# Patient Record
Sex: Male | Born: 1935 | Race: White | Hispanic: No | Smoking: Never smoker
Health system: Southern US, Community
[De-identification: ages and names within clinical notes are randomized; demographics above are authoritative.]

## PROBLEM LIST (undated history)

## (undated) DIAGNOSIS — E785 Hyperlipidemia, unspecified: Secondary | ICD-10-CM

## (undated) DIAGNOSIS — M869 Osteomyelitis, unspecified: Secondary | ICD-10-CM

## (undated) DIAGNOSIS — J189 Pneumonia, unspecified organism: Secondary | ICD-10-CM

## (undated) DIAGNOSIS — I639 Cerebral infarction, unspecified: Secondary | ICD-10-CM

## (undated) DIAGNOSIS — N183 Chronic kidney disease, stage 3 unspecified: Secondary | ICD-10-CM

## (undated) DIAGNOSIS — Z9889 Other specified postprocedural states: Secondary | ICD-10-CM

## (undated) DIAGNOSIS — Z13820 Encounter for screening for osteoporosis: Secondary | ICD-10-CM

## (undated) DIAGNOSIS — M1711 Unilateral primary osteoarthritis, right knee: Secondary | ICD-10-CM

## (undated) DIAGNOSIS — M51369 Other intervertebral disc degeneration, lumbar region without mention of lumbar back pain or lower extremity pain: Secondary | ICD-10-CM

## (undated) DIAGNOSIS — I701 Atherosclerosis of renal artery: Secondary | ICD-10-CM

## (undated) DIAGNOSIS — I739 Peripheral vascular disease, unspecified: Secondary | ICD-10-CM

## (undated) DIAGNOSIS — R0602 Shortness of breath: Secondary | ICD-10-CM

## (undated) DIAGNOSIS — F419 Anxiety disorder, unspecified: Secondary | ICD-10-CM

## (undated) DIAGNOSIS — I350 Nonrheumatic aortic (valve) stenosis: Secondary | ICD-10-CM

## (undated) DIAGNOSIS — I10 Essential (primary) hypertension: Secondary | ICD-10-CM

## (undated) DIAGNOSIS — D649 Anemia, unspecified: Secondary | ICD-10-CM

## (undated) DIAGNOSIS — M5136 Other intervertebral disc degeneration, lumbar region: Secondary | ICD-10-CM

## (undated) DIAGNOSIS — I4891 Unspecified atrial fibrillation: Secondary | ICD-10-CM

## (undated) DIAGNOSIS — R011 Cardiac murmur, unspecified: Secondary | ICD-10-CM

## (undated) DIAGNOSIS — G4730 Sleep apnea, unspecified: Secondary | ICD-10-CM

## (undated) HISTORY — DX: Osteomyelitis, unspecified: M86.9

## (undated) HISTORY — DX: Sleep apnea, unspecified: G47.30

## (undated) HISTORY — PX: SHOULDER SURGERY: SHX246

## (undated) HISTORY — PX: CARDIAC CATHETERIZATION: SHX172

## (undated) HISTORY — PX: LUMBAR SPINE SURGERY: SHX701

## (undated) HISTORY — DX: Unilateral primary osteoarthritis, right knee: M17.11

## (undated) HISTORY — DX: Atherosclerosis of renal artery: I70.1

## (undated) HISTORY — DX: Cerebral infarction, unspecified: I63.9

## (undated) HISTORY — DX: Other specified postprocedural states: Z98.890

## (undated) HISTORY — DX: Other intervertebral disc degeneration, lumbar region: M51.36

## (undated) HISTORY — DX: Nonrheumatic aortic (valve) stenosis: I35.0

## (undated) HISTORY — PX: TONSILLECTOMY: SUR1361

## (undated) HISTORY — DX: Chronic kidney disease, stage 3 (moderate): N18.3

## (undated) HISTORY — DX: Hyperlipidemia, unspecified: E78.5

## (undated) HISTORY — DX: Chronic kidney disease, stage 3 unspecified: N18.30

## (undated) HISTORY — DX: Encounter for screening for osteoporosis: Z13.820

## (undated) HISTORY — PX: APPENDECTOMY: SHX54

## (undated) HISTORY — DX: Peripheral vascular disease, unspecified: I73.9

## (undated) HISTORY — DX: Essential (primary) hypertension: I10

## (undated) HISTORY — DX: Other intervertebral disc degeneration, lumbar region without mention of lumbar back pain or lower extremity pain: M51.369

## (undated) HISTORY — DX: Unspecified atrial fibrillation: I48.91

---

## 1991-06-01 HISTORY — PX: THROAT SURGERY: SHX803

## 2003-11-03 ENCOUNTER — Other Ambulatory Visit: Payer: Self-pay

## 2005-04-07 ENCOUNTER — Ambulatory Visit: Payer: Self-pay | Admitting: Rheumatology

## 2005-04-15 ENCOUNTER — Ambulatory Visit: Payer: Self-pay | Admitting: *Deleted

## 2005-05-11 ENCOUNTER — Ambulatory Visit: Payer: Self-pay | Admitting: *Deleted

## 2005-10-29 DIAGNOSIS — Z9889 Other specified postprocedural states: Secondary | ICD-10-CM

## 2005-10-29 HISTORY — DX: Other specified postprocedural states: Z98.890

## 2005-11-23 ENCOUNTER — Ambulatory Visit: Payer: Self-pay | Admitting: Gastroenterology

## 2008-04-15 ENCOUNTER — Ambulatory Visit: Payer: Self-pay | Admitting: Family Medicine

## 2009-12-08 ENCOUNTER — Encounter: Admission: RE | Admit: 2009-12-08 | Discharge: 2009-12-08 | Payer: Self-pay | Admitting: Orthopaedic Surgery

## 2010-05-31 DIAGNOSIS — I639 Cerebral infarction, unspecified: Secondary | ICD-10-CM

## 2010-05-31 HISTORY — DX: Cerebral infarction, unspecified: I63.9

## 2010-07-01 DIAGNOSIS — Z13820 Encounter for screening for osteoporosis: Secondary | ICD-10-CM

## 2010-07-01 HISTORY — DX: Encounter for screening for osteoporosis: Z13.820

## 2010-10-03 ENCOUNTER — Emergency Department (HOSPITAL_COMMUNITY): Payer: Medicare Other

## 2010-10-03 ENCOUNTER — Inpatient Hospital Stay (HOSPITAL_COMMUNITY)
Admission: EM | Admit: 2010-10-03 | Discharge: 2010-10-08 | DRG: 065 | Disposition: A | Discharge status: Skilled Nursing Facility | Payer: Medicare Other | Attending: Neurology | Admitting: Neurology

## 2010-10-03 DIAGNOSIS — E669 Obesity, unspecified: Secondary | ICD-10-CM | POA: Diagnosis present

## 2010-10-03 DIAGNOSIS — E119 Type 2 diabetes mellitus without complications: Secondary | ICD-10-CM | POA: Diagnosis present

## 2010-10-03 DIAGNOSIS — N183 Chronic kidney disease, stage 3 unspecified: Secondary | ICD-10-CM | POA: Diagnosis present

## 2010-10-03 DIAGNOSIS — I739 Peripheral vascular disease, unspecified: Secondary | ICD-10-CM | POA: Diagnosis present

## 2010-10-03 DIAGNOSIS — M51379 Other intervertebral disc degeneration, lumbosacral region without mention of lumbar back pain or lower extremity pain: Secondary | ICD-10-CM | POA: Diagnosis present

## 2010-10-03 DIAGNOSIS — M5137 Other intervertebral disc degeneration, lumbosacral region: Secondary | ICD-10-CM | POA: Diagnosis present

## 2010-10-03 DIAGNOSIS — I359 Nonrheumatic aortic valve disorder, unspecified: Secondary | ICD-10-CM | POA: Diagnosis present

## 2010-10-03 DIAGNOSIS — I4891 Unspecified atrial fibrillation: Secondary | ICD-10-CM | POA: Diagnosis present

## 2010-10-03 DIAGNOSIS — I6529 Occlusion and stenosis of unspecified carotid artery: Secondary | ICD-10-CM | POA: Diagnosis present

## 2010-10-03 DIAGNOSIS — I129 Hypertensive chronic kidney disease with stage 1 through stage 4 chronic kidney disease, or unspecified chronic kidney disease: Secondary | ICD-10-CM | POA: Diagnosis present

## 2010-10-03 DIAGNOSIS — E870 Hyperosmolality and hypernatremia: Secondary | ICD-10-CM | POA: Diagnosis present

## 2010-10-03 DIAGNOSIS — I6340 Cerebral infarction due to embolism of unspecified cerebral artery: Principal | ICD-10-CM | POA: Diagnosis present

## 2010-10-03 DIAGNOSIS — Z7982 Long term (current) use of aspirin: Secondary | ICD-10-CM

## 2010-10-03 DIAGNOSIS — E785 Hyperlipidemia, unspecified: Secondary | ICD-10-CM | POA: Diagnosis present

## 2010-10-03 LAB — CBC
Hemoglobin: 11.5 g/dL — ABNORMAL LOW (ref 13.0–17.0)
MCV: 93.8 fL (ref 78.0–100.0)
Platelets: 281 10*3/uL (ref 150–400)
RDW: 15.3 % (ref 11.5–15.5)
WBC: 10.1 10*3/uL (ref 4.0–10.5)

## 2010-10-03 LAB — COMPREHENSIVE METABOLIC PANEL
AST: 15 U/L (ref 0–37)
Alkaline Phosphatase: 86 U/L (ref 39–117)
BUN: 52 mg/dL — ABNORMAL HIGH (ref 6–23)
CO2: 24 mEq/L (ref 19–32)
GFR calc Af Amer: 37 mL/min — ABNORMAL LOW (ref >60)
Potassium: 4.1 mEq/L (ref 3.5–5.1)
Total Bilirubin: 0.4 mg/dL (ref 0.3–1.2)
Total Protein: 7.7 g/dL (ref 6.0–8.3)

## 2010-10-03 LAB — POCT I-STAT, CHEM 8
BUN: 53 mg/dL — ABNORMAL HIGH (ref 6–23)
Chloride: 110 mEq/L (ref 96–112)
Creatinine, Ser: 2.5 mg/dL — ABNORMAL HIGH (ref 0.4–1.5)
Glucose, Bld: 82 mg/dL (ref 70–99)
HCT: 37 % — ABNORMAL LOW (ref 39.0–52.0)
Potassium: 4.1 mEq/L (ref 3.5–5.1)

## 2010-10-03 LAB — DIFFERENTIAL
Basophils Relative: 1 % (ref 0–1)
Eosinophils Absolute: 0.2 10*3/uL (ref 0.0–0.7)
Eosinophils Relative: 2 % (ref 0–5)
Lymphs Abs: 3.6 10*3/uL (ref 0.7–4.0)
Monocytes Relative: 8 % (ref 3–12)

## 2010-10-03 LAB — TROPONIN I: Troponin I: <0.3 ng/mL (ref <0.30)

## 2010-10-03 LAB — PROTIME-INR
INR: 0.99 (ref 0.00–1.49)
Prothrombin Time: 13.3 seconds (ref 11.6–15.2)

## 2010-10-03 LAB — CK TOTAL AND CKMB (NOT AT ARMC)
CK, MB: 1.9 ng/mL (ref 0.3–4.0)
Relative Index: INVALID (ref 0.0–2.5)

## 2010-10-04 ENCOUNTER — Inpatient Hospital Stay (HOSPITAL_COMMUNITY): Payer: Medicare Other

## 2010-10-04 DIAGNOSIS — I359 Nonrheumatic aortic valve disorder, unspecified: Secondary | ICD-10-CM

## 2010-10-04 LAB — URINALYSIS, ROUTINE W REFLEX MICROSCOPIC
Glucose, UA: NEGATIVE mg/dL
Protein, ur: 30 mg/dL — AB
Specific Gravity, Urine: 1.009 (ref 1.005–1.030)
pH: 6 (ref 5.0–8.0)

## 2010-10-04 LAB — GLUCOSE, CAPILLARY
Glucose-Capillary: 106 mg/dL — ABNORMAL HIGH (ref 70–99)
Glucose-Capillary: 103 mg/dL — ABNORMAL HIGH (ref 70–99)
Glucose-Capillary: 110 mg/dL — ABNORMAL HIGH (ref 70–99)

## 2010-10-04 LAB — LIPID PANEL
LDL Cholesterol: 163 mg/dL — ABNORMAL HIGH (ref 0–99)
Total CHOL/HDL Ratio: 5.8 RATIO
Triglycerides: 162 mg/dL — ABNORMAL HIGH (ref <150)

## 2010-10-04 LAB — URINE MICROSCOPIC-ADD ON

## 2010-10-04 LAB — HEMOGLOBIN A1C
Hgb A1c MFr Bld: 6.9 % — ABNORMAL HIGH (ref <5.7)
Mean Plasma Glucose: 151 mg/dL — ABNORMAL HIGH (ref <117)

## 2010-10-05 ENCOUNTER — Inpatient Hospital Stay (HOSPITAL_COMMUNITY): Payer: Medicare Other

## 2010-10-05 ENCOUNTER — Other Ambulatory Visit (HOSPITAL_COMMUNITY): Payer: Self-pay

## 2010-10-05 DIAGNOSIS — I4891 Unspecified atrial fibrillation: Secondary | ICD-10-CM

## 2010-10-05 DIAGNOSIS — I6340 Cerebral infarction due to embolism of unspecified cerebral artery: Secondary | ICD-10-CM

## 2010-10-05 LAB — CBC
HCT: 34.9 % — ABNORMAL LOW (ref 39.0–52.0)
Hemoglobin: 10.9 g/dL — ABNORMAL LOW (ref 13.0–17.0)
MCH: 29.6 pg (ref 26.0–34.0)
MCHC: 31.2 g/dL (ref 30.0–36.0)
MCV: 94.8 fL (ref 78.0–100.0)

## 2010-10-05 LAB — GLUCOSE, CAPILLARY

## 2010-10-06 ENCOUNTER — Inpatient Hospital Stay (HOSPITAL_COMMUNITY): Payer: Medicare Other

## 2010-10-06 ENCOUNTER — Other Ambulatory Visit (HOSPITAL_COMMUNITY): Payer: Self-pay

## 2010-10-06 LAB — BASIC METABOLIC PANEL
BUN: 34 mg/dL — ABNORMAL HIGH (ref 6–23)
BUN: 28 mg/dL — ABNORMAL HIGH (ref 6–23)
CO2: 23 mEq/L (ref 19–32)
Calcium: 9.2 mg/dL (ref 8.4–10.5)
Creatinine, Ser: 1.6 mg/dL — ABNORMAL HIGH (ref 0.4–1.5)
Creatinine, Ser: 1.61 mg/dL — ABNORMAL HIGH (ref 0.4–1.5)
GFR calc Af Amer: 51 mL/min — ABNORMAL LOW (ref >60)
GFR calc non Af Amer: 42 mL/min — ABNORMAL LOW (ref >60)
Glucose, Bld: 106 mg/dL — ABNORMAL HIGH (ref 70–99)
Glucose, Bld: 187 mg/dL — ABNORMAL HIGH (ref 70–99)

## 2010-10-06 LAB — GLUCOSE, CAPILLARY
Glucose-Capillary: 182 mg/dL — ABNORMAL HIGH (ref 70–99)
Glucose-Capillary: 241 mg/dL — ABNORMAL HIGH (ref 70–99)

## 2010-10-06 LAB — PROTIME-INR
INR: 1.31 (ref 0.00–1.49)
Prothrombin Time: 16.5 seconds — ABNORMAL HIGH (ref 11.6–15.2)

## 2010-10-07 LAB — GLUCOSE, CAPILLARY: Glucose-Capillary: 175 mg/dL — ABNORMAL HIGH (ref 70–99)

## 2010-10-08 ENCOUNTER — Encounter: Payer: Self-pay | Admitting: Internal Medicine

## 2010-10-08 LAB — PROTIME-INR
INR: 1.55 — ABNORMAL HIGH (ref 0.00–1.49)
Prothrombin Time: 18.8 seconds — ABNORMAL HIGH (ref 11.6–15.2)

## 2010-10-08 LAB — BASIC METABOLIC PANEL
BUN: 27 mg/dL — ABNORMAL HIGH (ref 6–23)
Chloride: 120 mEq/L — ABNORMAL HIGH (ref 96–112)
GFR calc non Af Amer: 43 mL/min — ABNORMAL LOW (ref >60)
Potassium: 4 mEq/L (ref 3.5–5.1)
Sodium: 152 mEq/L — ABNORMAL HIGH (ref 135–145)

## 2010-10-08 LAB — GLUCOSE, CAPILLARY
Glucose-Capillary: 165 mg/dL — ABNORMAL HIGH (ref 70–99)
Glucose-Capillary: 165 mg/dL — ABNORMAL HIGH (ref 70–99)
Glucose-Capillary: 116 mg/dL — ABNORMAL HIGH (ref 70–99)

## 2010-10-15 NOTE — Consult Note (Signed)
NAME:  Scott Norman, Scott Norman NO.:  [id number]  MEDICAL RECORD NO.:  [id number]           PATIENT TYPE:  I  LOCATION:  3020                         FACILITY:  [facility]  PHYSICIAN:  Marca Ancona, MD      DATE OF BIRTH:  28-Jun-1930  DATE OF CONSULTATION:  10/05/2010 DATE OF DISCHARGE:                                CONSULTATION   PRIMARY CARE PHYSICIAN:  Dr. Gwen Pounds at [facility] in [place], phone number [phone].  CARDIOLOGIST:  Dr. Gwen Pounds.  CHIEF COMPLAINT:  Atrial fib/flutter.  HISTORY OF PRESENT ILLNESS:  Scott Norman is a 77 year old male with a history of atrial fibrillation.  He was admitted as a code stroke on Oct 03, 2010, and received TPA.  He had some improvement in his symptoms.  On Oct 04, 2010, he was noted to go into atrial flutter and went back into sinus rhythm this a.m.  Cardiology was asked to evaluate him.  [name] is awake and alert.  He is not able to communicate well as he cannot consistently answer yes or no questions correctly.  He is able to move all of his extremities.  He gets frustrated with his inability to communicate.  Information was obtained from records as no family is present at this time.  PAST MEDICAL HISTORY: 1. History of atrial flutter, maintaining sinus rhythm when Dr.     Gwen Pounds saw him on July 24, 2010. 2. Moderate aortic stenosis. 3. Diabetes. 4. Hypertension. 5. Peripheral vascular disease with carotid atherosclerosis. 6. Degenerative joint disease. 7. Obesity. 8. Hyperlipidemia. 9. Chronic kidney disease, stage III.  SURGICAL HISTORY:  He has had shoulder surgery and was scheduled for back surgery, but cancelled this.  ALLERGIES:  He has been allergic or intolerant to ACE INHIBITORS with renal insufficiency as well as BETA-BLOCKERS, which caused bradycardia, and STATINS with no specific reaction given.  CURRENT MEDICATIONS: 1. Aspirin 300 mg p.r. daily. 2. Clonidine patch 0.1 weekly. 3.  DVT Lovenox. 4. Normal saline at 100 mL an hour.  SOCIAL HISTORY:  Lives in [place] with his wife.  He is self employed.  He has no history of alcohol, tobacco, or drug abuse noted.  FAMILY HISTORY:  Both of his parents are deceased and per Dr. Dr. Philemon Kingdom note, there is no family history of premature coronary artery disease.  REVIEW OF SYSTEMS:  Unobtainable secondary to the patient's condition.  PHYSICAL EXAMINATION:  VITAL SIGNS:  Temperature is 99.3, blood pressure 175/56, pulse 78, respiratory rate 21, O2 saturation 98% on room air. GENERAL:  He is a well-developed, well-nourished white male who moves all four extremities and responds to verbal stimulus. HEENT:  Normal with the exception of a possible minimal right facial droop. NECK:  There is no lymphadenopathy, thyromegaly, or JVD noted and he has a loud carotid bruit. CV:  His heart is regular in rate and rhythm with an S1 and S2 and 2 or 3/6 systolic murmur is noted.  Distal pulses are intact in all four extremities. LUNGS:  He has a few rales in the bases, but they are generally clear. SKIN:  No rashes  or lesions are noted. ABDOMEN:  Soft and nontender with active bowel sounds. EXTREMITIES:  There is no cyanosis, clubbing, or edema noted. MUSCULOSKELETAL:  There is no joint deformity or effusions. NEURO:  He is alert and appears able to respond to questions, but answers are not consistently accurate.  Strength is equal bilaterally.  Chest x-ray on a Oct 03, 2010, showed mild bibasilar atelectasis.  CT of the head shows an infarct in the left MCA distribution with no hemorrhage.  EKG is sinus rhythm, rate 72 with frequent PVCs and right bundle-branch block as well as a significant first-degree AV block at 296 milliseconds.  LABORATORY VALUES:  Hemoglobin 10.9, hematocrit 34.9, WBC is 10.3, platelets 257.  Sodium 143, potassium 3.2, chloride 109, CO2 of 23, BUN 34, creatinine 1.6, glucose 106.  Hemoglobin A1c  6.9.  CK-MB and troponin-I negative x1.  Total cholesterol 236, triglycerides 162, HDL 41, LDL 163.  2-D echocardiogram showed an EF 55% to 60% with mild AS and MR, valve area of 0.72 cm2 by V-max, mean gradient 23, peak gradient 39, mild mitral valvular regurgitation with a peak gradient of 9 mm and PAS of 38.  IMPRESSION:  Scott Norman was seen today by Dr. Shirlee Latch, the patient evaluated and the data reviewed.  He is status post t-PA for acute cerebrovascular accident who is now aphasic.  We suspect a cardioembolic cerebrovascular accident from atrial fibrillation. 1. Atrial fibrillation:  It is paroxysmal.  The rate is not fast.     Given his long PR interval at baseline and some evidence for sinus     node exit block with dropped beat, we would avoid nodal blockade     for now. 2. Anticoagulation:  He should be started on Coumadin. 3. Cerebrovascular accident:  We suspect cardioembolic and he also has     left greater than right carotid bruits.  Per Dr. Dr. Philemon Kingdom     records, Dopplers performed in 2010 showed less than 50% bilateral     internal carotid artery stenosis.  Carotid Dopplers have been     ordered and we will follow up on the results. 4. Hypertension:  The neuro team was contacted and they feel it is     appropriate to restart his amlodipine at 5 mg daily and discontinue     the clonidine, which has the potential to cause bradycardia.     Systolic blood pressure goals are to be addressed by the neuro     team. 5. Hyperlipidemia:  His goal LDL is now less than 70.  Per Dr. Dr.     Philemon Kingdom records, he had intolerance to     statins previously with weakness and fatigue.  We will start with     low-dose Zocor at 10 mg daily and see how he tolerates it. 6. Aortic stenosis:  It is mild by echo and we will continue to follow     him for this.     Theodore Demark, PA-C   ______________________________ Marca Ancona, MD    RB/MEDQ  D:  10/05/2010  T:   10/05/2010  Job:  [id number]  Electronically Signed by Theodore Demark PA-C on 10/09/2010 03:45:22 PM Electronically Signed by Marca Ancona MD on 10/15/2010 11:49:09 PM

## 2010-10-28 NOTE — Discharge Summary (Signed)
[name], [name]                ACCOUNT NO.:  [id number]  MEDICAL RECORD NO.:  [id number]           PATIENT TYPE:  I  LOCATION:  3020                         FACILITY:  [facility]  PHYSICIAN:  Pramod P. Pearlean Brownie, MD    DATE OF BIRTH:  April 22, 1931  DATE OF ADMISSION:  10/03/2010 DATE OF DISCHARGE:  10/08/2010                        DISCHARGE SUMMARY - REFERRING   DIAGNOSES AT TIME OF DISCHARGE: 1. Left MCA branch infarct cardioembolic secondary to atrial     fibrillation status post full-dose IV t-PA. 2. Atrial fibrillation, new Coumadin, not yet therapeutic. 3. Hypertension. 4. Hypernatremia. 5. Chronic kidney disease stage III. 6. Moderate aortic stenosis. 7. Diabetes. 8. Peripheral vascular disease with carotid atherosclerosis. 9. Degenerative joint disease. 10.Obesity. 11.Hyperlipidemia. 12.Degenerative disease of lumbar spine, underwent back surgery about     6 weeks ago and apparently has done well.  MEDICINES AT TIME OF DISCHARGE: 1. Amlodipine 10 mg a day. 2. Aspirin 325 mg a day until INR greater than or equal to 2 then     stop. 3. Metoprolol 25 mg b.i.d. 4. Simvastatin 20 mg a day. 5. Warfarin 5 mg a day.  Adjust dose for goal INR 2.0-3.0. 6. Allopurinol 300 mg a day. 7. Clonidine 0.1 mg at bedtime. 8. Colchicine 0.6 mg a day. 9. Lasix 20 mg b.i.d. 10.Gemfibrozil 600 mg b.i.d. 11.Glipizide 10 mg 1 tablet a day.  STUDIES PERFORMED: 1. CT of the brain on admission showed no acute abnormality.  Mild     diffuse cerebral atrophy.  Follow up CT 24 hours after t-PA shows     changes consistent with evolving acute infarct in the distribution     of the left middle cerebral artery.  No hemorrhage. 2. MRI of the brain not performed.  The patient unable to cooperate. 3. CT angio of the brain not performed, the patient unable to     cooperate. 4. Chest x-ray shows low lung volumes and mild bibasilar atelectasis. 5. A 2-D echocardiogram shows EF of 55-60% with moderate  stenosis,     aortic valve. 6. Carotid Doppler shows no ICA stenosis. 7. Transcranial Doppler shows elevated left middle cerebral mean flow     velocity suggest mild stenosis.  Globally elevated pulsatility     indices suggest diffuse intracranial atherosclerosis. 8. EKG shows sinus rhythm with first-degree AV block with frequent     PVCs.  Right axis deviation.  Nonspecific ventricular conduction     block.  LABORATORY STUDIES:  Day of discharge; sodium 152, potassium 4.0, chloride 120, CO2 of 18, glucose 159, BUN 27, creatinine 1.56, GFR 43, calcium 10.0, INR 1.55 with a PT of 18.8.  CBC with hemoglobin 10.9, hematocrit 34.9, otherwise normal.  Hemoglobin A1c 6.9.  Cholesterol 236, triglycerides 162, HDL 41, LDL 163.  Urinalysis with 3-6 white blood cell, 0-2 red blood cells, rare squamous epithelial, rare bacteria.  MRSA screening was negative and cardiac enzymes on admission were normal.  HISTORY OF PRESENT ILLNESS:  [name] is a 77 year old right- handed Caucasian male with a history of diabetes and hypertension, who was noticed by his wife at 7:30 p.m.  to appear confused.  The patient was unable to speak.  There was a slight droop on the right side of his face, but no obvious weakness of his arm or legs.  The patient was able to strap on his back brace with no difficulty.  There was no indication he had difficulty walking.  His glucose was 82.  EMS was called and brought to [facility].  His NIH stroke scale was 5.  CT of the brain showed no acute abnormality.  The patient was deemed to be a candidate for t-PA and he was given full-dose IV t-PA.  He had immediate improvement in spoken commands, but decreased output.  He was admitted to the neuro ICU for further evaluation.  HOSPITAL COURSE:  The patient was able to tolerate t-PA without difficulty.  Follow up CT at 24 hours showed no acute hemorrhage, but did show a left brain stroke.  The patient with  significant receptive and expressive aphasia noted as well as swallowing difficulty.  He was placed on a dysphagia I honey-thick liquid diet.  In hospital, the patient converted to atrial flutter, as he was sinus rhythm on admission.  In looking at the patient's history, the patient does have a history of atrial flutter, though not present on a recent monitoring. He is followed by cardiology.  He has been on Coumadin in the past, but this was stopped as there was no indication for warfarin use.  As he has now had a stroke and atrial fibrillation, the patient was resumed on Coumadin.  Cardiology was consulted.  They feel he may benefit from cardioversion in the future.  They are agreeable with Coumadin for secondary stroke prevention and ongoing blood pressure management.  Low- dose beta-blocker was added by them and they increased amlodipine.  At the time of discharge, discussed case with Dr. Antoine Poche and will resume Lasix at half the dose at discharge, as he has abnormal electrolytes at the time of discharge.  We will ask skilled nursing facility to follow up lab and medication tolerance.  The patient was evaluated by PT, OT, and speech therapy.  The patient was too high level for inpatient rehab at [facility].  Initial plans were to discharge the patient home with the patient's wife with home health therapies.  The patient's children were concerned with plan. Social worker met with family and decision was made to discharge the patient to skilled nursing facility for ongoing therapy with further care determination to be made at that time.  Of note, the patient's wife is not the mother of the children.  CONDITION AT TIME OF DISCHARGE:  The patient is alert.  He is aphasic and essentially mute.  He has no ability to follow commands on a consistent basis.  He has no weakness in his extremities and is able to walk with the therapist with his back brace on.  DISCHARGE PLAN: 1. Discharge  to [facility] for ongoing PT, OT, and speech therapy. 2. Dysphagia I honey-thick liquid diet. 3. Coumadin for secondary stroke prevention.  Goal INR 2.0-3.0.  The     patient needs daily PT/INR checks until therapeutic. 4. Aspirin 325 mg daily as a bridge to Coumadin.  Once INR     therapeutic, please stop Coumadin. 5. Chemistry in 7 days to evaluate potassium, sodium, and creatinine. 6. Ongoing monitoring/evaluation/treatment with the patient's     cardiologist and primary care physician in [place]. 7. Follow up with Dr. Delia Heady in his office  in 1-2 months.     Annie Main, N.P.   ______________________________ Sunny Schlein. Pearlean Brownie, MD    SB/MEDQ  D:  10/08/2010  T:  10/08/2010  Job:  [id number]  cc:   Pramod P. Pearlean Brownie, MD [facility] Cardiology Jonna L. Robb Matar, M.D. Dr. Gwen Pounds  Electronically Signed by Annie Main N.P. on 10/15/2010 11:26:37 AM Electronically Signed by Delia Heady MD on 10/28/2010 09:44:02 PM

## 2010-10-29 ENCOUNTER — Ambulatory Visit: Payer: Self-pay | Admitting: Internal Medicine

## 2010-10-30 ENCOUNTER — Encounter: Payer: Self-pay | Admitting: Internal Medicine

## 2010-11-16 ENCOUNTER — Encounter: Payer: Self-pay | Admitting: Family Medicine

## 2010-11-20 ENCOUNTER — Encounter: Payer: Self-pay | Admitting: Cardiology

## 2010-11-20 ENCOUNTER — Ambulatory Visit (INDEPENDENT_AMBULATORY_CARE_PROVIDER_SITE_OTHER): Payer: Medicare Other | Admitting: Cardiology

## 2010-11-20 DIAGNOSIS — R0602 Shortness of breath: Secondary | ICD-10-CM

## 2010-11-20 DIAGNOSIS — I350 Nonrheumatic aortic (valve) stenosis: Secondary | ICD-10-CM

## 2010-11-20 DIAGNOSIS — I10 Essential (primary) hypertension: Secondary | ICD-10-CM

## 2010-11-20 DIAGNOSIS — I4891 Unspecified atrial fibrillation: Secondary | ICD-10-CM

## 2010-11-20 DIAGNOSIS — I4430 Unspecified atrioventricular block: Secondary | ICD-10-CM

## 2010-11-20 DIAGNOSIS — N189 Chronic kidney disease, unspecified: Secondary | ICD-10-CM

## 2010-11-20 DIAGNOSIS — I359 Nonrheumatic aortic valve disorder, unspecified: Secondary | ICD-10-CM

## 2010-11-20 DIAGNOSIS — E785 Hyperlipidemia, unspecified: Secondary | ICD-10-CM

## 2010-11-20 DIAGNOSIS — R001 Bradycardia, unspecified: Secondary | ICD-10-CM

## 2010-11-20 LAB — BASIC METABOLIC PANEL
BUN: 40 mg/dL — ABNORMAL HIGH (ref 6–23)
Chloride: 108 mEq/L (ref 96–112)
Potassium: 4.2 mEq/L (ref 3.5–5.3)
Sodium: 142 mEq/L (ref 135–145)

## 2010-11-20 MED ORDER — HYDRALAZINE HCL 25 MG PO TABS: 37.5000 mg | ORAL_TABLET | Freq: Three times a day (TID) | ORAL | Status: DC

## 2010-11-20 MED ORDER — OMEGA-3 FATTY ACIDS 1000 MG PO CAPS: 2.0000 g | ORAL_CAPSULE | Freq: Every day | ORAL | Status: DC

## 2010-11-20 NOTE — Patient Instructions (Signed)
Your physician has recommended you make the following change in your medication: STOP Coreg. START Hydralazine 25mg  1 1/2 tablets 3 times a day. START Fish oil 1000mg  2 tab daily.  TO BE SCHEDULED MAY 2013: Your physician has requested that you have an echocardiogram. Echocardiography is a painless test that uses sound waves to create images of your heart. It provides your doctor with information about the size and shape of your heart and how well your heart's chambers and valves are working. This procedure takes approximately one hour. There are no restrictions for this procedure.  Your physician recommends that you return for a FASTING lipid profile: Lipid/Lft in 1 month  We will call you with results of labwork done today.   Your physician has recommended that you wear a holter monitor. Holter monitors are medical devices that record the heart's electrical activity. Doctors most often use these monitors to diagnose arrhythmias. Arrhythmias are problems with the speed or rhythm of the heartbeat. The monitor is a small, portable device. You can wear one while you do your normal daily activities. This is usually used to diagnose what is causing palpitations/syncope (passing out).  Your physician has requested that you regularly monitor and record your blood pressure readings at home. Please use the same machine at the same time of day to check your readings and record them to bring to your follow-up visit.

## 2010-11-21 DIAGNOSIS — E785 Hyperlipidemia, unspecified: Secondary | ICD-10-CM | POA: Insufficient documentation

## 2010-11-21 DIAGNOSIS — N189 Chronic kidney disease, unspecified: Secondary | ICD-10-CM | POA: Insufficient documentation

## 2010-11-21 DIAGNOSIS — I350 Nonrheumatic aortic (valve) stenosis: Secondary | ICD-10-CM | POA: Insufficient documentation

## 2010-11-21 DIAGNOSIS — R001 Bradycardia, unspecified: Secondary | ICD-10-CM | POA: Insufficient documentation

## 2010-11-21 DIAGNOSIS — I10 Essential (primary) hypertension: Secondary | ICD-10-CM | POA: Insufficient documentation

## 2010-11-21 DIAGNOSIS — I4891 Unspecified atrial fibrillation: Secondary | ICD-10-CM | POA: Insufficient documentation

## 2010-11-21 LAB — BRAIN NATRIURETIC PEPTIDE: Brain Natriuretic Peptide: 906.4 pg/mL — ABNORMAL HIGH (ref 0.0–100.0)

## 2010-11-21 NOTE — Assessment & Plan Note (Signed)
LDL was 163 in 5/12.  He was started on simvastatin in the hospital.  Will get lipids/LFTs in 1 month.

## 2010-11-21 NOTE — Assessment & Plan Note (Signed)
Will check BMET today  

## 2010-11-21 NOTE — Assessment & Plan Note (Signed)
BP is high today.  He needs good BP control.  Options are limited: holding off on ACEI/ARB with chronic kidney disease (last creatinine 1.56).  Holding off on nodal blockers with bradycardia/heart block.  He is on amlodipine 10 mg daily and clonidine 0.1 mg qhs.  I would like to avoid titrating up clonidine if possible.  I will instead start him on hydralazine 37.5 mg tid.  This can be titrated up as needed. We will call him for a blood pressure check in 2 wks.

## 2010-11-21 NOTE — Assessment & Plan Note (Signed)
Mild bradycardia with profound 1st degree AV block and RBBB.  Significant conduction system disease.  No symptomatic bradycardia.  I will get a 48 hour holter monitor to assess for any long pauses, episodes of higher degree heart block, etc.

## 2010-11-21 NOTE — Assessment & Plan Note (Signed)
Paroxysmal atrial fibrillation.  He is in sinus rhythm today.  Given profound 1st degree AV block as well as RBBB, I am going to have him stop carvedilol.  He will remain on coumadin (will avoid rivaroxaban and dabigatran given chronic kidney disease).  If he needs to come off coumadin for any reason in the future, he will need bridging heparin or Lovenox given history of stroke.

## 2010-11-21 NOTE — Assessment & Plan Note (Signed)
Mild to moderate aortic stenosis.  Repeat echo in 5/13.

## 2010-11-21 NOTE — Progress Notes (Signed)
PCP: Dr. Terance Hart  Unfortunate 77 yo with history of paroxysmal atrial fibrillation, CKD, and mild to moderate aortic stenosis presents to establish cardiology care after recent hospitalization for large cardioembolic stroke.  Patient was admitted to [facility] in 5/12 with a left MCA cardioembolic stroke.  He was treated with tPA.  He was noted to be in atrial fibrillation on and off while in the hospital.  When in sinus rhythm, he had a profound 1st degree AV block.  He had profound expressive aphasia as a result of the stroke.  He was discharged to rehab initially but is back home now.  He has been noted to have atrial fibrillation in the past and at some point was on coumadin but had been told he could stop it.  He is back on coumadin now.    He still has expressive aphasia but has had some improvement.   Patient walks with a cane.  He denies exertional dyspnea walking on flat ground or climbing a flight of steps.  No chest pain.  Today, he is in sinus rhythm with a profound 1st degree AV block.  He denies lightheadedness or syncope.   ECG: NSR, RBBB, 1st degree AV block at 51, PVC  Labs (5/12): K 4.0, Na 152, creatinine 1.56, LDL 163  PMH: 1. Gout 2. HTN 3. Diabetes mellitus type II 4. Hyperlipidemia 5. Atrial fibrillation: paroxysmal.  Echo (5/12) with EF 55-60%, moderate LV hypertrophy, mild to moderate AS (mean gradient 23/peak 39), mild mitral regurgitation, PA systolic pressure 38 mmHg.  Patient had a L MCA cardioembolic stroke while not anticoagulated.  6. Left MCA cardioembolic stroke (4/62): In setting of atrial fibrillation.  Prominent aphasia.  Was not anticoagulated at the time.  7. CKD: History of ARF with ACEI.  8. Aortic stenosis: Mild to moderate on 5/12 echo.  9. Degenerative disc disease s/p L-spine surgery.  10. Carotid stenosis: However, carotid dopplers in 5/12 did not show significant disease.    SH: Lives with wife in [place].  No ETOH or tobacco.  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    FH: No premature CAD.   ROS: All systems reviewed and negative except as per HPI.   Current Outpatient Prescriptions  Medication Sig Dispense Refill  . allopurinol (ZYLOPRIM) 300 MG tablet Take 300 mg by mouth daily.        Marland Kitchen amLODipine (NORVASC) 10 MG tablet Take 10 mg by mouth daily.        . cloNIDine (CATAPRES) 0.1 MG tablet Take 0.1 mg by mouth at bedtime.        . colchicine 0.6 MG tablet Take 0.6 mg by mouth daily.        . diclofenac sodium (VOLTAREN) 1 % GEL Apply 1 application topically 4 (four) times daily.        . furosemide (LASIX) 20 MG tablet Take 20 mg by mouth 2 (two) times daily.        Marland Kitchen gemfibrozil (LOPID) 600 MG tablet Take 600 mg by mouth 2 (two) times daily before a meal.        . glipiZIDE (GLUCOTROL) 10 MG 24 hr tablet Take 5 mg by mouth daily.       . simvastatin (ZOCOR) 20 MG tablet Take 20 mg by mouth at bedtime.        Marland Kitchen warfarin (COUMADIN) 5 MG tablet Take 5 mg by mouth daily. Adjust dose for goal INR 2.0-3.0       .  fish oil-omega-3 fatty acids 1000 MG capsule Take 2 capsules (2 g total) by mouth daily.  30 capsule  0  . hydrALAZINE (APRESOLINE) 25 MG tablet Take 1.5 tablets (37.5 mg total) by mouth 3 (three) times daily.  120 tablet  6    BP 149/70  Pulse 57  Ht 6' (1.829 m)  Wt 224 lb (101.606 kg)  BMI 30.38 kg/m2 General: NAD Neck: JVP 7-8 cm, no thyromegaly or thyroid nodule.  Lungs: Clear to auscultation bilaterally with normal respiratory effort. CV: Nondisplaced PMI.  Heart regular S1/S2, no S3/S4, no murmur.  1+ edema 3/4 up bilaterally.  No carotid bruit.  Normal pedal pulses.  Abdomen: Soft, nontender, no hepatosplenomegaly, no distention.  Neurologic: Alert and oriented x 3.  Psych: Normal affect. Extremities: No clubbing or cyanosis.

## 2010-11-24 ENCOUNTER — Other Ambulatory Visit: Payer: Self-pay | Admitting: Cardiology

## 2010-11-24 ENCOUNTER — Telehealth: Payer: Self-pay | Admitting: *Deleted

## 2010-11-24 DIAGNOSIS — R001 Bradycardia, unspecified: Secondary | ICD-10-CM

## 2010-11-24 DIAGNOSIS — I350 Nonrheumatic aortic (valve) stenosis: Secondary | ICD-10-CM

## 2010-11-24 DIAGNOSIS — N189 Chronic kidney disease, unspecified: Secondary | ICD-10-CM

## 2010-11-24 DIAGNOSIS — I4430 Unspecified atrioventricular block: Secondary | ICD-10-CM

## 2010-11-24 NOTE — Telephone Encounter (Signed)
He was on hydralazine and lasix both prior to last appointment.  I did not make much change in the overall doses.  It is possible that he could have a gout flare given use of Lasix.  Is his knee red/warm? Usually statin causes muscle and not joint pain.  He can try coenzyme  Q 10 200 mg daily to see if it helps.  If pain continues, needs to have knees evaluated.

## 2010-11-24 NOTE — Telephone Encounter (Signed)
Spoke to pt's daughter, gave her pt's lab results and notified of incr in lasix from 20bid to 40 once daily. Pt will return in 10 days for repeat BMP. She stated that pt has c/o severe joint pain (mostly in knees) and is asking if this could be related to Hydralazine. Pt was started on hydralazine 25mg  1 1/2 tab tid at last ov and she states this is when symptoms began. Pt is also on Simvastatin 20mg  daily, she thinks pt started this around May 2012. Notified her that I was not aware of Hydralazine causing joint pain, could possibly be his simva, will forward to Dr. Shirlee Latch for recommendation.

## 2010-11-25 ENCOUNTER — Other Ambulatory Visit: Payer: Self-pay | Admitting: Cardiology

## 2010-11-25 ENCOUNTER — Encounter: Payer: Self-pay | Admitting: *Deleted

## 2010-11-25 DIAGNOSIS — N189 Chronic kidney disease, unspecified: Secondary | ICD-10-CM

## 2010-11-25 DIAGNOSIS — I350 Nonrheumatic aortic (valve) stenosis: Secondary | ICD-10-CM

## 2010-11-25 NOTE — Telephone Encounter (Signed)
Spoke to pt's daughter, she found out that it was Crestor, not lipitor that caused problem in past. Pt will try CoQ10 first, then if continues to have knee pain, will hold simva x 1 week and will call back with results. Will follow instructions below and pt is scheduled to have bloodwork next week.

## 2010-11-25 NOTE — Telephone Encounter (Signed)
Spoke to pt's daughter, she states that pt's knees are not red or warm to touch and he has been taking Allopurinol and Colchicine for a while now. She will have pt see pcp for gout eval if pain worsens. Pt has a history of problems with statins, Dr. Terance Hart put him on Lipitor in past and pt could barely walk, and symptoms improved after stopping Lipitor. Pt's daughter states she will have him try CoQ 10, but would like to know if we can change to different med with his history? Please advise.  Pt's daughter asking if pt is safe to take Septra, given by pcp for skin boil. Advised that pt make sure they are watching his INR closely during and after taking since he is on Coumadin. Dr. Terance Hart follows this, and daughter would like for [place] to start seeing him for INR checks. Advised that pt have next check with PCP since on abx and will set him up for next visit here for new coumadin pt. She is ok with this and will call back to schedule.

## 2010-11-25 NOTE — Telephone Encounter (Signed)
Would try coq10 1st.  If this does not help, stop simva x 1 week to see if symptoms better.  If symptoms better, would start pravastatin 40 mg daily with coq10 (prava is less likely to cause pain but is not as strong).

## 2010-11-29 ENCOUNTER — Encounter: Payer: Self-pay | Admitting: Family Medicine

## 2010-11-29 ENCOUNTER — Encounter: Payer: Self-pay | Admitting: Internal Medicine

## 2010-11-30 ENCOUNTER — Telehealth: Payer: Self-pay | Admitting: *Deleted

## 2010-11-30 NOTE — Telephone Encounter (Signed)
Received fax today from daughter. BP readings from 6/25- 160/83, 6/26 144/67,160/72, 6/27 @0830  49/79, @1200  126/61, 6/28 115/69, 112/55, 6/30  0800 76/45, 5:10PM 144/64, 7/1 115/61,7/2 104/56.

## 2010-11-30 NOTE — Telephone Encounter (Signed)
Spoke to pt's daughter this AM to get BP numbers for past week. She will call later or tomorrow with numbers when she has his list. Pt will f/u this Fri for labs. Daughter did say that he did have one reading of 76/59, and one episode of pt not "feeling well," and she is wondering if his BP machine is accurate, as he uses the wrist machine. Advised daughter to bring that machine in Friday and we will check accuracy, and to call us sooner if BP continues to be low and if his symptoms change, or continues to "feel bad."   Also, pt did hold simva for 1 week and his pain in knees has completely resolved. Per note advised we start Pravastatin. Daughter wants to wait for chol results first then decide; pt is also taking fish oil 2g/day and Lopid 600mg  bid; he did not ever start CoQ10.   Daughter also wanted Dr. Shirlee Latch to review pt's medlist to determine if everything is necessary as Dr. Dareen Piano was the one who started him on most of his meds. Pt just had ov with DM and notified daughter would send msg, but he would have reviewed at visit, and/or can discuss any meds at his f/u visit 12/2010. She is ok with this.  Daughter will be in contact with me about starting coumadin visits here, she has requested records to be sent here. Dr. Terance Hart is following at this time and monitoring closer now while pt is on Sulfa.

## 2010-12-02 NOTE — Telephone Encounter (Signed)
Would not change his BP meds at this time.  Suspect that some of the readings (the extremely low ones) are likely spurious.  Would start pravastatin with coenzyme Q10 200 mg daily as advised before regardless of cholesterol value as we are trying to keep plaque out of his arteries.  Would be fine to monitor coumadin at our office.

## 2010-12-03 ENCOUNTER — Other Ambulatory Visit: Payer: Self-pay | Admitting: Cardiology

## 2010-12-03 MED ORDER — PRAVASTATIN SODIUM 40 MG PO TABS: 40.0000 mg | ORAL_TABLET | Freq: Every evening | ORAL | Status: DC

## 2010-12-03 MED ORDER — COENZYME Q10 200 MG PO TABS: 200.0000 mg | ORAL_TABLET | Freq: Every day | ORAL | Status: DC

## 2010-12-03 NOTE — Telephone Encounter (Signed)
Spoke to pt's daughter, Ophelia Shoulder, notified of msg below. She will have pt change to Pravastatin 40mg  daily along with CoQ10 200mg  daily. Pt will call with any problems regarding side effects. Pt due to come in tomorrow AM for labs, and we will schedule pt for coumadin clinic for the future. We will also check pt's BP along with his machine tomorrow.

## 2010-12-04 ENCOUNTER — Other Ambulatory Visit: Payer: Medicare Other | Admitting: *Deleted

## 2010-12-04 ENCOUNTER — Encounter: Payer: Self-pay | Admitting: *Deleted

## 2010-12-04 ENCOUNTER — Other Ambulatory Visit (INDEPENDENT_AMBULATORY_CARE_PROVIDER_SITE_OTHER): Payer: Medicare Other | Admitting: *Deleted

## 2010-12-04 VITALS — BP 162/50 | HR 62

## 2010-12-04 DIAGNOSIS — N189 Chronic kidney disease, unspecified: Secondary | ICD-10-CM

## 2010-12-04 DIAGNOSIS — Z7901 Long term (current) use of anticoagulants: Secondary | ICD-10-CM

## 2010-12-04 DIAGNOSIS — E785 Hyperlipidemia, unspecified: Secondary | ICD-10-CM

## 2010-12-04 DIAGNOSIS — I4891 Unspecified atrial fibrillation: Secondary | ICD-10-CM

## 2010-12-04 NOTE — Progress Notes (Signed)
Pt's BP today was checked along with his own wrist BP cuff he uses at home. His cuff gave results of 136/68, manual taken by RN was 162/50. Advised pt get an arm cuff because his is off by over 30points. Pt's daughter with him and states she will do this.

## 2010-12-07 ENCOUNTER — Other Ambulatory Visit: Payer: Self-pay | Admitting: Cardiology

## 2010-12-07 DIAGNOSIS — E785 Hyperlipidemia, unspecified: Secondary | ICD-10-CM

## 2010-12-10 ENCOUNTER — Telehealth: Payer: Self-pay | Admitting: Cardiovascular Disease

## 2010-12-10 NOTE — Telephone Encounter (Signed)
Spoke to pt's daughter, she says it is only one knee and he is going to have checked by pcp. Just wanted to notify Dr. Shirlee Latch that pt has never started pravastatin 40 after we suggested he switch from simva. Pt's daughter said he refuses to take any more statins regardless of recommendation. Pt had labs drawn at pcp today, we should receive results tomorrow. Just FYI.

## 2010-12-10 NOTE — Telephone Encounter (Signed)
Statin is making that patients knees sore.

## 2010-12-11 ENCOUNTER — Telehealth: Payer: Self-pay | Admitting: *Deleted

## 2010-12-11 DIAGNOSIS — R7989 Other specified abnormal findings of blood chemistry: Secondary | ICD-10-CM

## 2010-12-11 NOTE — Telephone Encounter (Signed)
Attempted to contact pt and pt's daughter regarding lab results. Creat up from 2.26 to 3.2. He is taking lasix 40mg  daily. His potassium is 5.2, I tried to contact his daughter (she monitors his meds) to find out if pt is on potassium, I do not see on medlist. Pt's daughter to call back.   Dr. Shirlee Latch, please review labs in pt's chart, I had them scanned but do not think they were sent to you. Thanks.

## 2010-12-13 NOTE — Telephone Encounter (Signed)
He needs to stop Lasix.  Repeat BMET in a week.

## 2010-12-14 ENCOUNTER — Telehealth: Payer: Self-pay | Admitting: *Deleted

## 2010-12-14 DIAGNOSIS — N189 Chronic kidney disease, unspecified: Secondary | ICD-10-CM | POA: Insufficient documentation

## 2010-12-14 NOTE — Telephone Encounter (Signed)
Spoke to pt's daughter, [name], notified to have pt hold Lasix. He has taken today, so he will hold tomorrow. Pt was at acute care on Friday when I was trying to contact pt, daughter said he was having problems with low blood sugar. Pt's daughter notified that this could be r/t his elevated K+, he does not take supplement, also recommended that he decr any in his diet as well. Pt will return in 1 week for repeat BMET.

## 2010-12-16 ENCOUNTER — Other Ambulatory Visit: Payer: Medicare Other | Admitting: *Deleted

## 2010-12-16 ENCOUNTER — Ambulatory Visit (INDEPENDENT_AMBULATORY_CARE_PROVIDER_SITE_OTHER): Payer: Medicare Other | Admitting: Emergency Medicine

## 2010-12-16 DIAGNOSIS — Z7901 Long term (current) use of anticoagulants: Secondary | ICD-10-CM

## 2010-12-16 DIAGNOSIS — I4891 Unspecified atrial fibrillation: Secondary | ICD-10-CM

## 2010-12-16 DIAGNOSIS — I639 Cerebral infarction, unspecified: Secondary | ICD-10-CM | POA: Insufficient documentation

## 2010-12-17 NOTE — Telephone Encounter (Signed)
Opened in error

## 2010-12-21 ENCOUNTER — Ambulatory Visit (INDEPENDENT_AMBULATORY_CARE_PROVIDER_SITE_OTHER): Payer: Medicare Other | Admitting: Cardiovascular Disease

## 2010-12-21 ENCOUNTER — Encounter: Payer: Self-pay | Admitting: Cardiovascular Disease

## 2010-12-21 ENCOUNTER — Other Ambulatory Visit: Payer: Medicare Other | Admitting: *Deleted

## 2010-12-21 VITALS — BP 162/70 | HR 66 | Ht 72.0 in | Wt 236.0 lb

## 2010-12-21 DIAGNOSIS — I350 Nonrheumatic aortic (valve) stenosis: Secondary | ICD-10-CM

## 2010-12-21 DIAGNOSIS — I4891 Unspecified atrial fibrillation: Secondary | ICD-10-CM

## 2010-12-21 DIAGNOSIS — R609 Edema, unspecified: Secondary | ICD-10-CM

## 2010-12-21 DIAGNOSIS — E785 Hyperlipidemia, unspecified: Secondary | ICD-10-CM

## 2010-12-21 DIAGNOSIS — Z79899 Other long term (current) drug therapy: Secondary | ICD-10-CM

## 2010-12-21 DIAGNOSIS — I10 Essential (primary) hypertension: Secondary | ICD-10-CM

## 2010-12-21 DIAGNOSIS — R0602 Shortness of breath: Secondary | ICD-10-CM

## 2010-12-21 DIAGNOSIS — I359 Nonrheumatic aortic valve disorder, unspecified: Secondary | ICD-10-CM

## 2010-12-21 DIAGNOSIS — N189 Chronic kidney disease, unspecified: Secondary | ICD-10-CM

## 2010-12-21 MED ORDER — CLONIDINE HCL 0.2 MG PO TABS: 0.2000 mg | ORAL_TABLET | Freq: Every day | ORAL | Status: DC

## 2010-12-21 MED ORDER — HYDRALAZINE HCL 100 MG PO TABS: 100.0000 mg | ORAL_TABLET | Freq: Four times a day (QID) | ORAL | Status: DC

## 2010-12-21 MED ORDER — FUROSEMIDE 80 MG PO TABS: 80.0000 mg | ORAL_TABLET | Freq: Two times a day (BID) | ORAL | Status: DC

## 2010-12-21 NOTE — Assessment & Plan Note (Signed)
Per the patient, his edema is worse. His weight is also elevated concerning for fluid retention in the setting of renal dysfunction. We will increase his Lasix to 80 mg b.i.d. And we'll closely monitor his weight. We will check a basic metabolic panel today as well as BNP.  We will hold his amlodipine as this can contribute to edema and will increase his other blood pressure medications to compensate.

## 2010-12-21 NOTE — Assessment & Plan Note (Signed)
Per the patient, he is unable to tolerate statins. He is on fenofibrate.

## 2010-12-21 NOTE — Assessment & Plan Note (Signed)
Mild-to-moderate aortic valve stenosis per the notes

## 2010-12-21 NOTE — Progress Notes (Signed)
Patient ID: Scott Norman, male    DOB: 05-30-1931, 77 y.o.   MRN: [id number]  HPI Comments: 77 yo with history of paroxysmal atrial fibrillation, CKD, and mild to moderate aortic stenosis, recent hospitalization for large cardioembolic stroke ([place] in 5/12 with a left MCA cardioembolic stroke),   treated with tPA, noted to be in atrial fibrillation  while in the hospital with residual profound expressive aphasia as a result of the stroke, now  on coumadin, Who presents with weight gain, worsening edema and abdominal swelling.  2 weeks ago, he had his blood work evaluated and creatinine was greater than 3. This is a significant change from May when creatinine was 1.56. He has had a weight gain from 215 pounds now to 236 today. Last month, his weight was 224. He does drink significant amount of fluid. He has been taking  Lasix Only the past few days with minimal urine output. He has more abdominal swelling and he reports it feels tight. No significant shortness of breath. He does have chronic lower extremity edema though it is worse now.   Patient walks with a cane.    No chest pain.   He denies lightheadedness or syncope.   ECG: NSR, RBBB, 1st degree AV block, rate of 57, PVCs     Outpatient Encounter Prescriptions as of 12/21/2010  Medication Sig Dispense Refill  . allopurinol (ZYLOPRIM) 300 MG tablet Take 300 mg by mouth daily.        . cloNIDine (CATAPRES) 0.1 MG tablet Take 1 tablet (0.1 mg total) by mouth at bedtime.  60 tablet  6  . Coenzyme Q10 200 MG TABS Take 1 tablet (200 mg total) by mouth daily.  30 tablet  0  . colchicine 0.6 MG tablet Take 0.6 mg by mouth daily.        . diclofenac sodium (VOLTAREN) 1 % GEL Apply 1 application topically 4 (four) times daily.        . fish oil-omega-3 fatty acids 1000 MG capsule Take 2 capsules (2 g total) by mouth daily.  30 capsule  0  . furosemide (LASIX) 80 MG tablet Take 1 tablet (80 mg total) by mouth 2 (two) times daily. As directed   60 tablet  6  . gemfibrozil (LOPID) 600 MG tablet Take 600 mg by mouth 2 (two) times daily before a meal.        . hydrALAZINE (APRESOLINE) 25 MG tablet Take 1 1/2 tablet (37.5 mg total) by mouth 3 (four) times daily.  120 tablet  6  . warfarin (COUMADIN) 5 MG tablet Take 5 mg by mouth daily. Adjust dose for goal INR 2.0-3.0       . glipiZIDE (GLUCOTROL) 10 MG 24 hr tablet Take 5 mg by mouth daily.        Amlodipine 10 mg 10 mg daily    . pravastatin (PRAVACHOL) 40 MG tablet Take 1 tablet (40 mg total) by mouth every evening.  30 tablet  6     Review of Systems  Constitutional: Positive for unexpected weight change.  HENT: Negative.   Eyes: Negative.   Respiratory: Negative.   Cardiovascular: Positive for leg swelling.  Gastrointestinal: Positive for abdominal distention.  Musculoskeletal: Negative.   Skin: Negative.   Neurological: Negative.   Hematological: Negative.   Psychiatric/Behavioral: Negative.   All other systems reviewed and are negative.    BP 162/70  Pulse 66  Ht 6' (1.829 m)  Wt 236 lb (107.049 kg)  BMI 32.01 kg/m2  Physical Exam  Nursing note and vitals reviewed. Constitutional: He is oriented to person, place, and time. He appears well-developed and well-nourished.  HENT:  Head: Normocephalic.  Nose: Nose normal.  Mouth/Throat: Oropharynx is clear and moist.  Eyes: Conjunctivae are normal. Pupils are equal, round, and reactive to light.  Neck: Normal range of motion. Neck supple. No JVD present.  Cardiovascular: Normal rate, regular rhythm, S1 normal, S2 normal, normal heart sounds and intact distal pulses.  Frequent extrasystoles are present. Exam reveals no gallop and no friction rub.   No murmur heard.      1+ pitting edema to below the knee b/l  Pulmonary/Chest: Effort normal and breath sounds normal. No respiratory distress. He has no wheezes. He has no rales. He exhibits no tenderness.  Abdominal: Bowel sounds are normal. He exhibits no distension.  There is no tenderness.       Abdomen feels firm to palpation, possibly consistent with edema.  Musculoskeletal: Normal range of motion. He exhibits edema. He exhibits no tenderness.  Lymphadenopathy:    He has no cervical adenopathy.  Neurological: He is alert and oriented to person, place, and time. Coordination normal.  Skin: Skin is warm and dry. No rash noted. No erythema.  Psychiatric: He has a normal mood and affect. His behavior is normal. Judgment and thought content normal.           Assessment and Plan

## 2010-12-21 NOTE — Assessment & Plan Note (Signed)
Blood pressure is very elevated recently per their numbers. This could be secondary to fluid retention. We will hold amlodipine secondary to swelling. Increase hydralazine to 100 mg q.i.d., increase clonidine to 0.2 mg b.i.d..

## 2010-12-21 NOTE — Assessment & Plan Note (Signed)
He is maintaining normal sinus rhythm on today's visit. Frequent PVCs.

## 2010-12-21 NOTE — Patient Instructions (Addendum)
Please increase the hydralazine to 100 mg Breakfast/Lunch/Dinner (also before bed for blood pressure greater than 140) Please increase the clonidine to 0.2 mg twice a day Please stop the amlodipine Please monitor your weight daily and blood pressure We are going to increase the lasix to 80 mg twice a day (8 am and 2 pm) (weight gain, leg swelling, belly swelling) Follow up in 7 to 10 days with labs We will help schedule an appt with the kidney doctor

## 2010-12-21 NOTE — Assessment & Plan Note (Signed)
We will check a basic metabolic panel today. It made a referral to Dr. Cherylann Ratel and Thedore Mins.

## 2010-12-22 ENCOUNTER — Telehealth: Payer: Self-pay

## 2010-12-22 ENCOUNTER — Other Ambulatory Visit: Payer: Medicare Other | Admitting: *Deleted

## 2010-12-22 ENCOUNTER — Telehealth: Payer: Self-pay | Admitting: Physician Assistant

## 2010-12-22 ENCOUNTER — Other Ambulatory Visit: Payer: Self-pay

## 2010-12-22 DIAGNOSIS — I350 Nonrheumatic aortic (valve) stenosis: Secondary | ICD-10-CM

## 2010-12-22 DIAGNOSIS — I4891 Unspecified atrial fibrillation: Secondary | ICD-10-CM

## 2010-12-22 DIAGNOSIS — R609 Edema, unspecified: Secondary | ICD-10-CM

## 2010-12-22 DIAGNOSIS — R0602 Shortness of breath: Secondary | ICD-10-CM

## 2010-12-22 LAB — BASIC METABOLIC PANEL
CO2: 18 mEq/L — ABNORMAL LOW (ref 19–32)
Chloride: 106 mEq/L (ref 96–112)
Glucose, Bld: 138 mg/dL — ABNORMAL HIGH (ref 70–99)
Potassium: 4.5 mEq/L (ref 3.5–5.3)
Sodium: 138 mEq/L (ref 135–145)

## 2010-12-22 NOTE — Telephone Encounter (Signed)
Notified patient's wife per Dr. Mariah Milling the patient needs to cut back the dose of lasix from 80 mg twice a day to lasix 20 mg twice a day with elevated legs & place an ace wrap up to the knee to help with the fluid circulation.  Also made sure the patient did stop the amlodipine.

## 2010-12-22 NOTE — Telephone Encounter (Signed)
Patient's daughter calling with patient c/o loss of appetite, feeling tired and is Banker.  Per Dr. Mariah Milling patient needs to stay on the lasix 80 mg twice a day with an Echo ASAP.

## 2010-12-22 NOTE — Telephone Encounter (Signed)
Pt dtr called because pt off Lasix for a week 2nd worsening RF and now on a much larger dose. Pt was on 20mg  x 2 qam. Now on 80mg  bid. Not that much UOP after am dose. No specific Sx but he feels bad. Seen by Dr Mariah Milling today. Advised her pt could be taken to ER but no other way to eval him tonight. She did not feel comfortable waiting till am and calling ofc, said she would try to get pt to ER tonight.

## 2010-12-23 ENCOUNTER — Other Ambulatory Visit: Payer: Medicare Other | Admitting: *Deleted

## 2010-12-23 ENCOUNTER — Ambulatory Visit (INDEPENDENT_AMBULATORY_CARE_PROVIDER_SITE_OTHER): Payer: Self-pay

## 2010-12-23 ENCOUNTER — Encounter: Payer: Medicare Other | Admitting: Emergency Medicine

## 2010-12-23 DIAGNOSIS — R0989 Other specified symptoms and signs involving the circulatory and respiratory systems: Secondary | ICD-10-CM

## 2010-12-23 NOTE — Telephone Encounter (Signed)
Would think a patient seen in the office that day would not need to come to the ER that night for a fluid issue. Was this patient admitted? Thanks [employer]

## 2010-12-28 ENCOUNTER — Ambulatory Visit (HOSPITAL_COMMUNITY): Payer: Medicare Other | Attending: Cardiology | Admitting: Radiology

## 2010-12-28 DIAGNOSIS — E785 Hyperlipidemia, unspecified: Secondary | ICD-10-CM | POA: Insufficient documentation

## 2010-12-28 DIAGNOSIS — I359 Nonrheumatic aortic valve disorder, unspecified: Secondary | ICD-10-CM

## 2010-12-28 DIAGNOSIS — I4891 Unspecified atrial fibrillation: Secondary | ICD-10-CM | POA: Insufficient documentation

## 2010-12-28 DIAGNOSIS — Z8673 Personal history of transient ischemic attack (TIA), and cerebral infarction without residual deficits: Secondary | ICD-10-CM | POA: Insufficient documentation

## 2010-12-28 DIAGNOSIS — R609 Edema, unspecified: Secondary | ICD-10-CM | POA: Insufficient documentation

## 2010-12-28 DIAGNOSIS — R0602 Shortness of breath: Secondary | ICD-10-CM

## 2010-12-28 DIAGNOSIS — I350 Nonrheumatic aortic (valve) stenosis: Secondary | ICD-10-CM

## 2010-12-28 DIAGNOSIS — I10 Essential (primary) hypertension: Secondary | ICD-10-CM | POA: Insufficient documentation

## 2010-12-30 ENCOUNTER — Encounter: Payer: Self-pay | Admitting: Cardiovascular Disease

## 2010-12-30 ENCOUNTER — Encounter: Payer: Self-pay | Admitting: Internal Medicine

## 2010-12-30 ENCOUNTER — Encounter: Payer: Self-pay | Admitting: Family Medicine

## 2010-12-30 ENCOUNTER — Ambulatory Visit (INDEPENDENT_AMBULATORY_CARE_PROVIDER_SITE_OTHER): Payer: Medicare Other | Admitting: Cardiovascular Disease

## 2010-12-30 ENCOUNTER — Ambulatory Visit (INDEPENDENT_AMBULATORY_CARE_PROVIDER_SITE_OTHER): Payer: Medicare Other | Admitting: Emergency Medicine

## 2010-12-30 DIAGNOSIS — N189 Chronic kidney disease, unspecified: Secondary | ICD-10-CM

## 2010-12-30 DIAGNOSIS — R609 Edema, unspecified: Secondary | ICD-10-CM

## 2010-12-30 DIAGNOSIS — I359 Nonrheumatic aortic valve disorder, unspecified: Secondary | ICD-10-CM

## 2010-12-30 DIAGNOSIS — Z7901 Long term (current) use of anticoagulants: Secondary | ICD-10-CM

## 2010-12-30 DIAGNOSIS — E785 Hyperlipidemia, unspecified: Secondary | ICD-10-CM

## 2010-12-30 DIAGNOSIS — I350 Nonrheumatic aortic (valve) stenosis: Secondary | ICD-10-CM

## 2010-12-30 DIAGNOSIS — I4891 Unspecified atrial fibrillation: Secondary | ICD-10-CM

## 2010-12-30 DIAGNOSIS — I10 Essential (primary) hypertension: Secondary | ICD-10-CM

## 2010-12-30 LAB — POCT INR: INR: 2.4

## 2010-12-30 LAB — BASIC METABOLIC PANEL
BUN: 67 mg/dL — ABNORMAL HIGH (ref 6–23)
CO2: 24 mEq/L (ref 19–32)
Chloride: 95 mEq/L — ABNORMAL LOW (ref 96–112)
Potassium: 4.2 mEq/L (ref 3.5–5.3)

## 2010-12-30 NOTE — Assessment & Plan Note (Signed)
Mild to moderate aortic valve stenosis on recent echocardiogram and normal LV systolic function.

## 2010-12-30 NOTE — Assessment & Plan Note (Signed)
He has followup in the next several weeks with the renal service.

## 2010-12-30 NOTE — Assessment & Plan Note (Signed)
Blood pressure continues to be elevated. We'll increase his hydralazine to 150 mg t.i.d. And keep his other medications at their current doses. Amlodipine was discontinued previously secondary to leg edema. If needed in the future, we could add amlodipine 5 mg and monitor for edema.

## 2010-12-30 NOTE — Progress Notes (Signed)
Patient ID: Scott Norman, male    DOB: [birth year], 77 y.o.   MRN: [id number]  HPI Comments: 77 yo with history of paroxysmal atrial fibrillation, CKD, and mild to moderate aortic stenosis, recent hospitalization for large cardioembolic stroke ([place] in 5/12 with a left MCA cardioembolic stroke),   treated with tPA, noted to be in atrial fibrillation  while in the hospital with residual profound expressive aphasia as a result of the stroke, now  on coumadin, Who presented with weight gain, worsening edema and abdominal swelling Last week to the clinic, now presented for routine followup.  His weight last week was 236 pounds. Today his weight is 224 pounds. He feels that his legs are significantly better with less swelling, less abdominal distention and fullness. His energy is better. Overall he feels significantly improved over the past week. His blood pressure continues to be elevated with systolic pressures in the 160-170 range.   Patient walks with a cane.    No chest pain.   He denies lightheadedness or syncope.   ECG: NSR, RBBB, 1st degree AV block, rate of 68, PVCs    Outpatient Encounter Prescriptions as of 12/30/2010  Medication Sig Dispense Refill  . allopurinol (ZYLOPRIM) 300 MG tablet Take 300 mg by mouth daily.        . cloNIDine (CATAPRES) 0.2 MG tablet Take 1 tablet (0.2 mg total) by mouth at bedtime.  60 tablet  6  . Coenzyme Q10 200 MG TABS Take 1 tablet (200 mg total) by mouth daily.  30 tablet  0  . colchicine 0.6 MG tablet Take 0.6 mg by mouth daily.        . diclofenac sodium (VOLTAREN) 1 % GEL Apply 1 application topically 4 (four) times daily.        . fish oil-omega-3 fatty acids 1000 MG capsule Take 2 capsules (2 g total) by mouth daily.  30 capsule  0  . furosemide (LASIX) 20 MG tablet Take 20 mg by mouth 2 (two) times daily.        Marland Kitchen gemfibrozil (LOPID) 600 MG tablet Take 600 mg by mouth 2 (two) times daily before a meal.        . glipiZIDE (GLUCOTROL) 10 MG 24 hr  tablet Take 5 mg by mouth daily.       . hydrALAZINE (APRESOLINE) 100 MG tablet Take 1 tablet (100 mg total) by mouth 4 (four) times daily.  120 tablet  6  . pravastatin (PRAVACHOL) 40 MG tablet Take 1 tablet (40 mg total) by mouth every evening.  30 tablet  6  . warfarin (COUMADIN) 5 MG tablet Take 5 mg by mouth daily. Adjust dose for goal INR 2.0-3.0       . furosemide (LASIX) 80 MG tablet Take 1 tablet (80 mg total) by mouth 2 (two) times daily. As directed  60 tablet  6      Review of Systems  Constitutional: Positive for unexpected weight change.  HENT: Negative.   Eyes: Negative.   Respiratory: Negative.   Musculoskeletal: Positive for gait problem.  Skin: Negative.   Neurological: Negative.   Hematological: Negative.   Psychiatric/Behavioral: Negative.   All other systems reviewed and are negative.    BP 160/60  Pulse 68  Ht 6' (1.829 m)  Wt 224 lb (101.606 kg)  BMI 30.38 kg/m2  Physical Exam  Nursing note and vitals reviewed. Constitutional: He is oriented to person, place, and time. He appears well-developed and well-nourished.  HENT:  Head: Normocephalic.  Nose: Nose normal.  Mouth/Throat: Oropharynx is clear and moist.  Eyes: Conjunctivae are normal. Pupils are equal, round, and reactive to light.  Neck: Normal range of motion. Neck supple. No JVD present.  Cardiovascular: Normal rate, regular rhythm, S1 normal, S2 normal, normal heart sounds and intact distal pulses.  Frequent extrasystoles are present. Exam reveals no gallop and no friction rub.   No murmur heard.      Minimal  Edema Around the ankles.  Pulmonary/Chest: Effort normal and breath sounds normal. No respiratory distress. He has no wheezes. He has no rales. He exhibits no tenderness.  Abdominal: Bowel sounds are normal. He exhibits no distension. There is no tenderness.  Musculoskeletal: Normal range of motion. He exhibits no tenderness.  Lymphadenopathy:    He has no cervical adenopathy.    Neurological: He is alert and oriented to person, place, and time. Coordination normal.  Skin: Skin is warm and dry. No rash noted. No erythema.  Psychiatric: He has a normal mood and affect. His behavior is normal. Judgment and thought content normal.           Assessment and Plan

## 2010-12-30 NOTE — Assessment & Plan Note (Signed)
Rhythm appears to be sinus with frequent PVCs. We will check his electrolytes.

## 2010-12-30 NOTE — Patient Instructions (Signed)
Hold fish oil Increase hydralazine to 1 1/2 tabs three times a day  When your weight is less than 218 , do not take any lasix For weight 218 to 224, take lasix  1/2 pill twice a day  (40 mg twice a day) For weight greater than 224, take lasix full pill twice a day (80 mg twice a day) Follow up in one month

## 2010-12-30 NOTE — Assessment & Plan Note (Addendum)
He has mild to moderate aortic valve stenosis, significant LVH, diastolic dysfunction.  This is likely the cause of his pulmonary hypertension and edema. Edema has resolved with significant diuresis and 12 pound weight loss. He feels much better. We will check a basic metabolic panel today as I'm concerned about hypokalemia and worsening renal dysfunction with diuresis.  We have given him an algorithm for his weight and diuretic regimen as detailed below. When your weight is less than 218 , do not take any lasix For weight 218 to 224, take lasix  1/2 pill twice a day  (40 mg twice a day) For weight greater than 224, take lasix full pill twice a day (80 mg twice a day)

## 2010-12-30 NOTE — Assessment & Plan Note (Signed)
History of hyperlipidemia with previous problems on statins. We will readdress his cholesterol management at his next visit once we have his blood pressure well controlled.

## 2011-01-06 ENCOUNTER — Encounter: Payer: Medicare Other | Admitting: Emergency Medicine

## 2011-01-07 ENCOUNTER — Telehealth: Payer: Self-pay

## 2011-01-07 NOTE — Telephone Encounter (Signed)
Spoke with Scott Norman regarding blood work from 12/30/10.  [name] per Dr. Mariah Milling Scott Norman is getting too dry.  Told her need to follow the following  orders from Dr. Mariah Milling.  Goal for weight is 224- 228.  1. Weight < 224- No diurectic. 2. Weight 224-228- Need to take lasix (1/2 pill) a day. 3. Weight > 228- lasix take the full pill. 4. Weight > 230- lasix take full pill in the am & pm..  The patient's daughter understands the above instructions.  The patient also will follow up with Dr. Mariah Milling on 01/20/2011 at 11:30.

## 2011-01-12 ENCOUNTER — Telehealth: Payer: Self-pay | Admitting: *Deleted

## 2011-01-12 ENCOUNTER — Encounter: Payer: Self-pay | Admitting: Cardiovascular Disease

## 2011-01-12 DIAGNOSIS — I10 Essential (primary) hypertension: Secondary | ICD-10-CM

## 2011-01-12 MED ORDER — ISOSORBIDE MONONITRATE ER 30 MG PO TB24: 30.0000 mg | ORAL_TABLET | Freq: Two times a day (BID) | ORAL | Status: DC

## 2011-01-12 MED ORDER — CLONIDINE HCL 0.3 MG PO TABS: 0.3000 mg | ORAL_TABLET | Freq: Every day | ORAL | Status: DC

## 2011-01-12 MED ORDER — HYDRALAZINE HCL 100 MG PO TABS: 150.0000 mg | ORAL_TABLET | Freq: Four times a day (QID) | ORAL | Status: DC

## 2011-01-12 NOTE — Telephone Encounter (Signed)
Pt's daughter called with pt's recent BP readings (sent fax). Pt recently discontinued amlodipine, and his SBP has been 180s-200. Spoke to Dr. Mariah Milling, and I have notified pt's daughter that we will incr hydralazine to an extra 1 1/2 tab qhs, incr clonidine to 0.3mg  daily, and will start Isosorbide mononitrate 30mg  bid (start once daily to begin with) for 2-3 days as long as BP tolerates, then immediately take BID. [name] ok with this and will call with any changes.

## 2011-01-13 ENCOUNTER — Encounter: Payer: Medicare Other | Admitting: Emergency Medicine

## 2011-01-18 ENCOUNTER — Ambulatory Visit (INDEPENDENT_AMBULATORY_CARE_PROVIDER_SITE_OTHER): Payer: Medicare Other | Admitting: *Deleted

## 2011-01-18 ENCOUNTER — Encounter: Payer: Self-pay | Admitting: *Deleted

## 2011-01-18 ENCOUNTER — Encounter: Payer: Self-pay | Admitting: Internal Medicine

## 2011-01-18 VITALS — BP 200/78 | HR 64 | Ht 72.0 in | Wt 228.0 lb

## 2011-01-18 DIAGNOSIS — I10 Essential (primary) hypertension: Secondary | ICD-10-CM

## 2011-01-18 MED ORDER — ISOSORBIDE MONONITRATE ER 30 MG PO TB24: ORAL_TABLET | ORAL | Status: DC

## 2011-01-18 NOTE — Patient Instructions (Signed)
Take Hydralazine 150 mg 4 times a day Breakfast/Lunch/dinner/Bedtime. The Isosorbide 30 mg take two tablets in the am & two tablets in the pm this will equal 60 mg twice a day.    If blood pressure is 200/80 take an emergency isosoribide 30 mg two tablets. This would be an addition to the above instructions only for emergency use.

## 2011-01-19 ENCOUNTER — Encounter: Payer: Self-pay | Admitting: *Deleted

## 2011-01-19 NOTE — Progress Notes (Signed)
Patient came in today for elevated blood pressure. We discussed blood pressure with Dr. Mariah Milling and medications were changed.  The patient left with no complaints.

## 2011-01-20 ENCOUNTER — Encounter: Payer: Self-pay | Admitting: Cardiovascular Disease

## 2011-01-20 ENCOUNTER — Encounter: Payer: Self-pay | Admitting: Emergency Medicine

## 2011-01-20 ENCOUNTER — Ambulatory Visit (INDEPENDENT_AMBULATORY_CARE_PROVIDER_SITE_OTHER): Payer: Medicare Other | Admitting: Emergency Medicine

## 2011-01-20 ENCOUNTER — Ambulatory Visit: Payer: Medicare Other | Admitting: Emergency Medicine

## 2011-01-20 DIAGNOSIS — Z7901 Long term (current) use of anticoagulants: Secondary | ICD-10-CM

## 2011-01-20 DIAGNOSIS — I4891 Unspecified atrial fibrillation: Secondary | ICD-10-CM

## 2011-01-20 NOTE — Patient Instructions (Signed)
Increase Clonidine 0.3mg  to twice daily and continue to monitor weight, Blood pressure, and heart rate and call Friday am with readings.

## 2011-01-22 ENCOUNTER — Telehealth: Payer: Self-pay

## 2011-01-22 MED ORDER — ISOSORBIDE MONONITRATE ER 30 MG PO TB24: 120.0000 mg | ORAL_TABLET | Freq: Two times a day (BID) | ORAL | Status: DC

## 2011-01-22 NOTE — Telephone Encounter (Signed)
Notified daughter per Dr. Mariah Milling have patient increase the Imdur to 120 mg bid with more blood pressure, heart rate & weight readings with a follow up on Tuesday with Dr. Elease Hashimoto.

## 2011-01-25 ENCOUNTER — Encounter: Payer: Self-pay | Admitting: Cardiovascular Disease

## 2011-01-26 ENCOUNTER — Ambulatory Visit (INDEPENDENT_AMBULATORY_CARE_PROVIDER_SITE_OTHER): Payer: Medicare Other | Admitting: Cardiovascular Disease

## 2011-01-26 ENCOUNTER — Encounter: Payer: Self-pay | Admitting: Cardiovascular Disease

## 2011-01-26 VITALS — BP 209/64 | HR 64 | Ht 72.0 in | Wt 228.0 lb

## 2011-01-26 DIAGNOSIS — I359 Nonrheumatic aortic valve disorder, unspecified: Secondary | ICD-10-CM

## 2011-01-26 DIAGNOSIS — I4891 Unspecified atrial fibrillation: Secondary | ICD-10-CM

## 2011-01-26 DIAGNOSIS — I10 Essential (primary) hypertension: Secondary | ICD-10-CM

## 2011-01-26 DIAGNOSIS — I350 Nonrheumatic aortic (valve) stenosis: Secondary | ICD-10-CM

## 2011-01-26 NOTE — Patient Instructions (Signed)
Your physician has recommended you make the following change in your medication: Re-start Amlodipine   Your physician recommends that you schedule a follow-up appointment in: 2 weeks Dr. Mariah Milling

## 2011-01-26 NOTE — Assessment & Plan Note (Signed)
Stable Continue coumadin 

## 2011-01-26 NOTE — Assessment & Plan Note (Signed)
Moderate, stable

## 2011-01-26 NOTE — Progress Notes (Signed)
Scott Norman Date of Birth  11-11-1930 [facility] / [facility] [address]     [address] [place], [place]  [address] [phone]  Fax  [phone]  History of Present Illness:  Scott Norman is a 77 year old gentleman with a history of stroke, hypertension, chronic kidney disease, and atrial fibrillation. He presents today for a work in visit for further advice regarding his blood pressure.  He's been seeing Dr. Mariah Milling for management of his atrial fibrillation and hypertension.  He has moderate chronic kidney disease which complicates this issue. He has also been seen by the nephrologist.  Several visits ago Dr. Mariah Milling stopped his amlodipine because of  profound leg edema.  The patient was seen by his nephrologist yesterday he wanted to restart amlodipine because of his markedly elevated blood pressure. It was the nephrologist opinion that the leg edema was actually neuro do to the patient's nephrotic syndrome and chronic kidney disease rather than amlodipine. In fact Dr. Mariah Milling also noted that we could start the amlodipine back if the blood pressure remained poorly controlled.  The patient presented today with his daughter. His wife joined the office visit several minutes late. Also included in the office visit was his son, Thayer Ohm, who joined him on the office visit via speakerphone.   The patient has had a stroke and it is sometimes difficult for him to discuss all the questions. We discussed the fact that he may be a little bit of extra salt and he agreed that that was probably the case. His wife adamantly denied the fact that he was getting any exercise.  Later in the exam after the wife left, the daughter told me that the wife regularly brings Scott Norman country ham biscuits, biscuits and gravy, and other very high salt foods.  In addition Scott Norman does not get any regular exercise. He's had a stroke and is also some orthopedic problems that limits his ability to exercise  regularly.  Current Outpatient Prescriptions on File Prior to Visit  Medication Sig Dispense Refill  . allopurinol (ZYLOPRIM) 300 MG tablet Take 300 mg by mouth daily.        . cloNIDine (CATAPRES) 0.3 MG tablet Take 0.3 mg by mouth 2 (two) times daily.        . Coenzyme Q10 200 MG TABS Take 1 tablet (200 mg total) by mouth daily.  30 tablet  0  . colchicine 0.6 MG tablet Take 0.6 mg by mouth daily.        . diclofenac sodium (VOLTAREN) 1 % GEL Apply 1 application topically 4 (four) times daily.        . furosemide (LASIX) 20 MG tablet Take 20 mg by mouth 2 (two) times daily.        . furosemide (LASIX) 80 MG tablet Take 1 tablet (80 mg total) by mouth 2 (two) times daily. As directed  60 tablet  6  . gemfibrozil (LOPID) 600 MG tablet Take 600 mg by mouth 2 (two) times daily before a meal.        . glipiZIDE (GLUCOTROL) 10 MG 24 hr tablet Take 5 mg by mouth daily.       . hydrALAZINE (APRESOLINE) 100 MG tablet Take 1.5 tablets (150 mg total) by mouth 4 (four) times daily.  135 tablet  6  . isosorbide mononitrate (IMDUR) 30 MG 24 hr tablet Take 4 tablets (120 mg total) by mouth 2 (two) times daily.  30 tablet  11  . warfarin (COUMADIN)  5 MG tablet Take 5 mg by mouth daily. Adjust dose for goal INR 2.0-3.0       . fish oil-omega-3 fatty acids 1000 MG capsule Take 2 capsules (2 g total) by mouth daily.  30 capsule  0    Allergies  Allergen Reactions  . Ace Inhibitors     Renal insufficiency  . Beta Adrenergic Blockers     bradycardia  . Statins     Past Medical History  Diagnosis Date  . Diabetes mellitus   . Hypertension   . Stroke   . Degenerative disc disease, lumbar   . Gout   . Atrial fibrillation   . PVD (peripheral vascular disease)   . Aortic stenosis   . Hyperlipidemia   . Chronic kidney disease (CKD), stage III (moderate)     Past Surgical History  Procedure Date  . Shoulder surgery     History  Smoking status  . Never Smoker   Smokeless tobacco  . Never Used     History  Alcohol Use  . Yes    rarely    Family History  Problem Relation Age of Onset  . Hypertension Other   . Diabetes Other     Reviw of Systems:  Reviewed in the HPI.  All other systems are negative.  Physical Exam: BP 209/64  Pulse 64  Ht 6' (1.829 m)  Wt 228 lb (103.42 kg)  BMI 30.92 kg/m2 The patient is alert and oriented x 3.  The mood and affect are normal.   Skin: warm and dry.  Color is normal.    HEENT:   the sclera are nonicteric.  The mucous membranes are moist.  The carotids are 2+ without bruits.  There is no thyromegaly.  There is no JVD.    Lungs: clear.  The chest wall is non tender.    Heart: Irregular rate with a normal S1 and S2.  There is a 2-3/6 systolic murmur that radiates up to the upper right sternal border. There is also a soft systolic murmur radiates out to the axilla. The PMI is not displaced.     Abdomen: good bowel sounds.  There is no guarding or rebound.  There is no hepatosplenomegaly or tenderness.  There are no masses.   Extremities:  Trace bilateral edema.  The legs are without rashes.  The distal pulses are intact.   Neuro:  Cranial nerves II - XII are intact.  Motor and sensory functions are intact.    The gait is normal.   Assessment / Plan:

## 2011-01-26 NOTE — Assessment & Plan Note (Addendum)
I had a very long discussion with later he and his daughter [name].  I think it 1 significant problem is that when his wife continues to feed him very salty foods. We reviewed the salt content of common foods and hopefully she would feed him a lower salt diet.  At this point his blood pressure is very high and I think that we had no choice but to retry the amlodipine. If he has some mild edema and will need to consider leg elevation, compression hose, or other measures. He's on multiple medications and we're limited somewhat by his chronic kidney disease.  Restart Amlodipine 5 mg a day.  Watch for edema. Needs to exercise. Continue other medications. He still eats lots of salt - needs to avoid salty foods.  He will return to see Dr.Gollan in a month or so.

## 2011-01-27 ENCOUNTER — Telehealth: Payer: Self-pay | Admitting: Cardiovascular Disease

## 2011-01-27 MED ORDER — ISOSORBIDE MONONITRATE ER 30 MG PO TB24: 120.0000 mg | ORAL_TABLET | Freq: Two times a day (BID) | ORAL | Status: DC

## 2011-01-27 MED ORDER — CLONIDINE HCL 0.3 MG PO TABS: 0.3000 mg | ORAL_TABLET | Freq: Two times a day (BID) | ORAL | Status: DC

## 2011-01-27 NOTE — Telephone Encounter (Signed)
Pt daughter called and needs a new script for Clonidine and isosorbide called into [facility]. Pt meds were increased and now he is running out sooner.

## 2011-01-27 NOTE — Telephone Encounter (Signed)
Called [name], pt's daughter, lm notifying I have sent in refill for Rx's below.

## 2011-01-28 ENCOUNTER — Telehealth: Payer: Self-pay | Admitting: Cardiovascular Disease

## 2011-01-28 ENCOUNTER — Ambulatory Visit: Payer: Medicare Other | Admitting: Cardiology

## 2011-01-28 NOTE — Telephone Encounter (Signed)
Pt daughter called and wants to know if Dr Mariah Milling would recommend some physical therapy for the pt. For around 30 mins a day. Pt PCP told them that his cardiologist would need to be the one to recommend the PT.

## 2011-01-29 NOTE — Telephone Encounter (Signed)
Please advise 

## 2011-01-30 ENCOUNTER — Encounter: Payer: Self-pay | Admitting: Family Medicine

## 2011-02-01 NOTE — Telephone Encounter (Signed)
Ok to start PT either hospital, [facility], or home PT

## 2011-02-02 NOTE — Telephone Encounter (Signed)
Attempted to contact pt's wife, LMOM TCB.

## 2011-02-03 ENCOUNTER — Ambulatory Visit (INDEPENDENT_AMBULATORY_CARE_PROVIDER_SITE_OTHER): Payer: Medicare Other | Admitting: Emergency Medicine

## 2011-02-03 DIAGNOSIS — I4891 Unspecified atrial fibrillation: Secondary | ICD-10-CM

## 2011-02-03 DIAGNOSIS — Z7901 Long term (current) use of anticoagulants: Secondary | ICD-10-CM

## 2011-02-03 NOTE — Telephone Encounter (Signed)
Pt has coumadin appt today, will discuss w/ pt and spouse at that time.

## 2011-02-06 ENCOUNTER — Emergency Department: Payer: Self-pay | Admitting: Unknown Physician Specialty

## 2011-02-09 ENCOUNTER — Ambulatory Visit: Payer: Medicare Other | Admitting: Cardiovascular Disease

## 2011-02-10 ENCOUNTER — Ambulatory Visit (INDEPENDENT_AMBULATORY_CARE_PROVIDER_SITE_OTHER): Payer: Medicare Other | Admitting: Emergency Medicine

## 2011-02-10 ENCOUNTER — Ambulatory Visit (INDEPENDENT_AMBULATORY_CARE_PROVIDER_SITE_OTHER): Payer: Medicare Other | Admitting: Cardiovascular Disease

## 2011-02-10 ENCOUNTER — Encounter: Payer: Self-pay | Admitting: Cardiovascular Disease

## 2011-02-10 DIAGNOSIS — I350 Nonrheumatic aortic (valve) stenosis: Secondary | ICD-10-CM

## 2011-02-10 DIAGNOSIS — R609 Edema, unspecified: Secondary | ICD-10-CM

## 2011-02-10 DIAGNOSIS — I4891 Unspecified atrial fibrillation: Secondary | ICD-10-CM

## 2011-02-10 DIAGNOSIS — Z7901 Long term (current) use of anticoagulants: Secondary | ICD-10-CM

## 2011-02-10 DIAGNOSIS — E785 Hyperlipidemia, unspecified: Secondary | ICD-10-CM

## 2011-02-10 DIAGNOSIS — I359 Nonrheumatic aortic valve disorder, unspecified: Secondary | ICD-10-CM

## 2011-02-10 DIAGNOSIS — I10 Essential (primary) hypertension: Secondary | ICD-10-CM

## 2011-02-10 NOTE — Progress Notes (Signed)
Patient ID: Scott Norman, male    DOB: [birth year], 77 y.o.   MRN: [id number]  HPI Comments: 77 yo with history of paroxysmal atrial fibrillation, CKD, and mild to moderate aortic stenosis, recent hospitalization for large cardioembolic stroke ([place] in 5/12 with a left MCA cardioembolic stroke),   treated with tPA, noted to be in atrial fibrillation  while in the hospital with residual profound expressive aphasia as a result of the stroke, now  on coumadin, Who presented with weight gain, worsening edema and abdominal swelling several months ago (weight was 236lbs) , now presenting for routine followup.  Today his weight is 225 pounds. He was restarted on amlodipine by Dr. Cherylann Ratel for hypertension. Since that time, he reports having worsening edema though his weight has stayed the same. In fact his weight at home is 218 pounds and he is not taking diuretic currently. His energy is better. He is still participating with speech therapy   His blood pressure continues to be elevated In the morning with occasional systolic pressures in the 190-200 range. During the daytime, the blood pressure decreases to 120-160 systolic.   Patient walks with a cane.    No chest pain.   He denies lightheadedness or syncope.   Old ECG: NSR, RBBB, 1st degree AV block, rate of 68, PVCs    Outpatient Encounter Prescriptions as of 02/10/2011  Medication Sig Dispense Refill  . allopurinol (ZYLOPRIM) 300 MG tablet Take 300 mg by mouth daily.        Marland Kitchen amLODipine (NORVASC) 5 MG tablet Take 5 mg by mouth daily.        . ciprofloxacin (CIPRO) 250 MG tablet Take 250 mg by mouth 2 (two) times daily. Take for 10 days.  Started on 02/08/2011.       . cloNIDine (CATAPRES) 0.3 MG tablet Take 1 tablet (0.3 mg total) by mouth 2 (two) times daily.  60 tablet  6  . Coenzyme Q10 200 MG TABS Take 1 tablet (200 mg total) by mouth daily.  30 tablet  0  . colchicine 0.6 MG tablet Take 0.6 mg by mouth daily.        . diclofenac sodium  (VOLTAREN) 1 % GEL Apply 1 application topically 4 (four) times daily.        . fish oil-omega-3 fatty acids 1000 MG capsule Take 2 capsules (2 g total) by mouth daily.  30 capsule  0  . furosemide (LASIX) 20 MG tablet Take 20 mg by mouth 2 (two) times daily.        . furosemide (LASIX) 80 MG tablet Take 1 tablet (80 mg total) by mouth 2 (two) times daily. As directed  60 tablet  6  . gemfibrozil (LOPID) 600 MG tablet Take 600 mg by mouth 2 (two) times daily before a meal.        . glipiZIDE (GLUCOTROL) 10 MG 24 hr tablet Take 5 mg by mouth daily.       . hydrALAZINE (APRESOLINE) 100 MG tablet Take 1.5 tablets (150 mg total) by mouth 4 (four) times daily.  135 tablet  6  . isosorbide mononitrate (IMDUR) 30 MG 24 hr tablet Take 4 tablets (120 mg total) by mouth 2 (two) times daily.  30 tablet  6  . olmesartan (BENICAR) 20 MG tablet Take 20 mg by mouth daily.        Marland Kitchen warfarin (COUMADIN) 5 MG tablet Take 5 mg by mouth daily. Adjust dose for goal INR 2.0-3.0  Review of Systems  HENT: Negative.   Eyes: Negative.   Respiratory: Negative.   Musculoskeletal: Positive for gait problem.  Skin: Negative.   Neurological: Negative.        Speech difficulty  Hematological: Negative.   Psychiatric/Behavioral: Negative.   All other systems reviewed and are negative.    BP 176/67  Pulse 62  Ht 6' (1.829 m)  Wt 225 lb (102.059 kg)  BMI 30.52 kg/m2  Physical Exam  Nursing note and vitals reviewed. Constitutional: He is oriented to person, place, and time. He appears well-developed and well-nourished.  HENT:  Head: Normocephalic.  Nose: Nose normal.  Mouth/Throat: Oropharynx is clear and moist.  Eyes: Conjunctivae are normal. Pupils are equal, round, and reactive to light.  Neck: Normal range of motion. Neck supple. No JVD present. Carotid bruit is present.  Cardiovascular: Normal rate, regular rhythm, S1 normal, S2 normal and intact distal pulses.  Frequent extrasystoles are  present. Exam reveals no gallop and no friction rub.   Murmur heard.  Crescendo systolic murmur is present with a grade of 2/6       Trace to 1+ pitting edema above the sock line below the knees bilaterally, trace edema around the ankles. TED Hose in place   Pulmonary/Chest: Effort normal and breath sounds normal. No respiratory distress. He has no wheezes. He has no rales. He exhibits no tenderness.  Abdominal: Soft. Bowel sounds are normal. He exhibits no distension. There is no tenderness.  Musculoskeletal: Normal range of motion. He exhibits no edema and no tenderness.  Lymphadenopathy:    He has no cervical adenopathy.  Neurological: He is alert and oriented to person, place, and time. Coordination normal.  Skin: Skin is warm and dry. No rash noted. No erythema.  Psychiatric: He has a normal mood and affect. His behavior is normal. Judgment and thought content normal.           Assessment and Plan

## 2011-02-10 NOTE — Assessment & Plan Note (Addendum)
In an effort to smooth out his blood pressure, we have suggested he take isosorbide a.m. Dosing at 5 or 6 in the morning when he wakes up, also with his hydralazine. We have suggested he take clonidine at breakfast, taking his evening medicines as late as possible.  If he continues to have labile pressures, we could change the clonidine to a patch. I suspect the amlodipine, which was restarted, is causing worsening lower extremity edema from venous insufficiency. We have not stopped the amlodipine at this time as his blood pressure continues to be elevated. I hope would be to hold this medication in the future once his blood pressure is better controlled

## 2011-02-10 NOTE — Assessment & Plan Note (Signed)
His warfarin was adjusted today she is on ciprofloxacin. INR is 2.9. He is on warfarin given his history of atrial fibrillation and stroke.

## 2011-02-10 NOTE — Patient Instructions (Signed)
You are doing well. Please take hydralazine and isosorbide/imdur early in the AM Please take clonidine and your other normal medications.  Please call us if you have new issues that need to be addressed before your next appt.  We will call you for a follow up Appt. In 3 months

## 2011-02-10 NOTE — Assessment & Plan Note (Signed)
Mild to moderate aortic valve disease. Murmur auscultated on clinical exam.

## 2011-02-10 NOTE — Assessment & Plan Note (Signed)
He is currently on Lopid. We will discuss with him at his next visit whether he can tolerate a statin.

## 2011-02-10 NOTE — Assessment & Plan Note (Signed)
I suspect his edema is likely secondary to venous insufficiency, possibly exacerbated by amlodipine. His edema did significantly improve down to trace edema around the ankles off the amlodipine and with diuresis.

## 2011-02-17 ENCOUNTER — Ambulatory Visit (INDEPENDENT_AMBULATORY_CARE_PROVIDER_SITE_OTHER): Payer: Medicare Other | Admitting: Emergency Medicine

## 2011-02-17 ENCOUNTER — Telehealth: Payer: Self-pay

## 2011-02-17 DIAGNOSIS — I4891 Unspecified atrial fibrillation: Secondary | ICD-10-CM

## 2011-02-17 DIAGNOSIS — Z7901 Long term (current) use of anticoagulants: Secondary | ICD-10-CM

## 2011-02-17 LAB — POCT INR: INR: 1.5

## 2011-02-17 MED ORDER — CLONIDINE HCL 0.3 MG/24HR TD PTWK: 2.0000 | MEDICATED_PATCH | TRANSDERMAL | Status: DC

## 2011-02-17 NOTE — Telephone Encounter (Signed)
Catapres patch 0.3 mg per Dr. Mariah Milling.

## 2011-02-18 ENCOUNTER — Telehealth: Payer: Self-pay | Admitting: *Deleted

## 2011-02-18 ENCOUNTER — Encounter: Payer: Self-pay | Admitting: Internal Medicine

## 2011-02-18 NOTE — Telephone Encounter (Signed)
Spoke with pt's daughter regarding medication change per Dr. Mariah Milling. Pt will start Catapres 0.3 patch x 2 patches and will wear for 7 days at a time. Instructed after 2 days of wearing to stop taking clonidine tablet. [name] verbalized understanding.   Dr. Terance Hart changed pt's Benicar to Losartan 100mg , pt's daughter [name] wanted to make sure Dr. Mariah Milling was ok with this change prior to him starting. Please advise.

## 2011-02-22 ENCOUNTER — Telehealth: Payer: Self-pay | Admitting: *Deleted

## 2011-02-22 MED ORDER — CLONIDINE HCL 0.3 MG PO TABS: 0.1500 mg | ORAL_TABLET | Freq: Every day | ORAL | Status: DC | PRN

## 2011-02-22 NOTE — Telephone Encounter (Signed)
Pt's daughter [name], called this AM stating since d/c PO Clonidine 2 days after wearing Clonidine patch, pt's BP has been elevated. Saturday 206/77 and 227/67. Sun 3pm 177/59 7pm 180/64. Today SBP still 180. Per Dr. Mariah Milling, spoke to Thayer Ohm (son) [phone], gave orders to take Clonidine 0.3mg  1/2 tablet PRN for SBP >170. Son verbalized understanding, and will call with any other BP issues with time of day.

## 2011-03-01 ENCOUNTER — Encounter: Payer: Self-pay | Admitting: Family Medicine

## 2011-03-03 ENCOUNTER — Telehealth: Payer: Self-pay | Admitting: *Deleted

## 2011-03-03 ENCOUNTER — Ambulatory Visit (INDEPENDENT_AMBULATORY_CARE_PROVIDER_SITE_OTHER): Payer: Medicare Other | Admitting: Emergency Medicine

## 2011-03-03 DIAGNOSIS — I4891 Unspecified atrial fibrillation: Secondary | ICD-10-CM

## 2011-03-03 DIAGNOSIS — Z7901 Long term (current) use of anticoagulants: Secondary | ICD-10-CM

## 2011-03-03 NOTE — Telephone Encounter (Signed)
Spoke with [name] last week, she sent pt's BP numbers SBP 180-210/60s-70s. Pt is taking multiple BP meds at max doses, we have added more recently. Daughter afraid that pt may not be receiving meds as prescribed. Along with other medical reasons, we will send order for home health to help manage polypharmacy per Dr. Mariah Milling.

## 2011-03-05 ENCOUNTER — Telehealth: Payer: Self-pay | Admitting: *Deleted

## 2011-03-05 NOTE — Telephone Encounter (Signed)
Pt's daughter sent updated BP recordings. SBP still runs high at150s-200, however, some days are better now with few readings of 130s. Home health has been established and they should be out to home soon for initial assessment; this will be for medication mgt for polypharmacy and VS monitoring. Medlist is updated, pt is taking clonidine 0.3 PO 1/2 tab PRN for SBP >170. Daughter asking if he takes 1/2 tab in AM and in evening BP still high after normal meds, can he take another PO clonidine 1/2 tab? (He is wearing clonidine patch now). Please advise.

## 2011-03-07 NOTE — Telephone Encounter (Signed)
Ok to take extra clonidine prn

## 2011-03-08 NOTE — Telephone Encounter (Signed)
Lisa notified.

## 2011-03-17 ENCOUNTER — Ambulatory Visit (INDEPENDENT_AMBULATORY_CARE_PROVIDER_SITE_OTHER): Payer: Medicare Other | Admitting: Emergency Medicine

## 2011-03-17 DIAGNOSIS — Z7901 Long term (current) use of anticoagulants: Secondary | ICD-10-CM

## 2011-03-17 DIAGNOSIS — I639 Cerebral infarction, unspecified: Secondary | ICD-10-CM

## 2011-03-17 DIAGNOSIS — I6350 Cerebral infarction due to unspecified occlusion or stenosis of unspecified cerebral artery: Secondary | ICD-10-CM

## 2011-03-17 DIAGNOSIS — I4891 Unspecified atrial fibrillation: Secondary | ICD-10-CM

## 2011-03-17 LAB — POCT INR: INR: 1.9

## 2011-03-31 ENCOUNTER — Ambulatory Visit (INDEPENDENT_AMBULATORY_CARE_PROVIDER_SITE_OTHER): Payer: Medicare Other | Admitting: Emergency Medicine

## 2011-03-31 DIAGNOSIS — I4891 Unspecified atrial fibrillation: Secondary | ICD-10-CM

## 2011-03-31 DIAGNOSIS — Z7901 Long term (current) use of anticoagulants: Secondary | ICD-10-CM

## 2011-03-31 DIAGNOSIS — I6350 Cerebral infarction due to unspecified occlusion or stenosis of unspecified cerebral artery: Secondary | ICD-10-CM

## 2011-03-31 DIAGNOSIS — I639 Cerebral infarction, unspecified: Secondary | ICD-10-CM

## 2011-03-31 LAB — POCT INR: INR: 2.6

## 2011-04-01 ENCOUNTER — Encounter: Payer: Self-pay | Admitting: Family Medicine

## 2011-04-01 HISTORY — PX: RENAL ARTERY STENT: SHX2321

## 2011-04-06 ENCOUNTER — Ambulatory Visit: Payer: Self-pay | Admitting: Vascular Surgery

## 2011-04-06 DIAGNOSIS — I10 Essential (primary) hypertension: Secondary | ICD-10-CM

## 2011-04-08 ENCOUNTER — Telehealth: Payer: Self-pay | Admitting: *Deleted

## 2011-04-08 NOTE — Telephone Encounter (Signed)
Pt's daughter called to let us know he had his renal stent with Dr. Reino Kent, I will call to get report, she said artery was 80% blocked. He was started on Plavix 75mg , just wanted to make sure ok along with his coumadin.

## 2011-04-08 NOTE — Telephone Encounter (Signed)
Would confirm that he is not taking aspirin We need to try to keep INR in the low 2 range, stay on plavix (probably will need a few months after renal stent

## 2011-04-09 NOTE — Telephone Encounter (Signed)
Called spoke with pt's daughter advised green tea does contain vit K, per pt's daughter pt has been drinking x 1 month +, advised ok to drink key is to keep consistant in the diet.  Also advised pt's daughter ok to keep scheduled f/u appt on 04/21/11 for f/u will adjust dosage to maintain pt's INR 2.0-2.5 per Dr Windell Hummingbird request.

## 2011-04-09 NOTE — Telephone Encounter (Signed)
Scott Norman, daughter notified of below, pt does not take ASA. He has coumadin check on 11/21, Scott Norman are ok with keeping this appt or does he need sooner? Also, daughter asking if green tea can affect coumadin?

## 2011-04-21 ENCOUNTER — Ambulatory Visit (INDEPENDENT_AMBULATORY_CARE_PROVIDER_SITE_OTHER): Payer: Medicare Other | Admitting: Emergency Medicine

## 2011-04-21 DIAGNOSIS — Z7901 Long term (current) use of anticoagulants: Secondary | ICD-10-CM

## 2011-04-21 DIAGNOSIS — I4891 Unspecified atrial fibrillation: Secondary | ICD-10-CM

## 2011-04-21 DIAGNOSIS — I6350 Cerebral infarction due to unspecified occlusion or stenosis of unspecified cerebral artery: Secondary | ICD-10-CM

## 2011-04-21 DIAGNOSIS — I639 Cerebral infarction, unspecified: Secondary | ICD-10-CM

## 2011-04-21 LAB — POCT INR: INR: 1.7

## 2011-04-26 ENCOUNTER — Telehealth: Payer: Self-pay | Admitting: *Deleted

## 2011-04-26 NOTE — Telephone Encounter (Signed)
The clonidine patch cost him over $300.00 and can't afford it. The medication will need a prior authorization unless you would like to suggest something different. Please advise what to do?

## 2011-04-26 NOTE — Telephone Encounter (Signed)
Received pt's BP recordings (after renal stent placed early November) starting 11/12: 187/62 67, 169/58 60, 162/62 69, 174/59 56, 180/64 60, 167/66 74, 169/58 60, 169/58 60, 197/78 68, 187/67 71, 182/63 62, 173/57 63

## 2011-05-01 ENCOUNTER — Encounter: Payer: Self-pay | Admitting: Family Medicine

## 2011-05-02 NOTE — Telephone Encounter (Signed)
Was the clonidine patch $300 because it is the end of the year? Would go back on clonidine 0.3 mg po TID

## 2011-05-03 ENCOUNTER — Telehealth: Payer: Self-pay

## 2011-05-03 MED ORDER — CLONIDINE HCL 0.3 MG PO TABS: 0.3000 mg | ORAL_TABLET | Freq: Three times a day (TID) | ORAL | Status: DC

## 2011-05-03 NOTE — Telephone Encounter (Signed)
New Rx sent for clonidine 0.3 mg tid to [facility] pharmacy.

## 2011-05-03 NOTE — Telephone Encounter (Signed)
Spoke to [place], notified of below. She is not sure why med was $300, she is going to ask pharmacy to send prior auth form, will notify [name]. FYI pt did just have renal stent. In the meantime, pt will start Clonidine 0.3mg  TID after takes off last patch at end of week. Rx sent in. Pt will monitor BP after this change and will send 1-2 weeks of results. Alfredo Batty asking if there is any way they may credit $300, I was not sure and wanted to just ask you. Otherwise, she knows to discuss with pharmacy.

## 2011-05-05 ENCOUNTER — Encounter: Payer: Self-pay | Admitting: *Deleted

## 2011-05-05 ENCOUNTER — Ambulatory Visit (INDEPENDENT_AMBULATORY_CARE_PROVIDER_SITE_OTHER): Payer: Medicare Other | Admitting: Emergency Medicine

## 2011-05-05 DIAGNOSIS — I6350 Cerebral infarction due to unspecified occlusion or stenosis of unspecified cerebral artery: Secondary | ICD-10-CM

## 2011-05-05 DIAGNOSIS — Z7901 Long term (current) use of anticoagulants: Secondary | ICD-10-CM

## 2011-05-05 DIAGNOSIS — I639 Cerebral infarction, unspecified: Secondary | ICD-10-CM

## 2011-05-05 DIAGNOSIS — I4891 Unspecified atrial fibrillation: Secondary | ICD-10-CM

## 2011-05-06 ENCOUNTER — Telehealth: Payer: Self-pay | Admitting: *Deleted

## 2011-05-06 NOTE — Telephone Encounter (Signed)
Pt brought BP numbers by office. He was going to stop clonidine patch due to cost. When patch is completed on Monday, he will take off and start Clonidine 0.3mg  TID. Pt saw Dr. Cherylann Ratel recently and he started him on Doxazosin 2mg  daily on 05/04/11. Pt's BP results that were given do not reflect the start of either of these new meds. I recc he record another 2 weeks and bring numbers to his f/u appt on 05/12/11. Pt's daughter notified.   11/21 173/57 63; 11/23 158/59 65; 11/24 175/64 65; 11/25 193/73 70; 11/26 204/69 70; 11/28 182/64 80; 11/30 177/64 68; 12/1 164/66 63; 12/2 157/59 63; 12/5 152/49 62

## 2011-05-10 ENCOUNTER — Telehealth: Payer: Self-pay

## 2011-05-10 NOTE — Telephone Encounter (Signed)
The patient was taking the clonidine patch and now taking the clonidine 0.3 mg tid. Scott Norman started on the clonidine 0.3 mg tablet on last Thursday, Dec. 6, 2012.  On Friday he was not feeling very good, his heart rate was running low around 45 blood pressure 153/? Feet and ankles very swollen through the weekend with feeling weak. Dr. Darrick Huntsman told Scott Norman (daughter) to cut the clonidine in half tid through the weekend to see if he felt any better. Also would like to know if when he comes for office visit on Wed if can get an order for compression stockings to have faxed to [facility]

## 2011-05-12 ENCOUNTER — Ambulatory Visit (INDEPENDENT_AMBULATORY_CARE_PROVIDER_SITE_OTHER): Payer: Medicare Other | Admitting: Cardiovascular Disease

## 2011-05-12 ENCOUNTER — Encounter: Payer: Self-pay | Admitting: Cardiovascular Disease

## 2011-05-12 VITALS — BP 134/52 | HR 60 | Ht 72.0 in | Wt 221.5 lb

## 2011-05-12 DIAGNOSIS — I350 Nonrheumatic aortic (valve) stenosis: Secondary | ICD-10-CM

## 2011-05-12 DIAGNOSIS — I4892 Unspecified atrial flutter: Secondary | ICD-10-CM

## 2011-05-12 DIAGNOSIS — I10 Essential (primary) hypertension: Secondary | ICD-10-CM

## 2011-05-12 DIAGNOSIS — I359 Nonrheumatic aortic valve disorder, unspecified: Secondary | ICD-10-CM

## 2011-05-12 DIAGNOSIS — R609 Edema, unspecified: Secondary | ICD-10-CM

## 2011-05-12 DIAGNOSIS — N189 Chronic kidney disease, unspecified: Secondary | ICD-10-CM

## 2011-05-12 DIAGNOSIS — I4891 Unspecified atrial fibrillation: Secondary | ICD-10-CM

## 2011-05-12 MED ORDER — OLMESARTAN MEDOXOMIL 40 MG PO TABS: 40.0000 mg | ORAL_TABLET | Freq: Every day | ORAL | Status: DC

## 2011-05-12 NOTE — Assessment & Plan Note (Signed)
He is currently in atrial flutter today which is new for him.  This may explain the low heart rate as sometimes the monitors do not read an effort heart rate while in atrial flutter. Ventricular rate today is 60 beats per minute. He is not interested in cardioversion at this time. We have suggested we evaluate his rhythm in several weeks' time to see if he has converted on his own. I suspect he will need a cardioversion.

## 2011-05-12 NOTE — Assessment & Plan Note (Signed)
I suggested he use Lasix only for weight gain, worsening edema. Currently he is using Lasix sparingly. He denies any significant shortness of breath.

## 2011-05-12 NOTE — Assessment & Plan Note (Signed)
Edema I suspect is secondary to venous insufficiency. Symptoms are worse on the left. This is the leg that had previous trauma/fracture. 1+ edema of the right ankle

## 2011-05-12 NOTE — Assessment & Plan Note (Signed)
Mild-to-moderate aortic valve stenosis. This is likely contributing to arrhythmia and fluid retention.

## 2011-05-12 NOTE — Assessment & Plan Note (Signed)
He continues to have edema which I suspect could be secondary to the calcium channel blocker. This was previously held with improvement of his edema. Now that his blood pressure is improving, we will decrease the amlodipine to 5 mg daily, changed to clonidine to 0.3 mg b.i.d. For simplicity, and increase the Benicar to 40 mg daily. Blood pressure continues to be elevated over the past week or so though is trending down slowly.

## 2011-05-12 NOTE — Telephone Encounter (Signed)
Daughter,Lisa, calls today regarding pt appt. She would like Dr. Mariah Milling to know they decreased the Clonidine 0.3mg  to 1/2 tablet tid. Pt hr was in the 40's. Systolic 150/ They will bring bp log today. Pt feet and ankles are "still very much swollen" He needs a new prescription for compression hose. Please give this his son today at ov.  Also, clonidine patch that was previously prescribed cost the pt $348. Of the $400. Cost. Is there any way they could recoup some of that? Suggested having pharmacy refile prescription with [employer]. Am not sure if this need pre auth before having been fille. Daughter, [name], would like a call back this afternoon if any medication changes are made. She fills pt pill boxes. Mylo Red RN

## 2011-05-12 NOTE — Patient Instructions (Addendum)
You are doing well. Please decrease the amlodipine to 5 mg daily (cut the 10 mg in 1/2) to minimize swelling Change the clonidine to one in the Am and PM (0.3 mg) Increase the benicar to 40 mg daily  Monitor your blood pressure and weight Only take lasix for worsening edema or weight gain  We will check the rhythm again after the new year to determine if your need a cardioversion for atrial flutter  Please call us if you have new issues that need to be addressed before your next appt.  The office will contact you for a follow up Appt. In 1 months

## 2011-05-12 NOTE — Progress Notes (Signed)
Patient ID: Scott Norman, male    DOB: 05/13/1931, 77 y.o.   MRN: [id number]  HPI Comments: 77 yo with history of paroxysmal atrial fibrillation, CKD, and mild to moderate aortic stenosis, recent hospitalization for large cardioembolic stroke ([place] in 5/12 with a left MCA cardioembolic stroke),   treated with tPA, noted to be in atrial fibrillation  while in the hospital with residual profound expressive aphasia as a result of the stroke, now  on coumadin, presenting with weight gain, worsening edema and abdominal swelling several months ago , Severe hypertension, found to have renal artery stenosis, now status post stent placement. His blood pressure has slowly been trending downward.  He reports having malaise and anorexia. His weight has been decreasing and he reports a weight of 215 pounds at home. He continues to have lower extremity edema despite decreased fluid intake. He attributes some of his anorexia to the medications. Blood pressure continues to show systolic pressures in the 160 range, better than previous blood pressures in the 200 range. He is no longer on his diabetes medication.  He has noticed increased malaise over the past several days with bradycardia. His clonidine was cut in half and he currently takes 0.15 mg t.i.d.  EKG today shows atrial flutter with rate of 60 beats per minute    Outpatient Encounter Prescriptions as of 05/12/2011  Medication Sig Dispense Refill  . allopurinol (ZYLOPRIM) 300 MG tablet Take 300 mg by mouth daily.        . clopidogrel (PLAVIX) 75 MG tablet Take 75 mg by mouth daily.        . Coenzyme Q10 200 MG TABS Take 1 tablet (200 mg total) by mouth daily.  30 tablet  0  . colchicine 0.6 MG tablet Take 0.6 mg by mouth daily.        . diclofenac sodium (VOLTAREN) 1 % GEL Apply 1 application topically 4 (four) times daily.        Marland Kitchen doxazosin (CARDURA) 2 MG tablet Take 2 mg by mouth at bedtime.        . fish oil-omega-3 fatty acids 1000 MG  capsule Take 2 capsules (2 g total) by mouth daily.  30 capsule  0  . furosemide (LASIX) 20 MG tablet Take 20 mg by mouth 2 (two) times daily.        . furosemide (LASIX) 80 MG tablet Take 1 tablet (80 mg total) by mouth 2 (two) times daily. As directed  60 tablet  6  . gemfibrozil (LOPID) 600 MG tablet Take 600 mg by mouth 2 (two) times daily before a meal.        . hydrALAZINE (APRESOLINE) 100 MG tablet Take 1.5 tablets (150 mg total) by mouth 4 (four) times daily.  135 tablet  6  . isosorbide mononitrate (IMDUR) 30 MG 24 hr tablet Take 4 tablets (120 mg total) by mouth 2 (two) times daily.  30 tablet  6  . warfarin (COUMADIN) 5 MG tablet Take 5 mg by mouth daily. Adjust dose for goal INR 2.0-3.0       . amLODipine (NORVASC) 10 MG tablet Take 10 mg by mouth daily.        .  cloNIDine (CATAPRES) 0.3 MG tablet Take 1/2 tablet three times a day.       .  olmesartan (BENICAR) 20 MG tablet Take 20 mg by mouth daily.        Marland Kitchen glipiZIDE (GLUCOTROL) 10 MG 24 hr tablet Take  5 mg by mouth daily. NOT TAKING         Review of Systems  HENT: Negative.   Eyes: Negative.   Cardiovascular: Positive for leg swelling.  Musculoskeletal: Positive for gait problem.  Skin: Negative.   Neurological: Negative.        Speech difficulty  Hematological: Negative.   Psychiatric/Behavioral: Negative.   All other systems reviewed and are negative.    BP 134/52  Pulse 60  Ht 6' (1.829 m)  Wt 221 lb 8 oz (100.472 kg)  BMI 30.04 kg/m2  Physical Exam  Nursing note and vitals reviewed. Constitutional: He is oriented to person, place, and time. He appears well-developed and well-nourished.  HENT:  Head: Normocephalic.  Nose: Nose normal.  Mouth/Throat: Oropharynx is clear and moist.  Eyes: Conjunctivae are normal. Pupils are equal, round, and reactive to light.  Neck: Normal range of motion. Neck supple. No JVD present. Carotid bruit is present.  Cardiovascular: Normal rate, S1 normal, S2 normal and intact  distal pulses.  An irregularly irregular rhythm present.  Occasional extrasystoles are present. Exam reveals no gallop and no friction rub.   Murmur heard.  Crescendo systolic murmur is present with a grade of 2/6       Trace to 1+ pitting edema above the sock line below the knees bilaterally, trace edema around the ankles. Left greater than right  Pulmonary/Chest: Effort normal and breath sounds normal. No respiratory distress. He has no wheezes. He has no rales. He exhibits no tenderness.  Abdominal: Soft. Bowel sounds are normal. He exhibits no distension. There is no tenderness.  Musculoskeletal: Normal range of motion. He exhibits no edema and no tenderness.  Lymphadenopathy:    He has no cervical adenopathy.  Neurological: He is alert and oriented to person, place, and time. Coordination normal.  Skin: Skin is warm and dry. No rash noted. No erythema.  Psychiatric: He has a normal mood and affect. His behavior is normal. Judgment and thought content normal.           Assessment and Plan

## 2011-05-13 ENCOUNTER — Telehealth: Payer: Self-pay | Admitting: *Deleted

## 2011-05-13 MED ORDER — LOSARTAN POTASSIUM 100 MG PO TABS: 100.0000 mg | ORAL_TABLET | Freq: Every day | ORAL | Status: DC

## 2011-05-13 NOTE — Telephone Encounter (Signed)
Daughter called stating pt's insurance does not cover Benicar. This was incr to 40mg  yesterday in office, she was asking if he could take Losartan again as he did in the past? Please advise.

## 2011-05-13 NOTE — Telephone Encounter (Signed)
Losartan 100 mg po daily would be ok #30, refill 6

## 2011-05-13 NOTE — Telephone Encounter (Signed)
[name] notified, Rx sent in.

## 2011-05-14 ENCOUNTER — Telehealth: Payer: Self-pay | Admitting: *Deleted

## 2011-05-14 NOTE — Telephone Encounter (Signed)
Pt saw AV VS today for renal arterial stent f/u with no problems. BP numbers dropped off, daughter concerned with low diastolic recently in 40s. Pt has been more fatigued lately per [name]; per TG told her that we are trying to lower SBP, so naturally this is going to push down diastolic, not a concern unless pt c/o dizziness or other related symptoms. Asked [name] to call office if fatigue worsens or any changes, otherwise continue all meds.   Starting 05/05/11: 152/49 62, 152/76, 151/52, 146/50 59, 164/52 59, 158/55 52, 156/49 58, 167/56 65, 144/48 59, 156/40.

## 2011-05-18 ENCOUNTER — Telehealth: Payer: Self-pay | Admitting: *Deleted

## 2011-05-18 NOTE — Telephone Encounter (Signed)
Discussed with Dr. Mariah Milling, and notified [name] to continue to have pt hold for now (1-2 days) and if sx better, will send in Diovan 180mg . If not will need to determine cause of sx but may restart the losartan. [name] ok with this and will call back no later than Thurs this week.

## 2011-05-18 NOTE — Telephone Encounter (Signed)
Since starting Losartan on Thursday last week, pt has c/o nausea and dizziness. BP today 172/60, HR unknown at this time. Pt was on this med in past ordered by Dr. Terance Hart, and daughter cannot remember why it was stopped, "pt has been on and off so many meds." I told to have pt hold losartan today, if symptoms improve will see what med Dr. Mariah Milling wants him to take. OV 12/12 his amlodipine decr to 5mg  daily (to decr swelling), clonidine changed to 0.3 BID, Benicar was incr to 40-- insurance did not cover, this is why we changed benicar to losartan. Please advise regarding BP med/side effects.

## 2011-05-19 ENCOUNTER — Ambulatory Visit (INDEPENDENT_AMBULATORY_CARE_PROVIDER_SITE_OTHER): Payer: Medicare Other | Admitting: Emergency Medicine

## 2011-05-19 DIAGNOSIS — I6350 Cerebral infarction due to unspecified occlusion or stenosis of unspecified cerebral artery: Secondary | ICD-10-CM

## 2011-05-19 DIAGNOSIS — I4891 Unspecified atrial fibrillation: Secondary | ICD-10-CM

## 2011-05-19 DIAGNOSIS — I639 Cerebral infarction, unspecified: Secondary | ICD-10-CM

## 2011-05-19 DIAGNOSIS — Z7901 Long term (current) use of anticoagulants: Secondary | ICD-10-CM

## 2011-06-01 ENCOUNTER — Encounter: Payer: Self-pay | Admitting: Family Medicine

## 2011-06-02 ENCOUNTER — Encounter: Payer: Medicare Other | Admitting: Emergency Medicine

## 2011-06-02 ENCOUNTER — Ambulatory Visit (INDEPENDENT_AMBULATORY_CARE_PROVIDER_SITE_OTHER): Payer: Medicare Other | Admitting: Emergency Medicine

## 2011-06-02 ENCOUNTER — Ambulatory Visit: Payer: Medicare Other | Admitting: Cardiovascular Disease

## 2011-06-02 DIAGNOSIS — I6350 Cerebral infarction due to unspecified occlusion or stenosis of unspecified cerebral artery: Secondary | ICD-10-CM

## 2011-06-02 DIAGNOSIS — I4891 Unspecified atrial fibrillation: Secondary | ICD-10-CM

## 2011-06-02 DIAGNOSIS — Z7901 Long term (current) use of anticoagulants: Secondary | ICD-10-CM

## 2011-06-02 DIAGNOSIS — I639 Cerebral infarction, unspecified: Secondary | ICD-10-CM

## 2011-06-02 LAB — POCT INR: INR: 1.8

## 2011-06-16 ENCOUNTER — Encounter: Payer: Self-pay | Admitting: Cardiovascular Disease

## 2011-06-16 ENCOUNTER — Ambulatory Visit (INDEPENDENT_AMBULATORY_CARE_PROVIDER_SITE_OTHER): Payer: Medicare Other | Admitting: Emergency Medicine

## 2011-06-16 ENCOUNTER — Ambulatory Visit (INDEPENDENT_AMBULATORY_CARE_PROVIDER_SITE_OTHER): Payer: Medicare Other | Admitting: Cardiovascular Disease

## 2011-06-16 VITALS — BP 185/63 | HR 53 | Ht 75.0 in | Wt 224.5 lb

## 2011-06-16 DIAGNOSIS — I4892 Unspecified atrial flutter: Secondary | ICD-10-CM

## 2011-06-16 DIAGNOSIS — I4891 Unspecified atrial fibrillation: Secondary | ICD-10-CM

## 2011-06-16 DIAGNOSIS — E785 Hyperlipidemia, unspecified: Secondary | ICD-10-CM

## 2011-06-16 DIAGNOSIS — I639 Cerebral infarction, unspecified: Secondary | ICD-10-CM

## 2011-06-16 DIAGNOSIS — I350 Nonrheumatic aortic (valve) stenosis: Secondary | ICD-10-CM

## 2011-06-16 DIAGNOSIS — I10 Essential (primary) hypertension: Secondary | ICD-10-CM

## 2011-06-16 DIAGNOSIS — Z7901 Long term (current) use of anticoagulants: Secondary | ICD-10-CM

## 2011-06-16 DIAGNOSIS — I359 Nonrheumatic aortic valve disorder, unspecified: Secondary | ICD-10-CM

## 2011-06-16 NOTE — Assessment & Plan Note (Addendum)
We have suggested he try red yeast rice, and continue on his fibrate. He does not want a statin.

## 2011-06-16 NOTE — Assessment & Plan Note (Signed)
Appears to be in sinus rhythm today,  Converting from atrial flutter on his last visit. He is on warfarin.

## 2011-06-16 NOTE — Progress Notes (Signed)
Patient ID: Scott Norman, male    DOB: [birth year], 78 y.o.   MRN: [id number]  HPI Comments: 79 yo with history of paroxysmal atrial fibrillation, CKD, and mild to moderate aortic stenosis, recent hospitalization for large cardioembolic stroke ([place] in 5/12 with a left MCA cardioembolic stroke),   treated with tPA, noted to be in atrial fibrillation  while in the hospital with residual profound expressive aphasia as a result of the stroke, now  on coumadin, presenting with weight gain, worsening edema and abdominal swelling several months ago , Severe hypertension, found to have renal artery stenosis, now status post stent placement. His blood pressure has slowly been trending downward.  Overall He reports that he has been feeling well. Mild edema of the left lower extremity, no significant edema on the right. He denies any shortness of breath chest pain. Blood pressure at home has been typically in the low 140 range. He is somewhat weak and not doing any significant exercise. He uses Lasix rarely.  We discussed his cholesterol with him and he does not want to be on a statin  EKG today shows sinus bradycardia with rate of 52 bpm, 1 st degree AV block, RBBB    Outpatient Encounter Prescriptions as of 06/16/2011  Medication Sig Dispense Refill  . allopurinol (ZYLOPRIM) 300 MG tablet Take 300 mg by mouth daily.        Marland Kitchen amLODipine (NORVASC) 10 MG tablet Take 0.5 tablets (5 mg total) by mouth daily.      . cloNIDine (CATAPRES) 0.3 MG tablet Take 0.3 mg by mouth 2 (two) times daily.       . clopidogrel (PLAVIX) 75 MG tablet Take 75 mg by mouth daily.        . Coenzyme Q10 200 MG TABS Take 1 tablet (200 mg total) by mouth daily.  30 tablet  0  . colchicine 0.6 MG tablet Take 0.6 mg by mouth daily.        . diclofenac sodium (VOLTAREN) 1 % GEL Apply 1 application topically 4 (four) times daily.        Marland Kitchen doxazosin (CARDURA) 2 MG tablet Take 2 mg by mouth at bedtime.        . fish oil-omega-3  fatty acids 1000 MG capsule Take 2 capsules (2 g total) by mouth daily.  30 capsule  0  . furosemide (LASIX) 80 MG tablet Take 80 mg by mouth 2 (two) times daily as needed. As directed      . gemfibrozil (LOPID) 600 MG tablet Take 600 mg by mouth 2 (two) times daily before a meal.        . glipiZIDE (GLUCOTROL) 10 MG 24 hr tablet Take 5 mg by mouth daily.       . hydrALAZINE (APRESOLINE) 100 MG tablet Take 1.5 tablets (150 mg total) by mouth 4 (four) times daily.  135 tablet  6  . isosorbide mononitrate (IMDUR) 30 MG 24 hr tablet Take 4 tablets (120 mg total) by mouth 2 (two) times daily.  30 tablet  6  . losartan (COZAAR) 100 MG tablet Take 1 tablet (100 mg total) by mouth daily.  30 tablet  6  . warfarin (COUMADIN) 5 MG tablet Take 5 mg by mouth daily. Adjust dose for goal INR 2.0-3.0          Review of Systems  Constitutional: Negative.   HENT: Negative.   Eyes: Negative.   Respiratory: Negative.   Cardiovascular: Positive for leg swelling.  Gastrointestinal: Negative.   Musculoskeletal: Positive for gait problem.  Skin: Negative.   Neurological: Negative.        Speech difficulty  Hematological: Negative.   Psychiatric/Behavioral: Negative.   All other systems reviewed and are negative.   BP 185/63  Pulse 53  Ht 6\' 3"  (1.905 m)  Wt 224 lb 8 oz (101.833 kg)  BMI 28.06 kg/m2  Physical Exam  Nursing note and vitals reviewed. Constitutional: He is oriented to person, place, and time. He appears well-developed and well-nourished.  HENT:  Head: Normocephalic.  Nose: Nose normal.  Mouth/Throat: Oropharynx is clear and moist.  Eyes: Conjunctivae are normal. Pupils are equal, round, and reactive to light.  Neck: Normal range of motion. Neck supple. No JVD present. Carotid bruit is present.  Cardiovascular: Normal rate, S1 normal, S2 normal and intact distal pulses.  An irregularly irregular rhythm present.  Occasional extrasystoles are present. Exam reveals no gallop and no  friction rub.   Murmur heard.  Crescendo systolic murmur is present with a grade of 2/6       Trace to 1+ pitting edema above the sock line below the knees bilaterally, trace edema around the ankles. Left greater than right  Pulmonary/Chest: Effort normal and breath sounds normal. No respiratory distress. He has no wheezes. He has no rales. He exhibits no tenderness.  Abdominal: Soft. Bowel sounds are normal. He exhibits no distension. There is no tenderness.  Musculoskeletal: Normal range of motion. He exhibits no edema and no tenderness.  Lymphadenopathy:    He has no cervical adenopathy.  Neurological: He is alert and oriented to person, place, and time. Coordination normal.  Skin: Skin is warm and dry. No rash noted. No erythema.  Psychiatric: He has a normal mood and affect. His behavior is normal. Judgment and thought content normal.           Assessment and Plan

## 2011-06-16 NOTE — Assessment & Plan Note (Signed)
We have asked him to closely monitor his blood pressure at home. He could take extra clonidine one half dose as needed for hypertension

## 2011-06-16 NOTE — Patient Instructions (Addendum)
You are doing well. No medication changes were made.  Please call us if you have new issues that need to be addressed before your next appt.  Your physician wants you to follow-up in: 6 months.  You will receive a reminder letter in the mail two months in advance. If you don't receive a letter, please call our office to schedule the follow-up appointment.  Try RED YEAST RICE for cholesterol.  Are you are plavix?  Please call the office. We will take off the list if you are not on this.

## 2011-06-16 NOTE — Assessment & Plan Note (Signed)
Mild to moderate 

## 2011-06-30 ENCOUNTER — Ambulatory Visit (INDEPENDENT_AMBULATORY_CARE_PROVIDER_SITE_OTHER): Payer: Medicare Other | Admitting: Emergency Medicine

## 2011-06-30 ENCOUNTER — Telehealth: Payer: Self-pay | Admitting: *Deleted

## 2011-06-30 DIAGNOSIS — I4891 Unspecified atrial fibrillation: Secondary | ICD-10-CM

## 2011-06-30 DIAGNOSIS — I639 Cerebral infarction, unspecified: Secondary | ICD-10-CM

## 2011-06-30 DIAGNOSIS — I6350 Cerebral infarction due to unspecified occlusion or stenosis of unspecified cerebral artery: Secondary | ICD-10-CM

## 2011-06-30 DIAGNOSIS — Z7901 Long term (current) use of anticoagulants: Secondary | ICD-10-CM

## 2011-06-30 NOTE — Telephone Encounter (Signed)
Pt will have knee replacement scheduled 07/19/11 with Dr. Thurston Hole, he will need clearance for surgery and to hold coumadin. I have tried to contact Jewel Baize, RN and LMOM TCB and asked her to refax his clearance request, I only found this out from pt's daughter.

## 2011-07-01 ENCOUNTER — Other Ambulatory Visit: Payer: Self-pay | Admitting: Cardiovascular Disease

## 2011-07-01 ENCOUNTER — Encounter: Payer: Self-pay | Admitting: *Deleted

## 2011-07-01 NOTE — Telephone Encounter (Signed)
Per Dr. Mariah Milling, ok for pt to take 0.3 early AM and late PM and only take 1/2 tablet of 0.3mg  at noon and early afternoon. Lisa notified.

## 2011-07-01 NOTE — Telephone Encounter (Signed)
Acceptable risk for surgery. Would do lovenox bridge at renal dosing which is 100 mg SQ once a day Stop warfarin 5 days before surgery Start lovenox three days before surgery No lovenox morning of surgery Start lovenox back after surgery, day after procedure (needs TED hose) 5 days after surgery, restart warfarin day after surgery  Would also start clonidine 0.2 mg at noon for high BP measurements  Take clonidine 0.3 in early AM and in late PM, 0.2 mg in early afternoon

## 2011-07-01 NOTE — Telephone Encounter (Signed)
Attempted to call [name], LMOM TCB. Dr. Garnett Farm office will fax pt's BMET, done 06/29/11, creat was 1.95- reason for decr dose. Will notify daughter of BP med changes when she calls back. Will forward note to [facility] for Lovenox instructions. Clearance letter faxed to [facility] today.

## 2011-07-02 ENCOUNTER — Encounter: Payer: Self-pay | Admitting: Family Medicine

## 2011-07-07 ENCOUNTER — Encounter (HOSPITAL_COMMUNITY): Payer: Self-pay | Admitting: Pharmacy Technician

## 2011-07-14 ENCOUNTER — Inpatient Hospital Stay (HOSPITAL_COMMUNITY): Admission: RE | Admit: 2011-07-14 | Discharge: 2011-07-14 | Payer: Medicare Other | Source: Ambulatory Visit

## 2011-07-14 ENCOUNTER — Ambulatory Visit (INDEPENDENT_AMBULATORY_CARE_PROVIDER_SITE_OTHER): Payer: Medicare Other | Admitting: Emergency Medicine

## 2011-07-14 DIAGNOSIS — I4891 Unspecified atrial fibrillation: Secondary | ICD-10-CM

## 2011-07-14 DIAGNOSIS — Z7901 Long term (current) use of anticoagulants: Secondary | ICD-10-CM

## 2011-07-14 DIAGNOSIS — I639 Cerebral infarction, unspecified: Secondary | ICD-10-CM

## 2011-07-14 DIAGNOSIS — I6350 Cerebral infarction due to unspecified occlusion or stenosis of unspecified cerebral artery: Secondary | ICD-10-CM

## 2011-07-14 LAB — POCT INR: INR: 1.7

## 2011-07-14 NOTE — Pre-Procedure Instructions (Signed)
20 Scott Norman  07/14/2011   Your procedure is scheduled on:  2.18.13  Report to [facility] at 825 AM.  Call this number if you have problems the morning of surgery: [phone]   Remember:   Do not eat food:After Midnight.  May have clear liquids: up to 4 Hours before arrival.  Clear liquids include soda, tea, black coffee, apple or grape juice, broth.  Take these medicines the morning of surgery with A SIP OF WATER: imdue, allopurinol, amlodipine,clonidine, colchicine,cardura,hydralazine STOP coumadin, plavix,red yeast rice NO glipizide day of surgery   Do not wear jewelry, make-up or nail polish.  Do not wear lotions, powders, or perfumes. You may wear deodorant.  Do not shave 48 hours prior to surgery.  Do not bring valuables to the hospital.  Contacts, dentures or bridgework may not be worn into surgery.  Leave suitcase in the car. After surgery it may be brought to your room.  For patients admitted to the hospital, checkout time is 11:00 AM the day of discharge.   Patients discharged the day of surgery will not be allowed to drive home.  Name and phone number of your driver:   Special Instructions: Incentive Spirometry - Practice and bring it with you on the day of surgery. and CHG Shower Use Special Wash: 1/2 bottle night before surgery and 1/2 bottle morning of surgery.   Please read over the following fact sheets that you were given: Pain Booklet, Coughing and Deep Breathing, Blood Transfusion Information, Total Joint Packet, MRSA Information and Surgical Site Infection Prevention

## 2011-07-14 NOTE — Telephone Encounter (Signed)
07/14/11 Start holding Coumadin today, No Coumadin. 07/15/11 No Coumadin, No Lovenox. 07/16/11 Lovenox 100mg  injection once daily in am. 07/17/11 Lovenox 100mg  injection once daily in am. 07/18/11 Lovenox 100mg  injection once daily in am. 07/19/11 No Lovenox day of procedure. 07/20/11 Resume Lovenox 100mg  injection once daily in am if OK with surgeon and continue once daily until INR >2.0.  Resume Coumadin 10mg  x 2 days then resume previous dosage 7.5mg  daily except 5mg  on Sundays and Thursdays once OK'd by surgeon to resume.  Pt seen in [facility]madin Clinic today, surgery has been postponed until pt seeing Hematologist for decr Hgb.  Did not bridge pt at tody's OV as anticipated.

## 2011-07-15 ENCOUNTER — Telehealth: Payer: Self-pay | Admitting: Internal Medicine

## 2011-07-15 NOTE — Telephone Encounter (Signed)
[phone] Made new pt appointment 09/07/11 but daughter [name] layne wanted to know if you could see him sooner Pt procrite 9.1 the surgeon at [facility] wants him to get this up before he can have knee replacement  Had stoke  In may 2012 affect speech   His primary care is dr Ronnald Collum at [facility] clinic they are not happy there

## 2011-07-15 NOTE — Telephone Encounter (Signed)
You can take 2 followups that are availabe and put him in sooner.  Please block my schedule on the first Tuesday of every month after 11:15 (last mornign appt should be 11:15) for the Board of Directors Meeting every month

## 2011-07-16 ENCOUNTER — Telehealth: Payer: Self-pay | Admitting: Cardiovascular Disease

## 2011-07-16 NOTE — Telephone Encounter (Signed)
Appointment 2/26 daughter aware of appointment

## 2011-07-16 NOTE — Telephone Encounter (Signed)
Patient's daughter called to let you know that he started on Levaquin antibiotic yesterday.  She said [name] is being referred to Urologist for urinary problems and that is why the antibiotic was started.  [name] said if you have any questions or need to change his coumadin due to this, please call her at [phone]

## 2011-07-16 NOTE — Telephone Encounter (Signed)
Called spoke with pt's daughter [name] advised Levaquin can increase Coumadin level. Pt started on Levaquin yesterday 07/15/11.  Made appt to check Coumadin on 07/21/11.

## 2011-07-19 ENCOUNTER — Ambulatory Visit (HOSPITAL_COMMUNITY): Admission: RE | Admit: 2011-07-19 | Payer: Medicare Other | Source: Ambulatory Visit | Admitting: Orthopedic Surgery

## 2011-07-19 ENCOUNTER — Encounter (HOSPITAL_COMMUNITY): Admission: RE | Payer: Self-pay | Source: Ambulatory Visit

## 2011-07-19 SURGERY — ARTHROPLASTY, KNEE, TOTAL
Anesthesia: General | Laterality: Right

## 2011-07-21 ENCOUNTER — Ambulatory Visit (INDEPENDENT_AMBULATORY_CARE_PROVIDER_SITE_OTHER): Payer: Medicare Other | Admitting: *Deleted

## 2011-07-21 DIAGNOSIS — I639 Cerebral infarction, unspecified: Secondary | ICD-10-CM

## 2011-07-21 DIAGNOSIS — I4891 Unspecified atrial fibrillation: Secondary | ICD-10-CM

## 2011-07-21 DIAGNOSIS — Z7901 Long term (current) use of anticoagulants: Secondary | ICD-10-CM

## 2011-07-21 DIAGNOSIS — I6350 Cerebral infarction due to unspecified occlusion or stenosis of unspecified cerebral artery: Secondary | ICD-10-CM

## 2011-07-21 LAB — POCT INR: INR: 2.4

## 2011-07-23 ENCOUNTER — Ambulatory Visit: Payer: Self-pay | Admitting: Oncology

## 2011-07-23 LAB — LACTATE DEHYDROGENASE: LDH: 162 U/L (ref 87–241)

## 2011-07-23 LAB — CBC CANCER CENTER
Basophil #: 0 x10 3/mm (ref 0.0–0.1)
Eosinophil #: 0.1 x10 3/mm (ref 0.0–0.7)
Eosinophil %: 1.3 %
HCT: 29.4 % — ABNORMAL LOW (ref 40.0–52.0)
HGB: 9.7 g/dL — ABNORMAL LOW (ref 13.0–18.0)
Lymphocyte #: 1.4 x10 3/mm (ref 1.0–3.6)
Lymphocyte %: 19.3 %
MCH: 30.5 pg (ref 26.0–34.0)
MCHC: 32.9 g/dL (ref 32.0–36.0)
MCV: 93 fL (ref 80–100)
Monocyte %: 7.2 %
Neutrophil #: 5.3 x10 3/mm (ref 1.4–6.5)
Neutrophil %: 71.8 %
RBC: 3.17 10*6/uL — ABNORMAL LOW (ref 4.40–5.90)
WBC: 7.3 x10 3/mm (ref 3.8–10.6)

## 2011-07-23 LAB — RETICULOCYTES
Absolute Retic Count: 0.026 10*6/uL (ref 0.024–0.084)
Reticulocyte: 0.8 % (ref 0.5–1.5)

## 2011-07-23 LAB — IRON AND TIBC
Iron Saturation: 19 %
Iron: 61 ug/dL — ABNORMAL LOW (ref 65–175)

## 2011-07-27 ENCOUNTER — Ambulatory Visit (INDEPENDENT_AMBULATORY_CARE_PROVIDER_SITE_OTHER): Payer: Medicare Other | Admitting: Internal Medicine

## 2011-07-27 ENCOUNTER — Encounter: Payer: Self-pay | Admitting: Internal Medicine

## 2011-07-27 VITALS — BP 128/56 | HR 68 | Temp 98.5°F | Resp 16 | Ht 68.5 in | Wt 223.0 lb

## 2011-07-27 DIAGNOSIS — N390 Urinary tract infection, site not specified: Secondary | ICD-10-CM

## 2011-07-27 DIAGNOSIS — E785 Hyperlipidemia, unspecified: Secondary | ICD-10-CM

## 2011-07-27 DIAGNOSIS — N189 Chronic kidney disease, unspecified: Secondary | ICD-10-CM

## 2011-07-27 DIAGNOSIS — N058 Unspecified nephritic syndrome with other morphologic changes: Secondary | ICD-10-CM

## 2011-07-27 DIAGNOSIS — E119 Type 2 diabetes mellitus without complications: Secondary | ICD-10-CM

## 2011-07-27 DIAGNOSIS — D649 Anemia, unspecified: Secondary | ICD-10-CM

## 2011-07-27 DIAGNOSIS — K5909 Other constipation: Secondary | ICD-10-CM

## 2011-07-27 DIAGNOSIS — E1121 Type 2 diabetes mellitus with diabetic nephropathy: Secondary | ICD-10-CM

## 2011-07-27 DIAGNOSIS — G2581 Restless legs syndrome: Secondary | ICD-10-CM

## 2011-07-27 DIAGNOSIS — I10 Essential (primary) hypertension: Secondary | ICD-10-CM

## 2011-07-27 DIAGNOSIS — E1129 Type 2 diabetes mellitus with other diabetic kidney complication: Secondary | ICD-10-CM

## 2011-07-27 DIAGNOSIS — K5900 Constipation, unspecified: Secondary | ICD-10-CM

## 2011-07-27 DIAGNOSIS — Z1211 Encounter for screening for malignant neoplasm of colon: Secondary | ICD-10-CM

## 2011-07-27 DIAGNOSIS — I350 Nonrheumatic aortic (valve) stenosis: Secondary | ICD-10-CM

## 2011-07-27 DIAGNOSIS — I359 Nonrheumatic aortic valve disorder, unspecified: Secondary | ICD-10-CM

## 2011-07-27 DIAGNOSIS — B952 Enterococcus as the cause of diseases classified elsewhere: Secondary | ICD-10-CM

## 2011-07-27 LAB — POCT URINALYSIS DIPSTICK
Blood, UA: NEGATIVE
Ketones, UA: NEGATIVE
Protein, UA: 100
Spec Grav, UA: 1.025
pH, UA: 5.5

## 2011-07-27 NOTE — Assessment & Plan Note (Signed)
Currently managed with red yeast rice and gemfibrozil. He will return for fasting lipids with goal LDL of 70-100 given his concurrent diabetes

## 2011-07-27 NOTE — Progress Notes (Signed)
Subjective:    Patient ID: Scott Norman, male    DOB: 07-27-1930, 78 y.o.   MRN: [id number]  HPI Scott Norman is a 78 year old white male who is here to transfer primary care from Dr. Terance Hart.  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 Gwen Pounds. He is now on Coumadin and followed by [facility] cardiology. He also has a profound expressive aphasia as a result of his stroke.Marland Kitchen Past medical history also includes diabetes mellitus type 2 with chronic renal insufficiency and anemia of chronic disease.  Has had chronic knee pain secondary to DJD and was set to undergo knee replacement by Dr. Wyline Mood but this has been postponed due to his anemia and multiple comorbid conditions. He has recently been reevaluated by cardiology and has been cleared for surgery. He is accompanied today by his wife and his daughter the son who has brought records from the [facility] including recent labs done by Dr. Juel Burrow for boggy. For these labs his diabetes is well controlled on diet alone.  he was recently evaluated by nephrology for his anemia and Was given a short of Procrit . HIS chief complaint is constant r knee pain,  10/10  in severity. However he has taking any hydrocodone  because of fear of aggravation of persistent chronic constipation.  According to his caregiver he has a bowel movement approximately every 4 days despite using stool softeners twice daily .he has not tried fiber supplements including MiraLAX Metamucil Citrucel or fiber. .  Past Medical History  Diagnosis Date  . Diabetes mellitus   . Hypertension   . Stroke   . Degenerative disc disease, lumbar   . Gout   . Atrial fibrillation   . PVD (peripheral vascular disease)   . Aortic stenosis   . Hyperlipidemia   . Chronic kidney disease (CKD), stage III (moderate)    Current Outpatient Prescriptions on File  Prior to Visit  Medication Sig Dispense Refill  . amLODipine (NORVASC) 10 MG tablet Take 0.5 tablets (5 mg total) by mouth daily.      . cloNIDine (CATAPRES) 0.3 MG tablet Take 0.15-0.3 mg by mouth 4 (four) times daily. Take 1 tablet early AM and late PM. Take 1/2 tablet at [facility] and 1/2 tablet Early afternoon.  90 tablet  6  . docusate sodium (COLACE) 100 MG capsule Take 100 mg by mouth 3 (three) times daily as needed. For stool softner      . doxazosin (CARDURA) 2 MG tablet Take 4 mg by mouth at bedtime.       Marland Kitchen gemfibrozil (LOPID) 600 MG tablet Take 600 mg by mouth 2 (two) times daily before a meal.        . hydrALAZINE (APRESOLINE) 100 MG tablet Take 150 mg by mouth 4 (four) times daily.      . isosorbide mononitrate (IMDUR) 30 MG 24 hr tablet Take 120 mg by mouth 2 (two) times daily.      . Red Yeast Rice 600 MG TABS Take 1 tablet by mouth daily.       Marland Kitchen warfarin (COUMADIN) 5 MG tablet Take 5 mg by mouth daily. Adjust dose for goal INR 2.0-3.0.     5 mg on Sun, Thurs.  1.5 tablets on all other days         Review of Systems  HENT: Negative.  Eyes: Negative.   Respiratory: Negative.   Cardiovascular: Positive for leg swelling.  Gastrointestinal: Positive for constipation.  Musculoskeletal: Positive for arthralgias and gait problem.  Skin: Negative.   Neurological: Positive for weakness.       Speech difficulty  Hematological: Bruises/bleeds easily.  Psychiatric/Behavioral: Negative.   All other systems reviewed and are negative.       Objective:   Physical Exam  Constitutional: He is oriented to person, place, and time. He appears well-developed and well-nourished.  HENT:  Head: Normocephalic and atraumatic.  Mouth/Throat: Oropharynx is clear and moist.  Eyes: Conjunctivae and EOM are normal.  Neck: Normal range of motion. Neck supple. No JVD present. No thyromegaly present.  Cardiovascular: Normal rate, regular rhythm and normal heart sounds.   Pulmonary/Chest: Effort  normal and breath sounds normal. He has no wheezes. He has no rales.  Abdominal: Soft. Bowel sounds are normal. He exhibits no mass. There is no tenderness. There is no rebound.  Musculoskeletal: Normal range of motion. He exhibits no edema.  Neurological: He is alert and oriented to person, place, and time. A cranial nerve deficit is present.       Expressive aphasia  Skin: Skin is warm and dry.  Psychiatric: He has a normal mood and affect.       Assessment & Plan:   Aortic stenosis Mild to moderate by recent echocardiogram. There has been no discussion of valve replacement presumably because of his medical comorbidities including chronic renal insufficiency.  Hypertension Management limited choices due to his renal insufficiency and first degree AV block. Continue amlodipine clonidine and hydralazine.  Hyperlipidemia Currently managed with red yeast rice and gemfibrozil. He will return for fasting lipids with goal LDL of 70-100 given his concurrent diabetes  CKD (chronic kidney disease) Management by Dr. Cherylann Ratel. He is now receiving Procrit injections for anemia of chronic disease.    Updated Medication List Outpatient Encounter Prescriptions as of 07/27/2011  Medication Sig Dispense Refill  . amLODipine (NORVASC) 10 MG tablet Take 0.5 tablets (5 mg total) by mouth daily.      . cloNIDine (CATAPRES) 0.3 MG tablet Take 0.15-0.3 mg by mouth 4 (four) times daily. Take 1 tablet early AM and late PM. Take 1/2 tablet at [facility] and 1/2 tablet Early afternoon.  90 tablet  6  . docusate sodium (COLACE) 100 MG capsule Take 100 mg by mouth 3 (three) times daily as needed. For stool softner      . doxazosin (CARDURA) 2 MG tablet Take 4 mg by mouth at bedtime.       Marland Kitchen gemfibrozil (LOPID) 600 MG tablet Take 600 mg by mouth 2 (two) times daily before a meal.        . hydrALAZINE (APRESOLINE) 100 MG tablet Take 150 mg by mouth 4 (four) times daily.      . isosorbide mononitrate (IMDUR) 30 MG 24 hr  tablet Take 120 mg by mouth 2 (two) times daily.      . Red Yeast Rice 600 MG TABS Take 1 tablet by mouth daily.       Marland Kitchen warfarin (COUMADIN) 5 MG tablet Take 5 mg by mouth daily. Adjust dose for goal INR 2.0-3.0.     5 mg on Sun, Thurs.  1.5 tablets on all other days      . DISCONTD: allopurinol (ZYLOPRIM) 300 MG tablet Take 300 mg by mouth daily.        Marland Kitchen DISCONTD: clopidogrel (PLAVIX) 75 MG tablet Take 75 mg  by mouth daily.        Marland Kitchen DISCONTD: colchicine 0.6 MG tablet Take 0.6 mg by mouth daily.        Marland Kitchen DISCONTD: diclofenac sodium (VOLTAREN) 1 % GEL Apply 1 application topically 4 (four) times daily.        Marland Kitchen DISCONTD: glipiZIDE (GLUCOTROL) 10 MG 24 hr tablet Take 5 mg by mouth daily.       Marland Kitchen DISCONTD: losartan (COZAAR) 100 MG tablet Take 100 mg by mouth daily.

## 2011-07-27 NOTE — Patient Instructions (Addendum)
You Need a fiber supplement (miralax, citrucel,  Metamucil or benefiber)  . You can put it in his mid morning water (do not take with medicines)     He also needs a stimulant laxative like Ex lax, dulcolax, or Sennakot S at bedtime every night that he has taken a vicodin for pain control.  I recommend that you start with 2 vicodin daily:  One in the morning and one after dinner.   I would substitute glucerna or Atkins Advantqge protein shakes for lower sugar content .    We are repeating the urine test today and will repeat his PSA in a 3 months,  If it is still elevated,  We will refer you to a urologist.  I will see you again in 3 months and will recheck you [id number]  ,fasting lipids and liver/kidney function

## 2011-07-27 NOTE — Assessment & Plan Note (Signed)
Management by Dr. Cherylann Ratel. He is now receiving Procrit injections for anemia of chronic disease.

## 2011-07-27 NOTE — Assessment & Plan Note (Signed)
Management limited choices due to his renal insufficiency and first degree AV block. Continue amlodipine clonidine and hydralazine.

## 2011-07-27 NOTE — Assessment & Plan Note (Signed)
Mild to moderate by recent echocardiogram. There has been no discussion of valve replacement presumably because of his medical comorbidities including chronic renal insufficiency.

## 2011-07-30 ENCOUNTER — Encounter: Payer: Self-pay | Admitting: Internal Medicine

## 2011-07-30 ENCOUNTER — Ambulatory Visit: Payer: Self-pay | Admitting: Oncology

## 2011-07-30 ENCOUNTER — Encounter: Payer: Self-pay | Admitting: Family Medicine

## 2011-07-30 DIAGNOSIS — I428 Other cardiomyopathies: Secondary | ICD-10-CM | POA: Insufficient documentation

## 2011-07-30 DIAGNOSIS — I701 Atherosclerosis of renal artery: Secondary | ICD-10-CM | POA: Insufficient documentation

## 2011-07-31 LAB — CULTURE, URINE COMPREHENSIVE: Colony Count: 80000

## 2011-08-01 ENCOUNTER — Other Ambulatory Visit: Payer: Self-pay | Admitting: Internal Medicine

## 2011-08-01 DIAGNOSIS — B952 Enterococcus as the cause of diseases classified elsewhere: Secondary | ICD-10-CM | POA: Insufficient documentation

## 2011-08-01 MED ORDER — NITROFURANTOIN MONOHYD MACRO 100 MG PO CAPS: 100.0000 mg | ORAL_CAPSULE | Freq: Two times a day (BID) | ORAL | Status: AC

## 2011-08-01 NOTE — Progress Notes (Signed)
Addended by: Duncan Dull on: 08/01/2011 10:02 AM   Modules accepted: Orders

## 2011-08-01 NOTE — Assessment & Plan Note (Signed)
He was treated by other PCP with levaquin empirically.  Our culture grew enterococcus  Sensitive to levaquin and macrobid.  Will assume levaquin is not effective at this point and rx macrobid x 10 days followed by repeat ua and culture.

## 2011-08-04 ENCOUNTER — Ambulatory Visit (INDEPENDENT_AMBULATORY_CARE_PROVIDER_SITE_OTHER): Payer: Medicare Other

## 2011-08-04 DIAGNOSIS — Z7901 Long term (current) use of anticoagulants: Secondary | ICD-10-CM

## 2011-08-04 DIAGNOSIS — I6350 Cerebral infarction due to unspecified occlusion or stenosis of unspecified cerebral artery: Secondary | ICD-10-CM

## 2011-08-04 DIAGNOSIS — I4891 Unspecified atrial fibrillation: Secondary | ICD-10-CM

## 2011-08-04 DIAGNOSIS — I639 Cerebral infarction, unspecified: Secondary | ICD-10-CM

## 2011-08-04 LAB — POCT INR: INR: 2.8

## 2011-08-05 ENCOUNTER — Other Ambulatory Visit: Payer: Self-pay | Admitting: *Deleted

## 2011-08-05 MED ORDER — WARFARIN SODIUM 5 MG PO TABS: 5.0000 mg | ORAL_TABLET | Freq: Every day | ORAL | Status: DC

## 2011-08-12 ENCOUNTER — Telehealth: Payer: Self-pay | Admitting: Cardiovascular Disease

## 2011-08-12 NOTE — Telephone Encounter (Signed)
Error

## 2011-08-13 ENCOUNTER — Telehealth: Payer: Self-pay | Admitting: Internal Medicine

## 2011-08-13 ENCOUNTER — Ambulatory Visit (INDEPENDENT_AMBULATORY_CARE_PROVIDER_SITE_OTHER): Payer: Medicare Other | Admitting: Internal Medicine

## 2011-08-13 ENCOUNTER — Encounter: Payer: Self-pay | Admitting: Internal Medicine

## 2011-08-13 VITALS — BP 122/50 | HR 61 | Temp 98.2°F | Resp 16 | Wt 218.5 lb

## 2011-08-13 DIAGNOSIS — I639 Cerebral infarction, unspecified: Secondary | ICD-10-CM

## 2011-08-13 DIAGNOSIS — I6350 Cerebral infarction due to unspecified occlusion or stenosis of unspecified cerebral artery: Secondary | ICD-10-CM

## 2011-08-13 DIAGNOSIS — N390 Urinary tract infection, site not specified: Secondary | ICD-10-CM

## 2011-08-13 DIAGNOSIS — I10 Essential (primary) hypertension: Secondary | ICD-10-CM

## 2011-08-13 LAB — POCT URINALYSIS DIPSTICK
Blood, UA: NEGATIVE
Glucose, UA: NEGATIVE
Nitrite, UA: NEGATIVE
Urobilinogen, UA: 0.2
pH, UA: 5.5

## 2011-08-13 NOTE — Assessment & Plan Note (Signed)
Per dr Mariah Milling,  He is supposed to take clonidine,  2 Imdurs and 1.5 hydralazine at 5 am. Gets up at 9 am for breakfast and takes 5 mg amlodipine.  At 1Pm  his lunch he takes 1/2 clonidine and 1.5 hydralazine. Wife returns home anywhere from 8 to 10 Pm and with dinner he takes 2 imdur and 1.5 hydralazine .  At bedtime or around 1 PM,  1 whole clonidine,  1.5 hydralazine and doxazosin.

## 2011-08-13 NOTE — Patient Instructions (Signed)
We are going to reduce the clonidine gradually.  Change regimen to 1/2 tabl at 5 am,  None at 1 pm  And 1/2 at bedtime.  If bp at 9 am is < 130,  Do not take the amlodipine that morning.    We will try this for one week and see how the reduction in clonidine works.

## 2011-08-13 NOTE — Telephone Encounter (Signed)
[facility] Triage Call Report Triage Record Num: [id number] Operator: Di Kindle Patient Name: Scott Norman Call Date & Time: 08/12/2011 6:23:23PM Patient Phone: [phone] PCP: Duncan Dull Patient Gender: Male PCP Fax : [phone] Patient DOB: 05/26/1931 Practice Name: [facility] Reason for Call: Caller: Lisa/daughter; PCP: Duncan Dull; CB#: [phone]; Call regarding BP 172/57 @ 1745 (normal for this pt) , but this morning it was 97/38 also has AFIB; @ 1345 149/51. Feeling more tired. Guideline: HTN, with continued edema of ankles, no recent change in RX, family requesting Home Health. With concern over chages in BP, requests appt for 08/13/11, same made 1015 wth Dr Darrick Huntsman. Protocol(s) Used: Hypertension, Diagnosed or Suspected Recommended Outcome per Protocol: See Provider within 72 Hours Reason for Outcome: Multiple elevated blood pressure readings without other symptoms AND no previous work-up OR readings exceed expected range defined by treatment plan Care Advice: It is important to have blood pressure checked at least annually, or at each provider visit, especially if you have a history of high blood pressure in your family. ~ ~ Call provider if systolic BP is 180 or greater, or if diastolic BP is 120 or greater. Medication Advice: - Discontinue all nonprescription and alternative medications, especially stimulants, until evaluated by provider. - Take prescribed medications as directed, following label instructions for the medication. - Do not change medications or dosing regimen until provider is consulted. - Know possible side effects of medication and what to do if they occur. - Tell provider all prescription, nonprescription or alternative medications that you take ~ 08/12/2011 6:44:01PM Page 1 of 1 [email or web address]

## 2011-08-13 NOTE — Telephone Encounter (Signed)
Patient has appt today.

## 2011-08-15 ENCOUNTER — Encounter: Payer: Self-pay | Admitting: Internal Medicine

## 2011-08-15 DIAGNOSIS — I701 Atherosclerosis of renal artery: Secondary | ICD-10-CM | POA: Insufficient documentation

## 2011-08-15 NOTE — Progress Notes (Signed)
Patient ID: Scott Norman, male   DOB: [birth year], 78 y.o.   MRN: [id number]  Patient Active Problem List  Diagnoses  . Atrial fibrillation  . Aortic stenosis  . Hyperlipidemia  . Hypertension  . Bradycardia  . CKD (chronic kidney disease)  . CKD (chronic kidney disease)  . Encounter for long-term (current) use of anticoagulants  . CVA (cerebrovascular accident)  . Edema  . Atrial flutter  . Type II diabetes mellitus with nephropathy  . Right renal artery stenosis  . Valvular cardiomyopathy  . Screening for osteoporosis  . Urinary tract infection due to Enterococcus  . Renal artery stenosis, native, bilateral    Subjective:  CC:   Chief Complaint  Patient presents with  . Hypertension    HPI:   Scott Norman a 78 y.o. male with a history of diabetes mellitus, hypertension, peripheral vascular disease, and recent embolic CVA with residual expressive aphasia who presents with a recent  history of labile blood pressures including hypotensive episodes which have left him very weak.  Patient is unattended for long periods of time by wife and is brought in today by his daughter Scott Norman and a friend of family due to concerns about his safety in his current living situation..  He has not fallen at home, but per daughter is often left until 8 or 9 PM by wife , who left him alone yesterday for six hours  despite having a bp of 90/50.  The patient has a history of renal artery stenosis with one-sided remaining to be stented and is on a complicated hypertensive medication regimen which was outlined by daughter today per instructions from Dr. Mariah Milling, his cardiologist. He has not had any trouble following these instructions as his daughter does assist with medication administration using pill boxes.   Review of Systems:  Pertinent review of systems addressed in the HPI,   The following portions of the patient's history were reviewed and updated as appropriate: Allergies, current  medications, and problem list.  Past Medical History  Diagnosis Date  . Diabetes mellitus   . Hypertension   . Degenerative disc disease, lumbar   . Gout   . Atrial fibrillation   . PVD (peripheral vascular disease)   . Aortic stenosis   . Hyperlipidemia   . Chronic kidney disease (CKD), stage III (moderate)   . H/O colonoscopy June 2007    normal  . Sleep apnea   . Screening for osteoporosis Feb 2012    T -1.3 hip  . Stroke 2012    Rmbloic, with expressive aphasia  . Renal artery stenosis, native, bilateral     s/p right RA stent 2012, pending left    Current Outpatient Prescriptions on File Prior to Visit  Medication Sig Dispense Refill  . amLODipine (NORVASC) 10 MG tablet Take 0.5 tablets (5 mg total) by mouth daily.      . cloNIDine (CATAPRES) 0.3 MG tablet Take 0.15-0.3 mg by mouth 4 (four) times daily. Take 1 tablet early AM and late PM. Take 1/2 tablet at [facility] and 1/2 tablet Early afternoon.  90 tablet  6  . docusate sodium (COLACE) 100 MG capsule Take 100 mg by mouth 3 (three) times daily as needed. For stool softner      . doxazosin (CARDURA) 2 MG tablet Take 4 mg by mouth at bedtime.       Marland Kitchen gemfibrozil (LOPID) 600 MG tablet Take 600 mg by mouth 2 (two) times daily before a meal.        .  hydrALAZINE (APRESOLINE) 100 MG tablet Take 150 mg by mouth 4 (four) times daily.      . isosorbide mononitrate (IMDUR) 30 MG 24 hr tablet Take 120 mg by mouth 2 (two) times daily.      . Red Yeast Rice 600 MG TABS Take 1 tablet by mouth daily.       Marland Kitchen warfarin (COUMADIN) 5 MG tablet Take 1 tablet (5 mg total) by mouth daily. Adjust dose for goal INR 2.0-3.0.     5 mg on Sun, Thurs.  1.5 tablets on all other days  45 tablet  1     History   Social History Narrative   Lives with second wife,  often left alone for hours on end.  ,  Meals delayed by wife's absence from hone until 7 or 8 PM.  Daughter Scott Norman actively involved , Ambulates with walker.     Objective:  BP 122/50   Pulse 61  Temp(Src) 98.2 F (36.8 C) (Oral)  Resp 16  Wt 218 lb 8 oz (99.111 kg)  SpO2 100%  General:  Well-developed, well-nourished. No acute distress General appearance: alert, cooperative and appears stated age Neck: no adenopathy, no carotid bruit, no JVD, supple, symmetrical, trachea midline and thyroid not enlarged, symmetric, no tenderness/mass/nodules Lungs: clear to auscultation bilaterally Chest wall: no tenderness Heart: irregularly irregular rhythm Extremities: extremities normal, atraumatic, no cyanosis or edema   Assessment:  1. Hypertension  Ambulatory referral to Home Health  2. CVA (cerebral infarction)  Ambulatory referral to Home Health  3. UTI (lower urinary tract infection)  POCT urinalysis dipstick, Urine Culture     Plan:  Reduce clonidine dose by 50% (3 daily doses reduced by 50% each) and followup by phoen with BP log in one week.  Home Health referral to [facility] for assessment of patient needs at home since wife is not responsible and patient is homebound.  Repeat UA/culture to assess for resolution of previous asymptomatic Enterococcus infection treated initially by another doctor with Levaquin followed by Macrobid for persistent infection  . Orders Placed This Encounter  Procedures  . Urine Culture  . Ambulatory referral to Home Health  . POCT urinalysis dipstick    No Follow-up on file.   Medications:   Current Outpatient Prescriptions  Medication Sig Dispense Refill  .  amLODipine (NORVASC) 10 MG tabletTake 0.5 tablets (5 mg total) by mouth daily. Hold for sbp < 130      . cloNIDine (CATAPRES) 0.3 MG tablet Take 1/2 tablet  In early AM,  None at 1 PM, andn 1/2 tablet in the evening  90 tablet  6  . docusate sodium (COLACE) 100 MG capsule Take 100 mg by mouth 3 (three) times daily as needed. For stool softner      . doxazosin (CARDURA) 2 MG tablet Take 4 mg by mouth at bedtime.       Marland Kitchen gemfibrozil (LOPID) 600 MG tablet Take 600 mg by  mouth 2 (two) times daily before a meal.        . hydrALAZINE (APRESOLINE) 100 MG tablet Take 150 mg by mouth 4 (four) times daily.      . isosorbide mononitrate (IMDUR) 30 MG 24 hr tablet Take 120 mg by mouth 2 (two) times daily.      . nitrofurantoin (MACRODANTIN) 100 MG capsule Take 100 mg by mouth 2 (two) times daily.      . Red Yeast Rice 600 MG TABS Take 1 tablet by mouth daily.       Marland Kitchen  warfarin (COUMADIN) 5 MG tablet Take 1 tablet (5 mg total) by mouth daily. Adjust dose for goal INR 2.0-3.0.     5 mg on Sun, Thurs.  1.5 tablets on all other days  45 tablet  1

## 2011-08-16 ENCOUNTER — Other Ambulatory Visit: Payer: Self-pay | Admitting: Internal Medicine

## 2011-08-16 MED ORDER — HYDROCODONE-ACETAMINOPHEN 5-325 MG PO TABS: ORAL_TABLET | ORAL | Status: DC

## 2011-08-18 ENCOUNTER — Telehealth: Payer: Self-pay | Admitting: Internal Medicine

## 2011-08-18 MED ORDER — HYDROCODONE-ACETAMINOPHEN 5-325 MG PO TABS: ORAL_TABLET | ORAL | Status: DC

## 2011-08-18 NOTE — Telephone Encounter (Signed)
Yes, Rx has been called in.  Patients daughter notified.

## 2011-08-18 NOTE — Telephone Encounter (Signed)
Scott Norman Pt  Daughter called to get pain meds for knee [place]  They cut down on his clonidine and his bp was doing good until yesterday. It was running high 171/57 yesterday but daughter stated he was in a lot of pain She is going to get a weeks worth of bp and send it to you. Pt wife had hydrocodone and they gave him one at 9pm and 1am and he slept better  Pain meds make him constipated His not eating very well his appetite is not good Bowl of cereal yesterday am and 1/2 bowl tomato soup for supper  [name] thought there was someone from home health to come out

## 2011-08-18 NOTE — Telephone Encounter (Signed)
Patients daughter stated patient needs an Rx for knee pain.  She stated you know about the pain and that he needs a knee replacement but is not physically able to do so at this time.  She wanted know if you would call something in.  Please advise.

## 2011-08-18 NOTE — Telephone Encounter (Signed)
i believe he just had a vicodin rxs refilled yesterday. Pl confirm

## 2011-08-25 ENCOUNTER — Ambulatory Visit (INDEPENDENT_AMBULATORY_CARE_PROVIDER_SITE_OTHER): Payer: Medicare Other

## 2011-08-25 DIAGNOSIS — Z7901 Long term (current) use of anticoagulants: Secondary | ICD-10-CM

## 2011-08-25 DIAGNOSIS — I4891 Unspecified atrial fibrillation: Secondary | ICD-10-CM

## 2011-08-25 DIAGNOSIS — I639 Cerebral infarction, unspecified: Secondary | ICD-10-CM

## 2011-08-25 DIAGNOSIS — I6350 Cerebral infarction due to unspecified occlusion or stenosis of unspecified cerebral artery: Secondary | ICD-10-CM

## 2011-08-26 ENCOUNTER — Telehealth: Payer: Self-pay | Admitting: Internal Medicine

## 2011-08-26 ENCOUNTER — Telehealth: Payer: Self-pay | Admitting: Cardiovascular Disease

## 2011-08-26 MED ORDER — FUROSEMIDE 20 MG PO TABS: 20.0000 mg | ORAL_TABLET | Freq: Every day | ORAL | Status: DC | PRN

## 2011-08-26 NOTE — Telephone Encounter (Signed)
I received Lisa's message about her Dad's BP. His bp is definitely labile. Has he ever tried a higher dose of amlodipine ,  Raising it to 10 mg might enable [place] to get reduce the clonidine as we had originally tried.

## 2011-08-26 NOTE — Telephone Encounter (Signed)
Pt daughter calling stating that pt BP is running low. 125/37 around 9:00. Pt is taking melatonin at night and wonders if this is causing it

## 2011-08-26 NOTE — Telephone Encounter (Signed)
Scott Norman is complaining of feeling tired and weak this am with a bp of 125/37.  BP yesterday was 145/54.  On Tuesday his BP was 177/62 and 182/64.  Pulse is running in the low to mid 60's.  2 weeks ago his bp was running low and Dr Darrick Huntsman told him not to take his amlodipine if his systolic bp is less than 130.  His normal meds are as follows: Isosorbide 120mg  bid Clonidine 0.3mg  one half tab qid  (they gave him a whole tab yesterday am at 5:00 due to high bp on Tues & Wed Hydralazine 150mg  qd Amlodipine 5mg  qam (hold if bp less than 130 systolic)  They are concerned that it is too low right now especially with the tired and weak feeling.  They would like advice on what to do.  Scott Apollo daughter also wants to know if the melatonin he takes at night could be causing a problem with his BP?

## 2011-08-26 NOTE — Telephone Encounter (Signed)
Patient has been taking 5mg  of amlodipine, but Daughter says that he has tried the 10 mg in the past. She says that she still has some of the 10mg  left, but she says that when he was taking the 10 he has more swelling in his ankles. Also she says that his blood pressure is up to 140/46. She is asking if you would like for him to try the 10 mg

## 2011-08-26 NOTE — Telephone Encounter (Signed)
Melatonin could be the problem Would stop his first before stopping blood pressure medications

## 2011-08-26 NOTE — Telephone Encounter (Signed)
It would be worth a try, to get him off of the clonidine which is so short acting that without it he rebounds.  We can add a mild diuretic if the amlodipine causes ankle swelling. I will call on in to his pharmacy in case they need it over the long weekend.

## 2011-08-30 ENCOUNTER — Ambulatory Visit: Payer: Self-pay | Admitting: Oncology

## 2011-08-30 NOTE — Telephone Encounter (Signed)
Left message notifying daughter.

## 2011-08-30 NOTE — Telephone Encounter (Signed)
Spoke with daughter and she is aware of Dr Marylou Flesher suggestions and will stop the Melatonin

## 2011-09-01 ENCOUNTER — Other Ambulatory Visit: Payer: Self-pay | Admitting: Cardiovascular Disease

## 2011-09-07 ENCOUNTER — Ambulatory Visit: Payer: Medicare Other | Admitting: Internal Medicine

## 2011-09-10 ENCOUNTER — Ambulatory Visit (INDEPENDENT_AMBULATORY_CARE_PROVIDER_SITE_OTHER): Payer: Medicare Other | Admitting: Internal Medicine

## 2011-09-10 ENCOUNTER — Telehealth: Payer: Self-pay | Admitting: *Deleted

## 2011-09-10 ENCOUNTER — Encounter: Payer: Self-pay | Admitting: Internal Medicine

## 2011-09-10 VITALS — BP 162/54 | HR 42 | Temp 98.2°F | Resp 14 | Wt 220.8 lb

## 2011-09-10 DIAGNOSIS — I701 Atherosclerosis of renal artery: Secondary | ICD-10-CM

## 2011-09-10 DIAGNOSIS — I10 Essential (primary) hypertension: Secondary | ICD-10-CM

## 2011-09-10 MED ORDER — DIAZEPAM 5 MG PO TABS: 5.0000 mg | ORAL_TABLET | Freq: Two times a day (BID) | ORAL | Status: AC | PRN
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
## 2011-09-10 NOTE — Assessment & Plan Note (Addendum)
A Labile ,  With 10 mg of amlodipine in the past causing ankle edema so he is staying with 5 mg .  He has renal artery stenosis, bilateral, with one side stented, and has an appt in June with [facility] to review other side.  Currently her bp is aggravated by marital stressors.  Since his bp is actually at times controlled.  Will treat anxiety  with valium.

## 2011-09-10 NOTE — Patient Instructions (Signed)
Start with 1/2 valium tablet with breakfast and dinner.   No adjustments to medications for blood pressure until we see how much the valium is helping .

## 2011-09-10 NOTE — Telephone Encounter (Signed)
Triage Record Num: [id number] Operator: Lesli Albee Patient Name: Scott Norman Call Date & Time: 09/10/2011 9:09:14AM Patient Phone: [phone] PCP: Duncan Dull Patient Gender: Male PCP Fax : [phone] Patient DOB: 03/06/1931 Practice Name: [facility] Reason for Call: Caller: Lisa/Sibling; PCP: Duncan Dull; CB#: [phone]; ; ; Call regarding Elevated Blood Pressure Daughter Has Concerts.; [name] is alling about her Fathers BP being elevated. Last night the pts BP readings were 220/78 last night and have been jumping up randomly. This am the BP is 141/55. The daughter wanted to fax over BP readings today for the MD to review and call her back. Rn advsied if pt needing MD time/pt should come for a visit. Pt scheduled at 09:30 with Dr. Darrick Huntsman Protocol(s) Used: Hypertension, Diagnosed or Suspected Recommended Outcome per Protocol: Call Provider within 8 Hours Reason for Outcome: Sudden drop in blood pressure (75mm/Hg or more than usual reading) AND recent change in prescription, nonprescription, or alternative medications or their dosages. Care Advice: ~ HEALTH PROMOTION / MAINTENANCE     Patient has an appt with Dr. Darrick Huntsman today.  04/

## 2011-09-12 ENCOUNTER — Encounter: Payer: Self-pay | Admitting: Internal Medicine

## 2011-09-14 ENCOUNTER — Encounter: Payer: Self-pay | Admitting: Internal Medicine

## 2011-09-14 ENCOUNTER — Ambulatory Visit (INDEPENDENT_AMBULATORY_CARE_PROVIDER_SITE_OTHER): Payer: Medicare Other | Admitting: Internal Medicine

## 2011-09-14 DIAGNOSIS — E119 Type 2 diabetes mellitus without complications: Secondary | ICD-10-CM

## 2011-09-14 DIAGNOSIS — I10 Essential (primary) hypertension: Secondary | ICD-10-CM

## 2011-09-14 DIAGNOSIS — S90129A Contusion of unspecified lesser toe(s) without damage to nail, initial encounter: Secondary | ICD-10-CM

## 2011-09-14 DIAGNOSIS — R609 Edema, unspecified: Secondary | ICD-10-CM

## 2011-09-14 DIAGNOSIS — M25561 Pain in right knee: Secondary | ICD-10-CM

## 2011-09-14 DIAGNOSIS — M25569 Pain in unspecified knee: Secondary | ICD-10-CM

## 2011-09-14 MED ORDER — HYDROCODONE-ACETAMINOPHEN 10-500 MG PO TABS: 1.0000 | ORAL_TABLET | Freq: Four times a day (QID) | ORAL | Status: AC | PRN

## 2011-09-14 MED ORDER — GLUCOSE BLOOD VI STRP: ORAL_STRIP | Status: AC

## 2011-09-14 MED ORDER — GLUCOSE BLOOD VI STRP: ORAL_STRIP | Status: DC

## 2011-09-14 NOTE — Progress Notes (Signed)
Patient ID: Scott Norman, male   DOB: Nov 09, 1930, 78 y.o.   MRN: [id number]   Patient Active Problem List  Diagnoses  . Atrial fibrillation  . Aortic stenosis  . Hyperlipidemia  . Hypertension  . Bradycardia  . CKD (chronic kidney disease)  . CKD (chronic kidney disease)  . Encounter for long-term (current) use of anticoagulants  . CVA (cerebrovascular accident)  . Edema  . Atrial flutter  . Type II diabetes mellitus with nephropathy  . Right renal artery stenosis  . Valvular cardiomyopathy  . Screening for osteoporosis  . Urinary tract infection due to Enterococcus  . Renal artery stenosis, native, bilateral  . Bruise of toe  . Knee pain, right    Subjective:  CC:   No chief complaint on file.   HPI:   Scott Norman a 78 y.o. male who presents  new onset painless bruising noted between 1st and 2nd toe on left, with no prior history of trauma..  He has had increased knee pain and has been asking for vicodin more frequently.  Not sedated with recent increase to 2 daily .  Increased edema noted in both extremities.  Caregiver concerned that wife is taking his Vicodin and using it for her own means.    Past Medical History  Diagnosis Date  . Diabetes mellitus   . Hypertension   . Degenerative disc disease, lumbar   . Gout   . Atrial fibrillation   . PVD (peripheral vascular disease)   . Aortic stenosis   . Hyperlipidemia   . Chronic kidney disease (CKD), stage III (moderate)   . H/O colonoscopy June 2007    normal  . Sleep apnea   . Screening for osteoporosis Feb 2012    T -1.3 hip  . Stroke 2012    Rmbloic, with expressive aphasia  . Renal artery stenosis, native, bilateral     s/p right RA stent 2012, pending left     Past Surgical History  Procedure Date  . Shoulder surgery   . Lumbar spine surgery          The following portions of the patient's history were reviewed and updated as appropriate: Allergies, current medications, and problem  list.    Review of Systems:   12 Pt  review of systems was negative except those addressed in the HPI,     History   Social History  . Marital Status: Married    Spouse Name: N/A    Number of Children: N/A  . Years of Education: N/A   Occupational History  . retired    Social History Main Topics  . Smoking status: Never Smoker   . Smokeless tobacco: Never Used  . Alcohol Use: 1.2 oz/week    2 Glasses of wine per week     rarely  . Drug Use: No  . Sexually Active: No   Other Topics Concern  . Not on file   Social History Narrative   Lives with second wife,  often left alone for hours on end.  ,  Meals delayed by wife's absence from hone until 7 or 8 PM.  Daughter [name] actively involved , Ambulates with walker.     Objective:  BP 150/60  Pulse 55  Temp(Src) 98.6 F (37 C) (Oral)  Resp 14  Wt 229 lb 4 oz (103.987 kg)  SpO2 98%  General appearance: alert, cooperative and appears stated age Ears: normal TM's and external ear canals both ears Throat: lips,  mucosa, and tongue normal; teeth and gums normal Neck: no adenopathy, no carotid bruit, supple, symmetrical, trachea midline and thyroid not enlarged, symmetric, no tenderness/mass/nodules Back: symmetric, no curvature. ROM normal. No CVA tenderness. Lungs: clear to auscultation bilaterally Heart: regular rate and rhythm, S1, S2 normal, no murmur, click, rub or gallop Abdomen: soft, non-tender; bowel sounds normal; no masses,  no organomegaly Pulses: 2+ and symmetric Skin: Skin color, texture, turgor normal. No rashes or lesions. Small bruise at base of 1st and 2nd toe on left foot.  Lymph nodes: Cervical, supraclavicular, and axillary nodes normal.  Assessment and Plan:  Edema Managed with furosemide 20 mg daily.  Wt has been stable at 216 at home.  He has CKD stage III managed by [facility].  Ok to increase lasix to 40 mg for 3 days only   Bruise of toe He does take coumadin but has no bruising  elsewhere and has regular follow up with cardiology /coumadin clinic. Bruise is minimal .  Reassurance provided that unless he starts having diffuse bruising , he does not need PT/INR rechecked before th appropriate interval.   Hypertension Labile and brittle ,  Secondary to anxiety and renal artery stenosis.  Home bps have been improved since starting medication for anxiety.  No changes today .  Knee pain, right Secondary to DJD,  Awaiting joint replacement which is deferred until his anemia improves.  Will increase Vicodin administration to 4 times daily.     Updated Medication List Outpatient Encounter Prescriptions as of 09/14/2011  Medication Sig Dispense Refill  . amLODipine (NORVASC) 10 MG tablet Take 0.5 tablets (5 mg total) by mouth daily.      . cloNIDine (CATAPRES) 0.3 MG tablet Take 0.15-0.3 mg by mouth 4 (four) times daily. Take 1 tablet early AM and late PM. Take 1/2 tablet at [facility] and 1/2 tablet Early afternoon.  90 tablet  6  . diazepam (VALIUM) 5 MG tablet Take 1 tablet (5 mg total) by mouth every 12 (twelve) hours as needed for anxiety.  60 tablet  1  . docusate sodium (COLACE) 100 MG capsule Take 100 mg by mouth 3 (three) times daily as needed. For stool softner      . doxazosin (CARDURA) 2 MG tablet Take 4 mg by mouth at bedtime.       . furosemide (LASIX) 20 MG tablet Take 1 tablet (20 mg total) by mouth daily as needed (for edema).  30 tablet  3  . gemfibrozil (LOPID) 600 MG tablet Take 600 mg by mouth 2 (two) times daily before a meal.        . hydrALAZINE (APRESOLINE) 100 MG tablet Take 150 mg by mouth 4 (four) times daily.      . isosorbide mononitrate (IMDUR) 30 MG 24 hr tablet Take 120 mg by mouth 2 (two) times daily.      . nitrofurantoin (MACRODANTIN) 100 MG capsule Take 100 mg by mouth 2 (two) times daily.      . Red Yeast Rice 600 MG TABS Take 1 tablet by mouth daily.       Marland Kitchen warfarin (COUMADIN) 5 MG tablet Take 1 tablet (5 mg total) by mouth daily. Adjust dose  for goal INR 2.0-3.0.     5 mg on Sun, Thurs.  1.5 tablets on all other days  45 tablet  1  . DISCONTD: HYDROcodone-acetaminophen (NORCO) 5-325 MG per tablet Take one to two tablets every six hours as needed for pain  90 tablet  3  .  glucose blood (BAYER CONTOUR NEXT TEST) test strip Use as instructed  100 each  12  . HYDROcodone-acetaminophen (LORTAB 10) 10-500 MG per tablet Take 1 tablet by mouth every 6 (six) hours as needed for pain.  120 tablet  3  . DISCONTD: glucose blood (BAYER CONTOUR NEXT TEST) test strip Use as instructed  100 each  12     No orders of the defined types were placed in this encounter.    No Follow-up on file.

## 2011-09-14 NOTE — Assessment & Plan Note (Addendum)
Managed with furosemide 20 mg daily.  Wt has been stable at 216 at home.  He has CKD stage III managed by [facility].  Ok to increase lasix to 40 mg for 3 days only

## 2011-09-14 NOTE — Patient Instructions (Signed)
Increase the fluid pill (furosemide) to 40 mg daily for just 3 days,  Then reduce to 20 mg daily    We are increasing the strength of the pain medication to 10 mg so you can take one every 6 hours (max of 4 daily)  You may need to increase your laxative from MOM to dulcolax or Sennakot s if you get cosnstipated

## 2011-09-16 ENCOUNTER — Encounter: Payer: Self-pay | Admitting: Internal Medicine

## 2011-09-16 DIAGNOSIS — M25561 Pain in right knee: Secondary | ICD-10-CM | POA: Insufficient documentation

## 2011-09-16 DIAGNOSIS — S90129A Contusion of unspecified lesser toe(s) without damage to nail, initial encounter: Secondary | ICD-10-CM | POA: Insufficient documentation

## 2011-09-16 NOTE — Assessment & Plan Note (Signed)
Secondary to DJD,  Awaiting joint replacement which is deferred until his anemia improves.  Will increase Vicodin administration to 4 times daily.

## 2011-09-16 NOTE — Assessment & Plan Note (Signed)
Labile and brittle ,  Secondary to anxiety and renal artery stenosis.  Home bps have been improved since starting medication for anxiety.  No changes today .

## 2011-09-16 NOTE — Assessment & Plan Note (Signed)
He does take coumadin but has no bruising elsewhere and has regular follow up with cardiology /coumadin clinic. Bruise is minimal .  Reassurance provided that unless he starts having diffuse bruising , he does not need PT/INR rechecked before th appropriate interval.

## 2011-09-17 LAB — CANCER CENTER HEMOGLOBIN: HGB: 10.1 g/dL — ABNORMAL LOW (ref 13.0–18.0)

## 2011-09-22 ENCOUNTER — Telehealth: Payer: Self-pay | Admitting: Internal Medicine

## 2011-09-22 ENCOUNTER — Ambulatory Visit (INDEPENDENT_AMBULATORY_CARE_PROVIDER_SITE_OTHER): Payer: Medicare Other

## 2011-09-22 DIAGNOSIS — Z7901 Long term (current) use of anticoagulants: Secondary | ICD-10-CM

## 2011-09-22 DIAGNOSIS — I639 Cerebral infarction, unspecified: Secondary | ICD-10-CM

## 2011-09-22 DIAGNOSIS — I6350 Cerebral infarction due to unspecified occlusion or stenosis of unspecified cerebral artery: Secondary | ICD-10-CM

## 2011-09-22 DIAGNOSIS — I4891 Unspecified atrial fibrillation: Secondary | ICD-10-CM

## 2011-09-22 LAB — POCT INR: INR: 2.8

## 2011-09-22 NOTE — Telephone Encounter (Signed)
[name] mr Tiffany daughter called Pt has been taking 1/2 valium at breakfast lunch dinner and at bed time trying to cut the clinidine to 2 a day.  His bp last night 193/68.  He has lots of stress and tension at home.  His spouse is home recovery from hip replacement Please advise lisa on what to do [phone]

## 2011-09-23 MED ORDER — ENALAPRIL MALEATE 5 MG PO TABS: 5.0000 mg | ORAL_TABLET | Freq: Every day | ORAL | Status: DC

## 2011-09-23 NOTE — Telephone Encounter (Signed)
Ok, chart updated

## 2011-09-23 NOTE — Telephone Encounter (Signed)
I talked to [name] she stated Dr. Cherylann Ratel added enalapril 5 mg to patients medications, and he will follow up with him in one week.  She will send new BP readings after a few days of being on the medication.

## 2011-09-28 ENCOUNTER — Other Ambulatory Visit: Payer: Self-pay

## 2011-09-28 MED ORDER — WARFARIN SODIUM 5 MG PO TABS: ORAL_TABLET | ORAL | Status: DC

## 2011-09-29 ENCOUNTER — Ambulatory Visit: Payer: Self-pay | Admitting: Oncology

## 2011-10-04 ENCOUNTER — Ambulatory Visit (INDEPENDENT_AMBULATORY_CARE_PROVIDER_SITE_OTHER): Payer: Medicare Other | Admitting: Cardiovascular Disease

## 2011-10-04 ENCOUNTER — Encounter: Payer: Self-pay | Admitting: Cardiovascular Disease

## 2011-10-04 VITALS — BP 133/68 | HR 53 | Ht 72.0 in | Wt 233.0 lb

## 2011-10-04 DIAGNOSIS — N189 Chronic kidney disease, unspecified: Secondary | ICD-10-CM

## 2011-10-04 DIAGNOSIS — E785 Hyperlipidemia, unspecified: Secondary | ICD-10-CM

## 2011-10-04 DIAGNOSIS — I4891 Unspecified atrial fibrillation: Secondary | ICD-10-CM

## 2011-10-04 DIAGNOSIS — Z01810 Encounter for preprocedural cardiovascular examination: Secondary | ICD-10-CM

## 2011-10-04 DIAGNOSIS — I5033 Acute on chronic diastolic (congestive) heart failure: Secondary | ICD-10-CM

## 2011-10-04 DIAGNOSIS — I701 Atherosclerosis of renal artery: Secondary | ICD-10-CM

## 2011-10-04 DIAGNOSIS — I350 Nonrheumatic aortic (valve) stenosis: Secondary | ICD-10-CM

## 2011-10-04 DIAGNOSIS — R609 Edema, unspecified: Secondary | ICD-10-CM

## 2011-10-04 DIAGNOSIS — I10 Essential (primary) hypertension: Secondary | ICD-10-CM

## 2011-10-04 MED ORDER — FUROSEMIDE 40 MG PO TABS: 40.0000 mg | ORAL_TABLET | Freq: Two times a day (BID) | ORAL | Status: DC

## 2011-10-04 NOTE — Assessment & Plan Note (Signed)
Renal artery stenting in the past, we will reevaluate annually with renal artery ultrasound.

## 2011-10-04 NOTE — Assessment & Plan Note (Signed)
Labile blood pressure, often in the 120 range. Some of the severely elevated pressures could be secondary to significant stress at home. No medication changes at this time.

## 2011-10-04 NOTE — Assessment & Plan Note (Addendum)
He has a 9 pound weight gain over the past several months, worsening lower extremity edema. He does not take Lasix on a consistent basis. We have suggested he take Lasix 40 mg twice a day with goal 5 pound weight loss. After this, decrease the Lasix to 40 mg daily with close monitoring of his weight. He does not need supplemental potassium given underlying renal dysfunction and hyperkalemia in the past.  Symptoms will be made worse by underlying mild to moderate aortic valve stenosis.

## 2011-10-04 NOTE — Assessment & Plan Note (Signed)
Maintaining normal sinus rhythm on today's visit with first degree AV block. We'll continue current medications.

## 2011-10-04 NOTE — Patient Instructions (Addendum)
You are doing well. Please start lasix 40 mg twice a day  Once your weight has decreased 5 lbs,  Then decrease the lasix to 40 mg once a day   Please call us if you have new issues that need to be addressed before your next appt.  Your physician wants you to follow-up in: 2 months.  You will receive a reminder letter in the mail two months in advance. If you don't receive a letter, please call our office to schedule the follow-up appointment.

## 2011-10-04 NOTE — Assessment & Plan Note (Signed)
Acceptable risk for upcoming right total knee replacement. We would recommend that IV fluids be minimized given underlying diastolic dysfunction. No further testing needed. We will arrange Lovenox bridge when he holds his warfarin once the surgical date has been established.

## 2011-10-04 NOTE — Assessment & Plan Note (Signed)
Kidney disease, followed by Dr. Cherylann Ratel.

## 2011-10-04 NOTE — Assessment & Plan Note (Signed)
Mild-to-moderate aortic valve stenosis  by echocardiogram in 2012 .

## 2011-10-04 NOTE — Assessment & Plan Note (Signed)
Edema is much worse today with 9 pound weight gain. We will increase diuretic as above.

## 2011-10-04 NOTE — Assessment & Plan Note (Signed)
We'll continue gemfibrozil and red yeast rice. He is not interested in a statin based on previous discussions.

## 2011-10-04 NOTE — Progress Notes (Signed)
Patient ID: Scott Norman, male    DOB: 1931-01-31, 78 y.o.   MRN: [id number]  HPI Comments: 78 yo with history of paroxysmal atrial fibrillation, CKD, and mild to moderate aortic stenosis, recent hospitalization for large cardioembolic stroke ([place] in 5/12 with a left MCA cardioembolic stroke),   treated with tPA, noted to be in atrial fibrillation  while in the hospital with residual profound expressive aphasia, now on coumadin,  with weight gain, worsening edema and abdominal swelling in 2012 , Severe hypertension, found to have renal artery stenosis, now status post stent placement. He presents for routine followup  He states that he has had worsening edema over the past several weeks. Left leg is always swollen secondary to previous fracture. Right leg is now swelling and left leg is significant. He has taken Lasix periodically, missing many days. He took Lasix 80 mg today. He does have 20 mg tabs at home and takes them occasionally, not on a regular basis. He did not take any Lasix yesterday. At baseline, he typically has mild edema, worse on the left, trace to 1+ bilaterally. Weight is up 9 pounds from his previous clinic visit.  Blood pressure has been labile, sometimes 120 systolic, other times more than 170. He presents with family today and they attribute the labile blood pressure 2 stress at home. He reports that his wife has caused significant stress for him.  He has been receiving Procrit shots at hematology and reports recently hemoglobin was greater than 10. He is trying to coordinate right knee replacement surgery with [facility].  EKG today shows sinus bradycardia with rate of 53 bpm, 1 st degree AV block, RBBB     Outpatient Encounter Prescriptions as of 10/04/2011  Medication Sig Dispense Refill  . amLODipine (NORVASC) 10 MG tablet Take 0.5 tablets (5 mg total) by mouth daily.      . cloNIDine (CATAPRES) 0.3 MG tablet Take 0.15-0.3 mg by mouth 4 (four) times  daily. Take 1 tablet early AM and late PM. Take 1/2 tablet at [facility] and 1/2 tablet Early afternoon.  90 tablet  6  . diazepam (VALIUM) 5 MG tablet Take 5 mg by mouth 2 (two) times daily.      Marland Kitchen docusate sodium (COLACE) 100 MG capsule Take 100 mg by mouth 3 (three) times daily as needed. For stool softner      . doxazosin (CARDURA) 2 MG tablet Take 4 mg by mouth at bedtime.       . enalapril (VASOTEC) 5 MG tablet Take 1 tablet (5 mg total) by mouth daily.  30 tablet  1  . gemfibrozil (LOPID) 600 MG tablet Take 600 mg by mouth 2 (two) times daily before a meal.        . glucose blood (BAYER CONTOUR NEXT TEST) test strip Use as instructed  100 each  12  . hydrALAZINE (APRESOLINE) 100 MG tablet Take 150 mg by mouth 4 (four) times daily.      Marland Kitchen HYDROcodone-acetaminophen (LORTAB) 10-500 MG per tablet Take 1 tablet by mouth every 6 (six) hours as needed.      . isosorbide mononitrate (IMDUR) 30 MG 24 hr tablet Take 120 mg by mouth 2 (two) times daily.      . Red Yeast Rice 600 MG TABS Take 1 tablet by mouth daily.       . Sod Citrate-Citric Acid (CYTRA-2) 500-334 MG/5ML SOLN Take by mouth.      . warfarin (COUMADIN) 5 MG  tablet Adjust dose for goal INR 2.0-3.0.  Take as directed by anticoagulation clinic  45 tablet  1  .  furosemide (LASIX) 20 MG tablet Take 1 tablet (20 mg total) by mouth daily as needed (for edema).  30 tablet  3     Review of Systems  Constitutional: Negative.   HENT: Negative.   Eyes: Negative.   Respiratory: Negative.   Cardiovascular: Positive for leg swelling.  Gastrointestinal: Negative.   Musculoskeletal: Positive for gait problem.  Skin: Negative.   Neurological: Negative.        Speech difficulty  Hematological: Negative.   Psychiatric/Behavioral: Negative.   All other systems reviewed and are negative.   BP 133/68  Pulse 53  Ht 6' (1.829 m)  Wt 233 lb (105.688 kg)  BMI 31.60 kg/m2  Physical Exam  Nursing note and vitals reviewed. Constitutional: He is  oriented to person, place, and time. He appears well-developed and well-nourished.  HENT:  Head: Normocephalic.  Nose: Nose normal.  Mouth/Throat: Oropharynx is clear and moist.  Eyes: Conjunctivae are normal. Pupils are equal, round, and reactive to light.  Neck: Normal range of motion. Neck supple. No JVD present. Carotid bruit is present.  Cardiovascular: Normal rate, S1 normal, S2 normal and intact distal pulses.  An irregularly irregular rhythm present.  Occasional extrasystoles are present. Exam reveals no gallop and no friction rub.   Murmur heard.  Crescendo systolic murmur is present with a grade of 2/6       1+ to 2+ pitting edema, Left greater than right  Pulmonary/Chest: Effort normal and breath sounds normal. No respiratory distress. He has no wheezes. He has no rales. He exhibits no tenderness.  Abdominal: Soft. Bowel sounds are normal. He exhibits no distension. There is no tenderness.  Musculoskeletal: Normal range of motion. He exhibits edema. He exhibits no tenderness.  Lymphadenopathy:    He has no cervical adenopathy.  Neurological: He is alert and oriented to person, place, and time. Coordination normal.  Skin: Skin is warm and dry. No rash noted. No erythema.  Psychiatric: He has a normal mood and affect. His behavior is normal. Judgment and thought content normal.           Assessment and Plan

## 2011-10-12 ENCOUNTER — Telehealth: Payer: Self-pay

## 2011-10-12 ENCOUNTER — Observation Stay: Payer: Self-pay | Admitting: Internal Medicine

## 2011-10-12 ENCOUNTER — Telehealth: Payer: Self-pay | Admitting: Internal Medicine

## 2011-10-12 ENCOUNTER — Telehealth: Payer: Self-pay | Admitting: Cardiovascular Disease

## 2011-10-12 LAB — CBC
MCH: 28.8 pg (ref 26.0–34.0)
MCHC: 33 g/dL (ref 32.0–36.0)
MCV: 88 fL (ref 80–100)
Platelet: 320 10*3/uL (ref 150–440)
RDW: 15.3 % — ABNORMAL HIGH (ref 11.5–14.5)

## 2011-10-12 LAB — CK TOTAL AND CKMB (NOT AT ARMC)
CK, Total: 39 U/L (ref 35–232)
CK-MB: 0.6 ng/mL (ref 0.5–3.6)

## 2011-10-12 LAB — BASIC METABOLIC PANEL
Anion Gap: 10 (ref 7–16)
BUN: 53 mg/dL — ABNORMAL HIGH (ref 7–18)
Creatinine: 2.87 mg/dL — ABNORMAL HIGH (ref 0.60–1.30)
EGFR (African American): 23 — ABNORMAL LOW
EGFR (Non-African Amer.): 20 — ABNORMAL LOW
Glucose: 126 mg/dL — ABNORMAL HIGH (ref 65–99)
Osmolality: 297 (ref 275–301)
Potassium: 3.4 mmol/L — ABNORMAL LOW (ref 3.5–5.1)
Sodium: 141 mmol/L (ref 136–145)

## 2011-10-12 LAB — PROTIME-INR
INR: 5.1
Prothrombin Time: 46.4 secs — ABNORMAL HIGH (ref 11.5–14.7)

## 2011-10-12 NOTE — Telephone Encounter (Signed)
Patients daughter notified, the surgery is not scheduled yet, but she will let us know when it is.

## 2011-10-12 NOTE — Telephone Encounter (Signed)
If he has an appt may 24th,  When is the knee surgery scheduled? We can use that appt

## 2011-10-12 NOTE — Telephone Encounter (Signed)
Spoke to patient's care giver [name] and told her Dr.Gollan seeing patient's this afternoon.Stated she is worried about patient.Advised to take patient to ER.Stated they would take patient to ER in [place].

## 2011-10-12 NOTE — Telephone Encounter (Signed)
Scott Norman daughter called back to report bp is 99/44 and pulse=49 at his home.

## 2011-10-12 NOTE — Telephone Encounter (Signed)
He has numerous medications that he is taking. Would see what he has been taking recently. Hold imdur if still taking Hold hydralazine until BP comes back up. Would make appt in clinic Tjay Velazquez or arida first available

## 2011-10-12 NOTE — Telephone Encounter (Signed)
Patient's daughter [name] called stated father's b/p has been low for the last 2 days.States b/p 81/32,102/42,96/43,97/40, pulse in the 70's.States he held amlodipine this morning.States he feels ok,just unsteady on feet.Fowarded to [facility] for advise.

## 2011-10-12 NOTE — Telephone Encounter (Signed)
Patient's care giver [name] called stated she is worried about patient.States for the past 3 days is sleeping too much.States his b/p at 1:00 pm 99\44 p-41,2:13 pm 116/41 p-41.States patient has had a stroke in the past and is concerned.Fowarded to [facility] for advice.

## 2011-10-12 NOTE — Telephone Encounter (Signed)
Pt BP is running low and have done everything they know to do to raise his pressures.  81/32 102/42, 96/43, 97/40 after walking, was not stable on feet

## 2011-10-12 NOTE — Telephone Encounter (Signed)
[phone]  Scott Stanley  [name] daughter called needs surgical clearance for [facility]   For knee replacement  Pt has already seen dr Mariah Milling last thursday Their email address [email or web address] Do you want a surgcial clearance appointment or can you do a letter for this Pt has lab this Friday and an appointment with dr Darrick Huntsman 5/24

## 2011-10-12 NOTE — Telephone Encounter (Signed)
Patient's caregiver [name] was worried about patient and was told to take patient to ER.

## 2011-10-13 ENCOUNTER — Telehealth: Payer: Self-pay | Admitting: Internal Medicine

## 2011-10-13 DIAGNOSIS — I951 Orthostatic hypotension: Secondary | ICD-10-CM

## 2011-10-13 DIAGNOSIS — R42 Dizziness and giddiness: Secondary | ICD-10-CM

## 2011-10-13 LAB — BASIC METABOLIC PANEL
Calcium, Total: 8.6 mg/dL (ref 8.5–10.1)
Chloride: 105 mmol/L (ref 98–107)
Co2: 27 mmol/L (ref 21–32)
Creatinine: 2.6 mg/dL — ABNORMAL HIGH (ref 0.60–1.30)
Glucose: 125 mg/dL — ABNORMAL HIGH (ref 65–99)
Osmolality: 297 (ref 275–301)
Potassium: 3.5 mmol/L (ref 3.5–5.1)

## 2011-10-13 LAB — PROTIME-INR
INR: 3.7
Prothrombin Time: 36.4 secs — ABNORMAL HIGH (ref 11.5–14.7)

## 2011-10-13 NOTE — Telephone Encounter (Signed)
I spoke with patients daughter and she stated patient is doing much better since coming home from the hospital.  I asked her to make sure he brings his discharge summary and all of his current medications to his appointment.

## 2011-10-13 NOTE — Telephone Encounter (Signed)
Hospital discharge [facility] 10/13/11 made follow up appointment for 10/22/11

## 2011-10-14 ENCOUNTER — Telehealth: Payer: Self-pay | Admitting: Cardiovascular Disease

## 2011-10-14 NOTE — Telephone Encounter (Signed)
Discussed with Dr. Graciela Husbands who gave order to give 1 dose of 20 mg lasix PO and keep appt for tomm. I gave this info to patients daughter who verb. Understanding.

## 2011-10-14 NOTE — Telephone Encounter (Signed)
Pt was told to stop his lasix when he was d/c from the hospital. Pt legs are really swollen but his weight is not up. Pt aware Dr Mariah Milling is not in the office this pm

## 2011-10-14 NOTE — Telephone Encounter (Signed)
Returned call to dtr. She says patient's nurse called for patient. Patient's lasix was stopped at d/c. Has appt with Dr. Mariah Milling tomm pm. Concerned b/c of LE edema. Denies worsening sob. Weight this am was 215 pounds. This is 4 pounds below optimal weight of 219 pounds.  BP this am was 125/45.  Has 20 mg and 40 mg tabs of lasix.  Will discuss with Dr. Graciela Husbands.

## 2011-10-14 NOTE — Telephone Encounter (Signed)
LMTCB

## 2011-10-15 ENCOUNTER — Ambulatory Visit (INDEPENDENT_AMBULATORY_CARE_PROVIDER_SITE_OTHER): Payer: Medicare Other | Admitting: Cardiovascular Disease

## 2011-10-15 ENCOUNTER — Encounter: Payer: Self-pay | Admitting: Cardiovascular Disease

## 2011-10-15 ENCOUNTER — Other Ambulatory Visit: Payer: Medicare Other

## 2011-10-15 ENCOUNTER — Telehealth: Payer: Self-pay | Admitting: Internal Medicine

## 2011-10-15 VITALS — BP 120/50 | HR 64 | Ht 72.0 in | Wt 217.0 lb

## 2011-10-15 DIAGNOSIS — I10 Essential (primary) hypertension: Secondary | ICD-10-CM

## 2011-10-15 DIAGNOSIS — R609 Edema, unspecified: Secondary | ICD-10-CM

## 2011-10-15 DIAGNOSIS — N189 Chronic kidney disease, unspecified: Secondary | ICD-10-CM

## 2011-10-15 DIAGNOSIS — I4891 Unspecified atrial fibrillation: Secondary | ICD-10-CM

## 2011-10-15 NOTE — Telephone Encounter (Signed)
Scott Norman stated that he was not going to do his labs today.  He stated that he was in hospital and they did labs on Tuesday and Wednesday and that he was going to cancer center today and they will be doing labs again today and he did not want to get stuck again

## 2011-10-15 NOTE — Patient Instructions (Addendum)
You are doing well. No medication changes were made.  Goal weight is 220 to 225 lbs Ok to restart  Doxazosin 4mg  when the blood pressure comes up Then wean clonidine as tolerated.   Please call us if you have new issues that need to be addressed before your next appt.  Your physician wants you to follow-up in: 3 months.  You will receive a reminder letter in the mail two months in advance. If you don't receive a letter, please call our office to schedule the follow-up appointment.

## 2011-10-15 NOTE — Assessment & Plan Note (Addendum)
Recent decrease in his blood pressure likely secondary to hypovolemia. We have suggested his goal weight should be approximately 220 pounds to 225 pounds. You only take Lasix accordingly depending on his weight to avoid dehydration.  Given his bradycardia, and suggested they try to slowly wean his clonidine if blood pressure permits.

## 2011-10-15 NOTE — Assessment & Plan Note (Signed)
Currently maintaining normal sinus rhythm. He does have bradycardia

## 2011-10-15 NOTE — Progress Notes (Signed)
Patient ID: Scott Norman, male    DOB: February 25, 1931, 78 y.o.   MRN: [id number]  HPI Comments: 78 yo with history of paroxysmal atrial fibrillation, CKD, and mild to moderate aortic stenosis, recent hospitalization for large cardioembolic stroke ([place] in 5/12 with a left MCA cardioembolic stroke),   treated with tPA, noted to be in atrial fibrillation  while in the hospital with residual profound expressive aphasia, now on coumadin,  with weight gain, worsening edema and abdominal swelling in 2012 , Severe hypertension, found to have renal artery stenosis, now status post stent placement. He presents for routine followup  On his last clinic visit, his weight was 228 pounds and he had significant edema. We suggested he start Lasix twice a day with goal weight loss of 5 pounds. Family member gave him some 80 mg Lasix doses and he had a 10 pound weight loss rapidly, hypotension and was kept in the hospital overnight for acute on chronic renal dysfunction secondary to overdiuresis. Lasix was held for several days and he now presents feeling much better. He did take Lasix again yesterday for mild lower sternal edema at the end of the day. Current weight remains low at 214 pounds at home. He is feeling somewhat sleepy . Notes from home indicate low heart rate . He has not restarted doxazosin. Family has been cutting back on his clonidine . They have not been taking enalapril .  EKG today shows normal sinus rhythm with rate 64  bpm, 1 st degree AV block, RBBB    Outpatient Encounter Prescriptions as of 10/15/2011  Medication Sig Dispense Refill  . amLODipine (NORVASC) 5 MG tablet Take 5 mg by mouth daily.      . cloNIDine (CATAPRES) 0.3 MG tablet Takes 1/2 tablet at 5:00 am and 1/2 at lunch and 1/2 at bedtime.  90 tablet  6  . diazepam (VALIUM) 5 MG tablet Takes 1/2 tablet four times a daily.      Marland Kitchen docusate sodium (COLACE) 100 MG capsule Take 100 mg by mouth 3 (three) times daily as needed. For stool  softner      . doxazosin (CARDURA) 2 MG tablet Take 4 mg by mouth at bedtime.       . furosemide (LASIX) 20 MG tablet Take 20 mg by mouth daily.      Marland Kitchen gemfibrozil (LOPID) 600 MG tablet Take 600 mg by mouth 2 (two) times daily before a meal.        . glucose blood (BAYER CONTOUR NEXT TEST) test strip Use as instructed  100 each  12  . hydrALAZINE (APRESOLINE) 100 MG tablet Take 150 mg by mouth 4 (four) times daily.      Marland Kitchen HYDROcodone-acetaminophen (LORTAB) 10-500 MG per tablet Take 1 tablet by mouth every 6 (six) hours as needed.      . isosorbide mononitrate (IMDUR) 60 MG 24 hr tablet Takes 2 tablets twice a day.      . Red Yeast Rice 600 MG TABS Take 1 tablet by mouth daily.       . Sod Citrate-Citric Acid (CYTRA-2) 500-334 MG/5ML SOLN Take by mouth.      . warfarin (COUMADIN) 5 MG tablet Adjust dose for goal INR 2.0-3.0.  Take as directed by anticoagulation clinic  45 tablet  1  . enalapril (VASOTEC) 5 MG tablet Take 1 tablet (5 mg total) by mouth daily.  NOT TAKING  30 tablet  1     Review of Systems  Constitutional: Negative.   HENT: Negative.   Eyes: Negative.   Respiratory: Negative.   Gastrointestinal: Negative.   Musculoskeletal: Positive for gait problem.  Skin: Negative.   Neurological: Negative.        Speech difficulty  Hematological: Negative.   Psychiatric/Behavioral: Negative.   All other systems reviewed and are negative.   BP 120/50  Pulse 64  Ht 6' (1.829 m)  Wt 217 lb (98.431 kg)  BMI 29.43 kg/m2  Physical Exam  Nursing note and vitals reviewed. Constitutional: He is oriented to person, place, and time. He appears well-developed and well-nourished.  HENT:  Head: Normocephalic.  Nose: Nose normal.  Mouth/Throat: Oropharynx is clear and moist.  Eyes: Conjunctivae are normal. Pupils are equal, round, and reactive to light.  Neck: Normal range of motion. Neck supple. No JVD present. Carotid bruit is present.  Cardiovascular: Normal rate, S1 normal, S2  normal and intact distal pulses.  An irregularly irregular rhythm present.  Occasional extrasystoles are present. Exam reveals no gallop and no friction rub.   Murmur heard.  Crescendo systolic murmur is present with a grade of 2/6  Pulmonary/Chest: Effort normal and breath sounds normal. No respiratory distress. He has no wheezes. He has no rales. He exhibits no tenderness.  Abdominal: Soft. Bowel sounds are normal. He exhibits no distension. There is no tenderness.  Musculoskeletal: Normal range of motion. He exhibits no tenderness.  Lymphadenopathy:    He has no cervical adenopathy.  Neurological: He is alert and oriented to person, place, and time. Coordination normal.  Skin: Skin is warm and dry. No rash noted. No erythema.  Psychiatric: He has a normal mood and affect. His behavior is normal. Judgment and thought content normal.           Assessment and Plan

## 2011-10-15 NOTE — Assessment & Plan Note (Signed)
Recent worsening kidney function in the setting of overdiuresis. Improve and with mild hydration.

## 2011-10-15 NOTE — Assessment & Plan Note (Signed)
No significant edema on today's visit. I suspect with weight of approximately 220-225 pounds, he may have mild edema which may be chronic.

## 2011-10-20 ENCOUNTER — Ambulatory Visit (INDEPENDENT_AMBULATORY_CARE_PROVIDER_SITE_OTHER): Payer: Medicare Other

## 2011-10-20 DIAGNOSIS — I639 Cerebral infarction, unspecified: Secondary | ICD-10-CM

## 2011-10-20 DIAGNOSIS — Z7901 Long term (current) use of anticoagulants: Secondary | ICD-10-CM

## 2011-10-20 DIAGNOSIS — I6350 Cerebral infarction due to unspecified occlusion or stenosis of unspecified cerebral artery: Secondary | ICD-10-CM

## 2011-10-20 DIAGNOSIS — I4891 Unspecified atrial fibrillation: Secondary | ICD-10-CM

## 2011-10-20 LAB — POCT INR: INR: 3

## 2011-10-21 ENCOUNTER — Telehealth: Payer: Self-pay | Admitting: Internal Medicine

## 2011-10-21 NOTE — Telephone Encounter (Signed)
Will faxing order for  Speech therapy for 9 weeks She just wanted you to be on the look out for it

## 2011-10-22 ENCOUNTER — Ambulatory Visit (INDEPENDENT_AMBULATORY_CARE_PROVIDER_SITE_OTHER): Payer: Medicare Other | Admitting: Internal Medicine

## 2011-10-22 ENCOUNTER — Ambulatory Visit: Payer: Medicare Other | Admitting: Internal Medicine

## 2011-10-22 ENCOUNTER — Encounter: Payer: Self-pay | Admitting: Internal Medicine

## 2011-10-22 VITALS — BP 163/56 | HR 62 | Temp 98.1°F | Resp 18 | Wt 224.5 lb

## 2011-10-22 DIAGNOSIS — I509 Heart failure, unspecified: Secondary | ICD-10-CM

## 2011-10-22 DIAGNOSIS — I5033 Acute on chronic diastolic (congestive) heart failure: Secondary | ICD-10-CM

## 2011-10-22 DIAGNOSIS — Z01818 Encounter for other preprocedural examination: Secondary | ICD-10-CM

## 2011-10-22 NOTE — Progress Notes (Signed)
Patient ID: Scott Norman, male   DOB: 1931-02-20, 78 y.o.   MRN: [id number] Patient Active Problem List  Diagnoses  . Atrial fibrillation  . Aortic stenosis  . Hyperlipidemia  . Hypertension  . Bradycardia  . CKD (chronic kidney disease)  . CKD (chronic kidney disease)  . Encounter for long-term (current) use of anticoagulants  . CVA (cerebrovascular accident)  . Edema  . Atrial flutter  . Type II diabetes mellitus with nephropathy  . Right renal artery stenosis  . Valvular cardiomyopathy  . Screening for osteoporosis  . Urinary tract infection due to Enterococcus  . Renal artery stenosis, native, bilateral  . Bruise of toe  . Knee pain, right  . Acute on chronic diastolic CHF (congestive heart failure)  . Preoperative cardiovascular examination  . Preoperative evaluation to rule out surgical contraindication    Subjective:  CC:   Chief Complaint  Patient presents with  . Follow-up    Post Hospital    HPI:   Scott Norman a 78 y.o. male who presents for follow up and medical clearance for anticipated knee replacement  In June. He was admitted to [facility] with dehydration and hypovolemia on May secondary to overdiuresis accompanied by acute on chronic renal failure, bradycardia e and supratherapuetic INR.  Scott Norman was kept overnight .  Dr. Mariah Milling stopped the excessive iv fluids and patient was discharged home following day. He has followed up with Dr. Orlie Dakin and his coumadin was resumed, as was his diuretic in prn dosng to maintian wt between 220 and 225. Marland Kitchen  ACE inhibitor was not resumed, and amlodipine was decreased to 5 mg daily . His last hgb 10.[facility] by [facility] so no Aranesp was given by Dr. Orlie Dakin yesterday.  Knee surgery anticipated for this summer and patient is requesting medical clearance. He is using 4 vicodin daily for pain control, which is effective in controlling his pain only when sitting. He is less anxious today because his wife has stopped harassing  him since he told her he wanted a divorce.     Past Medical History  Diagnosis Date  . Diabetes mellitus   . Hypertension   . Degenerative disc disease, lumbar   . Gout   . Atrial fibrillation   . PVD (peripheral vascular disease)   . Aortic stenosis   . Hyperlipidemia   . Chronic kidney disease (CKD), stage III (moderate)   . H/O colonoscopy June 2007    normal  . Sleep apnea   . Screening for osteoporosis Feb 2012    T -1.3 hip  . Stroke 2012    Rmbloic, with expressive aphasia  . Renal artery stenosis, native, bilateral     s/p right RA stent 2012, pending left     Past Surgical History  Procedure Date  . Shoulder surgery   . Lumbar spine surgery          The following portions of the patient's history were reviewed and updated as appropriate: Allergies, current medications, and problem list.    Review of Systems:   12 Pt  review of systems was negative except those addressed in the HPI,     History   Social History  . Marital Status: Married    Spouse Name: N/A    Number of Children: N/A  . Years of Education: N/A   Occupational History  . retired    Social History Main Topics  . Smoking status: Never Smoker   . Smokeless tobacco:  Never Used  . Alcohol Use: 1.2 oz/week    2 Glasses of wine per week     rarely  . Drug Use: No  . Sexually Active: No   Other Topics Concern  . Not on file   Social History Narrative   Lives with second wife,  often left alone for hours on end.  ,  Meals delayed by wife's absence from hone until 7 or 8 PM.  Daughter [name] actively involved , Ambulates with walker.     Objective:  BP 163/56  Pulse 62  Temp(Src) 98.1 F (36.7 C) (Oral)  Resp 18  Wt 224 lb 8 oz (101.833 kg)  SpO2 99%  General appearance: alert, cooperative and appears stated age Ears: normal TM's and external ear canals both ears Throat: lips, mucosa, and tongue normal; teeth and gums normal Neck: no adenopathy, no carotid bruit,  supple, symmetrical, trachea midline and thyroid not enlarged, symmetric, no tenderness/mass/nodules Back: symmetric, no curvature. ROM normal. No CVA tenderness. Lungs: clear to auscultation bilaterally Heart: regular rate and rhythm, S1, S2 normal, no murmur, click, rub or gallop Abdomen: soft, non-tender; bowel sounds normal; no masses,  no organomegaly Pulses: 2+ and symmetric Skin: Skin color, texture, turgor normal. No rashes or lesions Lymph nodes: Cervical, supraclavicular, and axillary nodes normal.  Assessment and Plan:  Acute on chronic diastolic CHF (congestive heart failure) Managed with daily titration of furosemide and  Antihypertensives to maintain normotension, which is challenging.  Fluid status is very labile as he also has moderate aortic stenosis and renal artery stenosis s/p recent intervention .   Preoperative evaluation to rule out surgical contraindication Although he has a complicated medical history secondary to aortic stenosis with diastolic dysfunction,  Chronic kidney disease, atrial fibrillation with recent embolic CVA now on coumadin, he is an acceptable risk for planned total knee replacement.  He will need Ovenox for bridging and close montoring of fluid status and renal function, Have recommended patent start doing some knee extensions at home to strengthen his quads, but I suspect his orthopedist  will order home PT in anticipation    Updated Medication List Outpatient Encounter Prescriptions as of 10/22/2011  Medication Sig Dispense Refill  . amLODipine (NORVASC) 5 MG tablet Take 5 mg by mouth daily.      . cloNIDine (CATAPRES) 0.3 MG tablet Takes 1/2 tablet at 5:00 am and 1/2 at lunch and 1/2 at bedtime.  90 tablet  6  . diazepam (VALIUM) 5 MG tablet Takes 1/2 tablet four times a daily.      Marland Kitchen docusate sodium (COLACE) 100 MG capsule Take 100 mg by mouth 3 (three) times daily as needed. For stool softner      . doxazosin (CARDURA) 2 MG tablet Take 4 mg by  mouth at bedtime.       . enalapril (VASOTEC) 5 MG tablet Take 1 tablet (5 mg total) by mouth daily.  30 tablet  1  . furosemide (LASIX) 20 MG tablet Take 20 mg by mouth daily as needed.       Marland Kitchen gemfibrozil (LOPID) 600 MG tablet Take 600 mg by mouth 2 (two) times daily before a meal.        . glucose blood (BAYER CONTOUR NEXT TEST) test strip Use as instructed  100 each  12  . hydrALAZINE (APRESOLINE) 100 MG tablet Take 150 mg by mouth 4 (four) times daily.      Marland Kitchen HYDROcodone-acetaminophen (LORTAB) 10-500 MG per tablet Take 1  tablet by mouth every 6 (six) hours as needed.      . isosorbide mononitrate (IMDUR) 60 MG 24 hr tablet Takes 2 tablets twice a day.      . Red Yeast Rice 600 MG TABS Take 1 tablet by mouth daily.       . Sod Citrate-Citric Acid (CYTRA-2) 500-334 MG/5ML SOLN Take by mouth.      . warfarin (COUMADIN) 5 MG tablet Adjust dose for goal INR 2.0-3.0.  Take as directed by anticoagulation clinic  45 tablet  1     No orders of the defined types were placed in this encounter.    No Follow-up on file.

## 2011-10-25 ENCOUNTER — Encounter: Payer: Self-pay | Admitting: Internal Medicine

## 2011-10-25 DIAGNOSIS — Z01818 Encounter for other preprocedural examination: Secondary | ICD-10-CM | POA: Insufficient documentation

## 2011-10-25 NOTE — Assessment & Plan Note (Addendum)
Managed with daily titration of furosemide and  Antihypertensives to maintain normotension, which is challenging.  Fluid status is very labile as he also has moderate aortic stenosis and renal artery stenosis s/p recent intervention .

## 2011-10-25 NOTE — Assessment & Plan Note (Signed)
Although he has a complicated medical history secondary to aortic stenosis with diastolic dysfunction,  Recent embolic CVA now on coumadin, he is an acceptable risk for planned total knee replacement.  He will need Ovenox for bridging and close montoring of fluid status and renal function, Have recommended patent start doing some knee extensions at home to strengthen his quads, but I suspect his orthopedist  will order home PT in anticipation

## 2011-10-28 ENCOUNTER — Other Ambulatory Visit: Payer: Self-pay

## 2011-10-28 ENCOUNTER — Telehealth: Payer: Self-pay | Admitting: Internal Medicine

## 2011-10-28 MED ORDER — HYDRALAZINE HCL 100 MG PO TABS: 150.0000 mg | ORAL_TABLET | Freq: Four times a day (QID) | ORAL | Status: DC

## 2011-10-28 NOTE — Telephone Encounter (Signed)
Refill sent for hydralazine hcl 100 mg take 1 and 1/2 tablet four times a day.

## 2011-10-28 NOTE — Telephone Encounter (Signed)
[name] mr Pourciau caregiver came in today stated that when they went to [facility] today his hemglobian was 10.1 but they still won't do surgery because now they are stating it needs to be 12.  Mr Catherman has been going to dr feginan @ [facility] and the last 2 times he has went there they did lab and would not give him a shot epoetin because his hemoglobian was 10.1  If it needs to be 12 what are they supposed to do now.  Dr Thurston Hole stated that he thought medicare would not pay after it was 10

## 2011-10-28 NOTE — Telephone Encounter (Signed)
If he is seeing Dr. Orlie Dakin for treatment of his anemia, he needs to go back and discuss this with  Dr. Orlie Dakin.  He may want to put him on iron, but I do not think it will help to put me in the middle of this.

## 2011-10-28 NOTE — Telephone Encounter (Signed)
I called to get notes faxed to [place].  As soon as they get out of transcription they will fax them to [place]

## 2011-10-29 ENCOUNTER — Telehealth: Payer: Self-pay | Admitting: *Deleted

## 2011-10-29 MED ORDER — OXYCODONE-ACETAMINOPHEN 5-325 MG PO TABS: 1.0000 | ORAL_TABLET | Freq: Four times a day (QID) | ORAL | Status: DC | PRN

## 2011-10-29 NOTE — Telephone Encounter (Signed)
Daughter says that patient is not going to be having his knee replacement until July or August. She is asking if he can get something stronger to help him with his pain. Please advise.

## 2011-10-29 NOTE — Telephone Encounter (Signed)
Left detailed message with Patient's daughter

## 2011-10-29 NOTE — Telephone Encounter (Signed)
He can come by on Monday to pick up a percocet rx, he can take 2 hydrocodone for now as needed but just for the weekend.  The percocet is much much stronger.

## 2011-10-30 ENCOUNTER — Ambulatory Visit: Payer: Self-pay | Admitting: Oncology

## 2011-11-01 ENCOUNTER — Other Ambulatory Visit: Payer: Self-pay | Admitting: Internal Medicine

## 2011-11-01 MED ORDER — OXYCODONE-ACETAMINOPHEN 5-325 MG PO TABS: 1.0000 | ORAL_TABLET | Freq: Four times a day (QID) | ORAL | Status: AC | PRN

## 2011-11-01 NOTE — Telephone Encounter (Signed)
Daughter notified 

## 2011-11-03 ENCOUNTER — Ambulatory Visit (INDEPENDENT_AMBULATORY_CARE_PROVIDER_SITE_OTHER): Payer: Medicare Other

## 2011-11-03 DIAGNOSIS — Z7901 Long term (current) use of anticoagulants: Secondary | ICD-10-CM

## 2011-11-03 DIAGNOSIS — I4891 Unspecified atrial fibrillation: Secondary | ICD-10-CM

## 2011-11-03 DIAGNOSIS — I6350 Cerebral infarction due to unspecified occlusion or stenosis of unspecified cerebral artery: Secondary | ICD-10-CM

## 2011-11-03 DIAGNOSIS — I639 Cerebral infarction, unspecified: Secondary | ICD-10-CM

## 2011-11-03 LAB — POCT INR: INR: 3.9

## 2011-11-06 ENCOUNTER — Other Ambulatory Visit: Payer: Self-pay | Admitting: Cardiovascular Disease

## 2011-11-12 LAB — CANCER CENTER HEMOGLOBIN: HGB: 9.1 g/dL — ABNORMAL LOW (ref 13.0–18.0)

## 2011-11-16 ENCOUNTER — Telehealth: Payer: Self-pay | Admitting: Cardiovascular Disease

## 2011-11-16 NOTE — Telephone Encounter (Signed)
Pt daughter called stating that pt BP has been dropping over time today. Pt is dizzy and blurred vision. BS was 149. Only had breakfast of a pancake with coffee, some water, and trying to get him to drink some gatorade now. BP meds were around 6 am this morning.

## 2011-11-16 NOTE — Telephone Encounter (Signed)
Pt dtr says pt has been dizzy, c/o blurred vision.  BP=127/51, 119/43. Wt=213 pounds.  She tells me he has not been eating/drinking as he should.  I had her hold while I discussed with Dr. Mariah Milling.    Dr. Mariah Milling suggests holding lasix and push fluids, that pt may be too dry. If pt no better by tomm we may need to check blood work.  dtr informed.  Understanding verb. Pt already has appt tomm for coumadin and will keep appt.

## 2011-11-17 ENCOUNTER — Ambulatory Visit (INDEPENDENT_AMBULATORY_CARE_PROVIDER_SITE_OTHER): Payer: Medicare Other

## 2011-11-17 DIAGNOSIS — I6350 Cerebral infarction due to unspecified occlusion or stenosis of unspecified cerebral artery: Secondary | ICD-10-CM

## 2011-11-17 DIAGNOSIS — I639 Cerebral infarction, unspecified: Secondary | ICD-10-CM

## 2011-11-17 DIAGNOSIS — Z7901 Long term (current) use of anticoagulants: Secondary | ICD-10-CM

## 2011-11-17 DIAGNOSIS — I4891 Unspecified atrial fibrillation: Secondary | ICD-10-CM

## 2011-11-18 ENCOUNTER — Telehealth: Payer: Self-pay | Admitting: *Deleted

## 2011-11-18 ENCOUNTER — Telehealth: Payer: Self-pay

## 2011-11-18 NOTE — Telephone Encounter (Signed)
Returned call to pt's daughter reguarding upcoming knee surgery on 12/13/11 by Dr Thurston Hole. Per Dr Mariah Milling ok to hold Coumadin 5 days prior to surgery, Lovenox 100mg  SQ once daily d/t decr renal fx on last labs per Dr Mariah Milling per phone note from 06/30/11.  Advised pt's daughter we will type out instructions for pt at next Coumadin Clinic check scheduled for 12/01/11.  Per Dr Mariah Milling:  Acceptable risk for surgery.  Would do lovenox bridge at renal dosing which is 100 mg SQ once a day  Stop warfarin 5 days before surgery  Start lovenox three days before surgery  No lovenox morning of surgery  Start lovenox back after surgery, day after procedure (needs TED hose)  5 days after surgery, restart warfarin day after surgery

## 2011-11-18 NOTE — Telephone Encounter (Signed)
Pt wife called and wanted to know if she should schedule appointment with Dr. Mariah Milling to be seen before pt has right knee surgery on December 13, 2011 with Dr. Thurston Hole ? Mentioned that they were told to contact us when they have a definite date to see if he needs to be seen.

## 2011-11-18 NOTE — Telephone Encounter (Signed)
Pt's daughter returned call, pt needs to be bridged with Lovenox for upcoming knee surgery with Dr Thurston Hole on 12/13/11.  Pt has f/u appt in clinic for 12/01/11.  Will bridge pt at that visit, pt's daughter aware.

## 2011-11-24 ENCOUNTER — Ambulatory Visit: Payer: Medicare Other | Admitting: Internal Medicine

## 2011-11-26 LAB — CANCER CENTER HEMOGLOBIN: HGB: 9.3 g/dL — ABNORMAL LOW (ref 13.0–18.0)

## 2011-11-29 ENCOUNTER — Ambulatory Visit: Payer: Self-pay | Admitting: Oncology

## 2011-11-30 ENCOUNTER — Other Ambulatory Visit: Payer: Self-pay | Admitting: Physician Assistant

## 2011-11-30 ENCOUNTER — Encounter: Payer: Self-pay | Admitting: Physician Assistant

## 2011-11-30 NOTE — H&P (Signed)
Scott Norman is an 78 y.o. male.   Chief Complaint: right knee DJD HPI: Scott Norman is seen for follow up evaluation for significant persistent bilateral knee pain, left worse than right.  We last saw him on July 13, 2011 for possible knee replacement, despite his extremely significant complicated medical history.  At that time his blood pressure was not well controlled and he had not been cleared for surgery by his cardiologist, his nephrologist, his neurologist or his primary care physician.  Since we last saw him he has been cleared by his physicians, however, he remains quite anemic with his last hemoglobin level of 10.1.  Dr. Pearlean Brownie of neurosurgery felt that he did have a risk of a stroke at the time of knee replacement surgery if he became hypotensive and Dr. Mariah Milling, his cardiologist, felt that he did have a risk of cardiac event if he became hypotensive or hypertensive in the perioperative region.  Also felt that he needed to be bridged with Lovenox prior to and after surgery.  He has a significant history of renal artery stenosis and has had a stent placed, history of atrial fibrillation, aortic stenosis, hypertension, hyperlipidemia, renal disease and cardiac disease as well.  Dr. Cherylann Ratel of nephrology has also been involved in his care for his chronic renal insufficiency and was also very concerned about any potential hypotensive or hypertensive episodes at the time of surgery with his renal disease.  [name] continues to have significant increasing pain in his knees bilaterally, left worse than right.  This is made worse with activity and relieved by rest.    Past Medical History  Diagnosis Date  . Diabetes mellitus   . Hypertension   . Degenerative disc disease, lumbar     back surgery 2011  . Gout   . Atrial fibrillation   . PVD (peripheral vascular disease)   . Aortic stenosis   . Hyperlipidemia   . Chronic kidney disease (CKD), stage III (moderate)   . H/O colonoscopy June 2007     normal  . Sleep apnea   . Screening for osteoporosis Feb 2012    T -1.3 hip  . Stroke 2012    Rmbloic, with expressive aphasia  . Renal artery stenosis, native, bilateral     s/p right RA stent 2012, pending left     Past Surgical History  Procedure Date  . Shoulder surgery   . Lumbar spine surgery   . Renal artery stent 2013    Family History  Problem Relation Age of Onset  . Hypertension Other   . Diabetes Other   . Pancreatic cancer Mother    Social History:  reports that he has never smoked. He has never used smokeless tobacco. He reports that he drinks about 1.2 ounces of alcohol per week. He reports that he does not use illicit drugs.  Allergies:  Allergies  Allergen Reactions  . Ace Inhibitors     Renal insufficiency  . Beta Adrenergic Blockers     bradycardia  . Statins    Current Outpatient Prescriptions on File Prior to Visit  Medication Sig Dispense Refill  . amLODipine (NORVASC) 5 MG tablet Take 5 mg by mouth daily.      . diazepam (VALIUM) 5 MG tablet Takes 1/2 tablet four times a daily.      Marland Kitchen docusate sodium (COLACE) 100 MG capsule Take 100 mg by mouth 3 (three) times daily as needed. For stool softner      . doxazosin (  CARDURA) 2 MG tablet Take 4 mg by mouth at bedtime.       . enalapril (VASOTEC) 5 MG tablet Take 1 tablet (5 mg total) by mouth daily.  30 tablet  1  . furosemide (LASIX) 20 MG tablet Take 20 mg by mouth daily as needed.       Marland Kitchen gemfibrozil (LOPID) 600 MG tablet Take 600 mg by mouth 2 (two) times daily before a meal.        . glucose blood (BAYER CONTOUR NEXT TEST) test strip Use as instructed  100 each  12  . hydrALAZINE (APRESOLINE) 100 MG tablet Take 1.5 tablets (150 mg total) by mouth 4 (four) times daily.  135 tablet  6  . HYDROcodone-acetaminophen (LORTAB) 10-500 MG per tablet Take 1 tablet by mouth every 6 (six) hours as needed.      . isosorbide mononitrate (IMDUR) 60 MG 24 hr tablet Takes 2 tablets twice a day.      . Red  Yeast Rice 600 MG TABS Take 1 tablet by mouth daily.       . Sod Citrate-Citric Acid (CYTRA-2) 500-334 MG/5ML SOLN Take by mouth.      . warfarin (COUMADIN) 5 MG tablet Adjust dose for goal INR 2.0-3.0.  Take as directed by anticoagulation clinic  45 tablet  1  . cloNIDine (CATAPRES) 0.3 MG tablet Takes 1/2 tablet at 5:00 am and 1/2 at lunch and 1/2 at bedtime.  90 tablet  6  . cloNIDine (CATAPRES) 0.3 MG tablet TAKE 1 TABLET 3 TIMES DAILY  90 tablet  6    (Not in a hospital admission)  No results found for this or any previous visit (from the past 48 hour(s)). No results found.  Review of Systems  Constitutional: Negative.   HENT: Negative.   Eyes: Negative.   Respiratory: Negative.   Cardiovascular: Negative.   Gastrointestinal: Negative.   Genitourinary: Negative.   Musculoskeletal: Positive for joint pain.       Bilateral knee pain   Right worse than left  Skin: Negative.   Neurological: Positive for speech change.  Endo/Heme/Allergies: Bruises/bleeds easily.    Blood pressure 187/75, pulse 59, temperature 98.1 F (36.7 C), height 5\' 7"  (1.702 m), weight 99.791 kg (220 lb), SpO2 97.00%. Physical Exam  Constitutional: He is oriented to person, place, and time. He appears well-developed and well-nourished.  HENT:  Head: Normocephalic and atraumatic.  Mouth/Throat: Oropharynx is clear and moist.  Eyes: Conjunctivae and EOM are normal. Pupils are equal, round, and reactive to light.  Neck: Neck supple.  Cardiovascular: Normal rate.   Murmur heard. Respiratory: Breath sounds normal.  GI: Soft.  Genitourinary:       Not pertinent to current symptomatology therefore not examined.  Musculoskeletal:       Examination of both knees reveal pain medially and laterally.  Mild varus deformity.  1+ crepitation.  1+ synovitis.  Range of motion -5 to 120 degrees.  Knees are stable with normal patella tracking.  Gait evaluation shows a severe antalgic gait due to significant pain with  significant difficulty walking.  Neurologic exam: Distal motor and sensory examination is within normal limits.    Neurological: He is alert and oriented to person, place, and time.  Skin: Skin is warm and dry.  Psychiatric: He has a normal mood and affect. Judgment normal.    X-RAYS:  X-rays of both knees, standing AP, lateral, flexion and sunrise views, shows significant end stage DJD bilaterally with no joint  space remaining with periarticular spurring and varus deformity as well with moderate tibiofemoral subluxation and osteopenia.    Assessment Patient Active Problem List  Diagnosis  . Atrial fibrillation  . Aortic stenosis  . Hyperlipidemia  . Hypertension  . Bradycardia  . CKD (chronic kidney disease)  . CKD (chronic kidney disease)  . Encounter for long-term (current) use of anticoagulants  . CVA (cerebrovascular accident)  . Edema  . Atrial flutter  . Type II diabetes mellitus with nephropathy  . Right renal artery stenosis  . Valvular cardiomyopathy  . Screening for osteoporosis  . Urinary tract infection due to Enterococcus  . Renal artery stenosis, native, bilateral  . Bruise of toe  . Knee pain, right  . Acute on chronic diastolic CHF (congestive heart failure)  . Preoperative cardiovascular examination  . Preoperative evaluation to rule out surgical contraindication    Plan We have talked to him, his wife and his son in detail again today.  He has very serious co-morbidities for possible surgery.  I have told him that there is a significant chance that he will not survive the surgery if it is done with his severe cardiac disease, renal disease, as well as his anemia.  He still wants to pursue this possibility, despite these risks.  We will obtain blood work on him again today to see whether he is a candidate for anemia treatment, because his last hemoglobin was 10.1, which would not qualify him for any anemia treatment.  However, if a repeat hemoglobin would show  that he is below 10 he would qualify for this.  I have told him that I would not plan on any knee replacement surgery until his hemoglobin is closer to 12.  Again, the severe risks of this surgery in his health setting are major in nature and we have explained in detail to him and his family the seriousness of these risks.    Daysia Vandenboom J 11/30/2011, 2:37 PM

## 2011-12-01 ENCOUNTER — Ambulatory Visit (INDEPENDENT_AMBULATORY_CARE_PROVIDER_SITE_OTHER): Payer: Medicare Other | Admitting: *Deleted

## 2011-12-01 DIAGNOSIS — I4891 Unspecified atrial fibrillation: Secondary | ICD-10-CM

## 2011-12-01 DIAGNOSIS — Z7901 Long term (current) use of anticoagulants: Secondary | ICD-10-CM

## 2011-12-01 DIAGNOSIS — I6350 Cerebral infarction due to unspecified occlusion or stenosis of unspecified cerebral artery: Secondary | ICD-10-CM

## 2011-12-01 DIAGNOSIS — I639 Cerebral infarction, unspecified: Secondary | ICD-10-CM

## 2011-12-01 MED ORDER — ENOXAPARIN SODIUM 100 MG/ML ~~LOC~~ SOLN: 100.0000 mg | Freq: Every day | SUBCUTANEOUS | Status: DC

## 2011-12-01 NOTE — Patient Instructions (Signed)
Continue taking your coumadin until 12/07/11 which is the last dose. Then start taking Lovenox 100mg  subcutaneous once daily every 24 hrs  on 12/10/11 in the morning around 8am. Continue Lovenox injection daily until 12/12/11, which is the last injection until after surgery. Restart coumadin and Lovenox on 12/14/11 per Dr Mariah Milling.

## 2011-12-03 ENCOUNTER — Encounter (HOSPITAL_COMMUNITY): Payer: Self-pay | Admitting: Pharmacy Technician

## 2011-12-03 LAB — CANCER CENTER HEMOGLOBIN: HGB: 10.3 g/dL — ABNORMAL LOW (ref 13.0–18.0)

## 2011-12-03 LAB — IRON AND TIBC
Iron Bind.Cap.(Total): 277 ug/dL (ref 250–450)
Iron: 37 ug/dL — ABNORMAL LOW (ref 65–175)

## 2011-12-06 ENCOUNTER — Encounter (HOSPITAL_COMMUNITY)
Admission: RE | Admit: 2011-12-06 | Discharge: 2011-12-06 | Disposition: A | Discharge status: Home / Self Care | Payer: Medicare Other | Source: Ambulatory Visit | Attending: Orthopedic Surgery | Admitting: Orthopedic Surgery

## 2011-12-06 ENCOUNTER — Other Ambulatory Visit: Payer: Self-pay | Admitting: Orthopedic Surgery

## 2011-12-06 ENCOUNTER — Encounter (HOSPITAL_COMMUNITY): Payer: Self-pay | Admitting: Vascular Surgery

## 2011-12-06 ENCOUNTER — Encounter (HOSPITAL_COMMUNITY): Payer: Self-pay

## 2011-12-06 ENCOUNTER — Ambulatory Visit (HOSPITAL_COMMUNITY)
Admission: RE | Admit: 2011-12-06 | Discharge: 2011-12-06 | Disposition: A | Discharge status: Home / Self Care | Payer: Medicare Other | Source: Ambulatory Visit | Attending: Physician Assistant | Admitting: Physician Assistant

## 2011-12-06 DIAGNOSIS — I4891 Unspecified atrial fibrillation: Secondary | ICD-10-CM | POA: Insufficient documentation

## 2011-12-06 DIAGNOSIS — R9389 Abnormal findings on diagnostic imaging of other specified body structures: Secondary | ICD-10-CM

## 2011-12-06 DIAGNOSIS — Z01818 Encounter for other preprocedural examination: Secondary | ICD-10-CM | POA: Insufficient documentation

## 2011-12-06 DIAGNOSIS — E119 Type 2 diabetes mellitus without complications: Secondary | ICD-10-CM | POA: Insufficient documentation

## 2011-12-06 DIAGNOSIS — I10 Essential (primary) hypertension: Secondary | ICD-10-CM | POA: Insufficient documentation

## 2011-12-06 HISTORY — DX: Cardiac murmur, unspecified: R01.1

## 2011-12-06 HISTORY — DX: Anxiety disorder, unspecified: F41.9

## 2011-12-06 LAB — URINALYSIS, ROUTINE W REFLEX MICROSCOPIC
Glucose, UA: NEGATIVE mg/dL
Hgb urine dipstick: NEGATIVE
Ketones, ur: 15 mg/dL — AB
Nitrite: NEGATIVE
Protein, ur: >300 mg/dL — AB
Specific Gravity, Urine: 1.024 (ref 1.005–1.030)
Urobilinogen, UA: 0.2 mg/dL (ref 0.0–1.0)
pH: 5 (ref 5.0–8.0)

## 2011-12-06 LAB — COMPREHENSIVE METABOLIC PANEL
ALT: 5 U/L (ref 0–53)
AST: 10 U/L (ref 0–37)
Albumin: 3.1 g/dL — ABNORMAL LOW (ref 3.5–5.2)
Alkaline Phosphatase: 70 U/L (ref 39–117)
BUN: 29 mg/dL — ABNORMAL HIGH (ref 6–23)
CO2: 23 mEq/L (ref 19–32)
Calcium: 9.5 mg/dL (ref 8.4–10.5)
Chloride: 105 mEq/L (ref 96–112)
Creatinine, Ser: 1.88 mg/dL — ABNORMAL HIGH (ref 0.50–1.35)
GFR calc Af Amer: 38 mL/min — ABNORMAL LOW (ref >90)
GFR calc non Af Amer: 32 mL/min — ABNORMAL LOW (ref >90)
Glucose, Bld: 149 mg/dL — ABNORMAL HIGH (ref 70–99)
Potassium: 5.1 mEq/L (ref 3.5–5.1)
Sodium: 140 mEq/L (ref 135–145)
Total Bilirubin: 0.3 mg/dL (ref 0.3–1.2)
Total Protein: 6.7 g/dL (ref 6.0–8.3)

## 2011-12-06 LAB — DIFFERENTIAL
Basophils Absolute: 0 10*3/uL (ref 0.0–0.1)
Basophils Relative: 0 % (ref 0–1)
Eosinophils Absolute: 0.1 10*3/uL (ref 0.0–0.7)
Eosinophils Relative: 1 % (ref 0–5)
Lymphocytes Relative: 16 % (ref 12–46)
Lymphs Abs: 1.5 10*3/uL (ref 0.7–4.0)
Monocytes Absolute: 0.8 10*3/uL (ref 0.1–1.0)
Monocytes Relative: 8 % (ref 3–12)
Neutro Abs: 6.9 10*3/uL (ref 1.7–7.7)
Neutrophils Relative %: 74 % (ref 43–77)

## 2011-12-06 LAB — URINE MICROSCOPIC-ADD ON

## 2011-12-06 LAB — TYPE AND SCREEN
ABO/RH(D): A POS
Antibody Screen: NEGATIVE

## 2011-12-06 LAB — SURGICAL PCR SCREEN
MRSA, PCR: NEGATIVE
Staphylococcus aureus: NEGATIVE

## 2011-12-06 LAB — CBC
Hemoglobin: 11.2 g/dL — ABNORMAL LOW (ref 13.0–17.0)
MCH: 29 pg (ref 26.0–34.0)
Platelets: 348 10*3/uL (ref 150–400)
RBC: 3.86 MIL/uL — ABNORMAL LOW (ref 4.22–5.81)

## 2011-12-06 LAB — APTT: aPTT: 58 seconds — ABNORMAL HIGH (ref 24–37)

## 2011-12-06 NOTE — Pre-Procedure Instructions (Signed)
22 Scott Norman  12/06/2011   Your procedure is scheduled on:  Monday July 15  Report to [facility] at 7:15 AM.  Call this number if you have problems the morning of surgery: [phone]   Remember:   Do not eat food:After Midnight.  May have clear liquids:until Midnight .  Clear liquids include soda, tea, black coffee, apple or grape juice, broth.  Take these medicines the morning of surgery with A SIP OF WATER: Clonidine, Hydralazine, Amlodipine, Diazepam, Isosorbide, Lortab (Pain pill) if needed   Do not wear jewelry, make-up or nail polish.  Do not wear lotions, powders, or perfumes. You may wear deodorant.  Do not shave 48 hours prior to surgery. Men may shave face and neck.  Do not bring valuables to the hospital.  Contacts, dentures or bridgework may not be worn into surgery.  Leave suitcase in the car. After surgery it may be brought to your room.  For patients admitted to the hospital, checkout time is 11:00 AM the day of discharge.   Patients discharged the day of surgery will not be allowed to drive home.  Name and phone number of your driver: NA  Special Instructions: CHG Shower Use Special Wash: 1/2 bottle night before surgery and 1/2 bottle morning of surgery.   Please read over the following fact sheets that you were given: Pain Booklet, Coughing and Deep Breathing, Blood Transfusion Information, Total Joint Packet and Surgical Site Infection Prevention

## 2011-12-07 ENCOUNTER — Ambulatory Visit
Admission: RE | Admit: 2011-12-07 | Discharge: 2011-12-07 | Disposition: A | Discharge status: Home / Self Care | Payer: Medicare Other | Source: Ambulatory Visit | Attending: Orthopedic Surgery | Admitting: Orthopedic Surgery

## 2011-12-07 ENCOUNTER — Telehealth: Payer: Self-pay | Admitting: Pulmonary Disease

## 2011-12-07 DIAGNOSIS — R9389 Abnormal findings on diagnostic imaging of other specified body structures: Secondary | ICD-10-CM

## 2011-12-07 NOTE — Telephone Encounter (Signed)
Made an appt w/ MW for tomorrow.  Message not needed.  Scott Norman

## 2011-12-07 NOTE — Consult Note (Addendum)
Anesthesia Chart Review:  Patient is a 78 year old male scheduled for a right TKR on 12/13/11 by Dr. Thurston Hole.  He has a significant past medical history.  This includes mild to moderate AS, afib, HTN, DM2, CKD Stage III, chronic diastolic CHF, LE edema, anemia, OSA, large cardioembolic left MCA CVA with expressive aphasia May 2012 treated with TPA, HLD, PVD, gout, renal artery stenosis s/p right stent, anxiety, prior throat, shoulder and lumbar surgeries.  His Neurologist is Dr. Pearlean Brownie, Nephrologist is Dr. Cherylann Ratel in [place], Dr. Mariah Milling is his Cardiologist, Dr. Duncan Dull is his PCP.  Clearance notes from Dr. Mariah Milling and Dr. Darrick Huntsman can be found in Epic.  Lovenox bridge was recommended while off Coumadin due to his history of afib with CVA.  He was also seen at [facility] for a pre-operative evaluation (see 10/19/11 note).  Dr. Garnett Farm last office note has been requested.  EKG from 10/15/11 showed SR with first degree AVB, right BBB, possible RVH.    Echo on 12/28/10 showed: - Left ventricle: The cavity size was normal. Wall thickness was increased in a pattern of moderate LVH. The estimated ejection fraction was 60%. Wall motion was normal; there were no regional wall motion abnormalities. - Aortic valve: There is calcification of the leaflets. There is mild/moderate AS. Mean gradient: 25mm Hg (S). Peak gradient: 42mm Hg (S). - Mitral valve: Calcified annulus. - Left atrium: The atrium was moderately dilated. - Right atrium: The atrium was mildly dilated.  Labs on 12/06/11 showed K 5.1, BUN/Cr 29/1.88, glucose 149, H/H 11.2/35.4.  PT/PTT are elevated, so they will need to be repeated pre-operatively.  T&S done.  According to labs from Dr. Garnett Farm office, patient's Cr was 1.91 on 10/28/11.  CXR on 12/06/11 showed: 1. The left hilum is abnormal, new since 2012. Chest CT (IV  contrast preferred) recommended to further characterize and exclude a hilar mass.  2. Mild streaky retrocardiac  opacity may reflect acute pneumonia or could be postobstructive in light of #1.   He had a CT scan of the chest today, 12/07/11, that showed: 1. The opacity questioned on recent chest x-ray corresponds to parenchymal opacity within the superior segment of the left lower lobe and the lingula most likely representing pneumonia. Follow-up chest x-ray is recommended to ensure clearing however.  2. Small bilateral pleural effusions.  3. Slightly prominent mediastinal lymph nodes. None appear  pathologically enlarged.  4. Coronary artery calcifications. Cardiomegaly.  Patient has an appointment to see Pulmonologist Dr. Sherene Sires on 12/08/11.  Will follow-up Pulmonology recommendations once available.    Shonna Chock, PA-C 12/07/11 1536  Addendum: 12/09/11 1150  Reviewed Dr. Thurston Hole office note.  Patient was diagnosed with aspiration pneumonia.  Recommendations included starting Augmentin, referral for dental evaluation, repeating  CXR in 10 days, and postponing elective surgery for now.

## 2011-12-08 ENCOUNTER — Ambulatory Visit (INDEPENDENT_AMBULATORY_CARE_PROVIDER_SITE_OTHER): Payer: Medicare Other | Admitting: Internal Medicine

## 2011-12-08 ENCOUNTER — Encounter: Payer: Self-pay | Admitting: Internal Medicine

## 2011-12-08 VITALS — BP 150/58 | HR 54 | Temp 98.5°F | Ht 72.0 in | Wt 215.6 lb

## 2011-12-08 DIAGNOSIS — I10 Essential (primary) hypertension: Secondary | ICD-10-CM

## 2011-12-08 DIAGNOSIS — J189 Pneumonia, unspecified organism: Secondary | ICD-10-CM

## 2011-12-08 LAB — URINE CULTURE

## 2011-12-08 MED ORDER — OLMESARTAN MEDOXOMIL 20 MG PO TABS: 20.0000 mg | ORAL_TABLET | Freq: Every day | ORAL | Status: DC

## 2011-12-08 MED ORDER — AMOXICILLIN-POT CLAVULANATE 875-125 MG PO TABS: 1.0000 | ORAL_TABLET | Freq: Two times a day (BID) | ORAL | Status: AC

## 2011-12-08 NOTE — Patient Instructions (Addendum)
Stop vasotec and start benicar 20 mg one daily instead  Augmentin 875 twice daily with big glass water, then yogurt for lunch and let your coumadin clinic know you're on it  Repeat cxr in 2 weeks to clear you for surgery and recheck your blood pressure, either her or with Dr Darrick Huntsman (sent her a copy)

## 2011-12-08 NOTE — Progress Notes (Signed)
  Subjective:    Patient ID: Scott Norman, male    DOB: 03-Jun-1930   MRN: [id number]  HPI  82 yowm never smoker with no respiratory symptoms s/p cva 09/2010 [facility] with rehab in [place] with course complicated by dysphagia but no pna and changed diet to nl Sep 2012.  12/08/2011 1st pulmonary eval cc new onset choking x sev weeks while on vasotec and preop cxr for knee surgery c/w LLL sup segment pna.  He has only sltly discolored sputum and denies cp, fever, chills or sob but he is extremely limited by his knee at this point  He does have poor dentition with no recent dental care.   Sleeping ok without nocturnal  or early am exacerbation  of respiratory  c/o's or need for noct saba. Also denies any obvious fluctuation of symptoms with weather or environmental changes or other aggravating or alleviating factors except as outlined above       Review of Systems  Constitutional: Negative for fever and unexpected weight change.  HENT: Positive for rhinorrhea and trouble swallowing. Negative for ear pain, nosebleeds, congestion, sore throat, sneezing, dental problem, postnasal drip and sinus pressure.   Eyes: Negative for redness and itching.  Respiratory: Positive for cough. Negative for chest tightness, shortness of breath and wheezing.   Cardiovascular: Positive for leg swelling. Negative for palpitations.  Gastrointestinal: Negative for nausea and vomiting.  Genitourinary: Negative for dysuria.  Musculoskeletal: Negative for joint swelling.  Skin: Negative for rash.  Neurological: Negative for headaches.  Hematological: Bruises/bleeds easily.  Psychiatric/Behavioral: Negative for dysphoric mood. The patient is not nervous/anxious.        Objective:   Physical Exam Wt 215 12/09/2011 W/c bound elderly wm with expressive aphasia and hoarseness HEENT: poor dentition with no obvious abscess, turbinates, and orophanx. Nl external ear canals without cough reflex   NECK :  without  JVD/Nodes/TM/ nl carotid upstrokes bilaterally   LUNGS: no acc muscle use, clear to A and P bilaterally without cough on insp or exp maneuvers   CV:  RRR  no s3 or murmur or increase in P2, no edema   ABD:  soft and nontender with nl excursion in the supine position. No bruits or organomegaly, bowel sounds nl  MS:  warm without deformities, calf tenderness, cyanosis or clubbing  SKIN: warm and dry without lesions    NEURO:  alert, approp, no deficits         Assessment & Plan:

## 2011-12-09 DIAGNOSIS — J189 Pneumonia, unspecified organism: Secondary | ICD-10-CM | POA: Insufficient documentation

## 2011-12-09 NOTE — Assessment & Plan Note (Addendum)
Classic pattern for asp pna and he has been choking with eating x sev weeks while on ACEI - ACEI use not typically recognized as a risk for aspiration but since he already has additional upper airway issues from cva rec  Trial off acei x sev weeks to see if swallowing returns to normal Augmentin x 10 days Dental eval  Regroup with cxr in 10 days but no elective surgery for now  Discussed in detail all the  indications, usual  risks and alternatives  relative to the benefits with patient who agrees to proceed with postponing surgery for now.  Dr Thurston Hole notified.

## 2011-12-09 NOTE — Progress Notes (Signed)
Noted that pt did see Dr. Sherene Sires yesterday, 12/08/11- no mention of surgical clearance. Chart given to A. Zelenak, PA, to f/u.//L. Taris Galindo,RN

## 2011-12-11 ENCOUNTER — Encounter: Payer: Self-pay | Admitting: Internal Medicine

## 2011-12-11 NOTE — Assessment & Plan Note (Signed)
ACE inhibitors are problematic in  pts with airway complaints because  even experienced pulmonologists can't always distinguish ace effects from copd/asthma.  By themselves they don't actually cause a problem, much like oxygen can't by itself start a fire, but they certainly serve as a powerful catalyst or enhancer for any "fire"  or inflammatory process in the upper airway, be it caused by an ET  tube or more commonly reflux (especially in the obese or pts with known GERD or who are on biphoshonates).    In the era of ARB near equivalency until we have a better handle on the reversibility of the airway problem, it just makes sense to avoid ACEI  entirely in the short run.  For now therefore stop vasotec and relplace with benicar

## 2011-12-13 ENCOUNTER — Ambulatory Visit (HOSPITAL_COMMUNITY): Admission: RE | Admit: 2011-12-13 | Payer: Medicare Other | Source: Ambulatory Visit | Admitting: Orthopedic Surgery

## 2011-12-13 ENCOUNTER — Encounter (HOSPITAL_COMMUNITY): Admission: RE | Payer: Self-pay | Source: Ambulatory Visit

## 2011-12-13 SURGERY — ARTHROPLASTY, KNEE, TOTAL
Anesthesia: General | Laterality: Right

## 2011-12-14 LAB — CANCER CENTER HEMOGLOBIN: HGB: 10.7 g/dL — ABNORMAL LOW (ref 13.0–18.0)

## 2011-12-15 ENCOUNTER — Ambulatory Visit (INDEPENDENT_AMBULATORY_CARE_PROVIDER_SITE_OTHER): Payer: Medicare Other

## 2011-12-15 DIAGNOSIS — Z7901 Long term (current) use of anticoagulants: Secondary | ICD-10-CM

## 2011-12-15 DIAGNOSIS — I639 Cerebral infarction, unspecified: Secondary | ICD-10-CM

## 2011-12-15 DIAGNOSIS — I4891 Unspecified atrial fibrillation: Secondary | ICD-10-CM

## 2011-12-15 DIAGNOSIS — I6350 Cerebral infarction due to unspecified occlusion or stenosis of unspecified cerebral artery: Secondary | ICD-10-CM

## 2011-12-17 ENCOUNTER — Telehealth: Payer: Self-pay | Admitting: Internal Medicine

## 2011-12-17 MED ORDER — AMOXICILLIN-POT CLAVULANATE 875-125 MG PO TABS: 1.0000 | ORAL_TABLET | Freq: Two times a day (BID) | ORAL | Status: DC

## 2011-12-17 NOTE — Telephone Encounter (Signed)
Spoke with pt's daughter. She states that pt is having all bottom teeth extracted in 1 wk. She states that the oral surgeon, Dr. Shea Evans wants him to have another round of augmentin prior to this procedure. MW out of the office, will forward to doc of the day. SN, please advise, thanks! Allergies  Allergen Reactions  . Ace Inhibitors     Renal insufficiency  . Beta Adrenergic Blockers     bradycardia  . Statins

## 2011-12-17 NOTE — Telephone Encounter (Signed)
Spoke with [name] and advised rx is being sent to pharm. She verbalized understanding and states nothing further needed.

## 2011-12-17 NOTE — Telephone Encounter (Signed)
Per SN---ok to refill the augmentin 875 mg #14  1 po bid.  thanks

## 2011-12-22 ENCOUNTER — Other Ambulatory Visit: Payer: Self-pay

## 2011-12-22 ENCOUNTER — Telehealth: Payer: Self-pay | Admitting: Pulmonary Disease

## 2011-12-22 MED ORDER — WARFARIN SODIUM 5 MG PO TABS: 5.0000 mg | ORAL_TABLET | Freq: Every day | ORAL | Status: DC

## 2011-12-22 NOTE — Telephone Encounter (Signed)
Spoke with the pt's daughter. She states that the pt has ov with Dr. Darrick Huntsman for 12-24-11 and he is to have cxr done at this time. She states that pt is needing letter from you stating okay to have teeth extracted once the cxr is done. She wants to know if the pt will need f/u here before this letter can be done, or if you can just look at the cxr and give recs based upon those results. Please advise, thanks!

## 2011-12-22 NOTE — Telephone Encounter (Signed)
Spoke with daughter and notified of recs per Dr. Sherene Sires and she verbalized understanding, states nothing further needed.

## 2011-12-22 NOTE — Telephone Encounter (Signed)
That's fine  But Tullo will have the" final call" on clearing him for surgery - if she wants me to weigh in on the cxr she can call me and talk it over.

## 2011-12-24 ENCOUNTER — Ambulatory Visit (INDEPENDENT_AMBULATORY_CARE_PROVIDER_SITE_OTHER)
Admission: RE | Admit: 2011-12-24 | Discharge: 2011-12-24 | Disposition: A | Discharge status: Home / Self Care | Payer: Medicare Other | Source: Ambulatory Visit | Attending: Internal Medicine | Admitting: Internal Medicine

## 2011-12-24 ENCOUNTER — Ambulatory Visit (INDEPENDENT_AMBULATORY_CARE_PROVIDER_SITE_OTHER): Payer: Medicare Other | Admitting: Internal Medicine

## 2011-12-24 ENCOUNTER — Encounter: Payer: Self-pay | Admitting: Internal Medicine

## 2011-12-24 VITALS — BP 130/52 | HR 57 | Temp 98.3°F | Resp 16 | Wt 209.5 lb

## 2011-12-24 DIAGNOSIS — E785 Hyperlipidemia, unspecified: Secondary | ICD-10-CM

## 2011-12-24 DIAGNOSIS — I10 Essential (primary) hypertension: Secondary | ICD-10-CM

## 2011-12-24 DIAGNOSIS — J189 Pneumonia, unspecified organism: Secondary | ICD-10-CM

## 2011-12-24 DIAGNOSIS — I69391 Dysphagia following cerebral infarction: Secondary | ICD-10-CM

## 2011-12-24 DIAGNOSIS — D509 Iron deficiency anemia, unspecified: Secondary | ICD-10-CM

## 2011-12-24 DIAGNOSIS — I69991 Dysphagia following unspecified cerebrovascular disease: Secondary | ICD-10-CM

## 2011-12-24 MED ORDER — HYDROCODONE-ACETAMINOPHEN 10-500 MG PO TABS: 1.0000 | ORAL_TABLET | Freq: Four times a day (QID) | ORAL | Status: DC | PRN

## 2011-12-24 MED ORDER — DIAZEPAM 5 MG PO TABS: 2.5000 mg | ORAL_TABLET | Freq: Two times a day (BID) | ORAL | Status: DC | PRN

## 2011-12-24 MED ORDER — FENOFIBRATE 145 MG PO TABS: 145.0000 mg | ORAL_TABLET | Freq: Every day | ORAL | Status: DC

## 2011-12-24 NOTE — Progress Notes (Signed)
Patient ID: Scott Norman, male   DOB: [birth year], 78 y.o.   MRN: [id number]  Patient Active Problem List  Diagnosis  . Atrial fibrillation  . Aortic stenosis  . Hyperlipidemia  . Hypertension  . Bradycardia  . CKD (chronic kidney disease)  . Encounter for long-term (current) use of anticoagulants  . CVA (cerebrovascular accident)  . Atrial flutter  . Type II diabetes mellitus with nephropathy  . Right renal artery stenosis  . Valvular cardiomyopathy  . Screening for osteoporosis  . Knee pain, right  . Acute on chronic diastolic CHF (congestive heart failure)  . Pneumonia, organism unspecified  . Iron deficiency anemia  . Dysphagia as late effect of stroke    Subjective:  CC:   Chief Complaint  Patient presents with  . Follow-up    from Dr. Sherene Sires    HPI:   Scott Goh Fosteris a 78 y.o. male who presents for follow up on pneumonia and chronic issues including hypertension, anemia, dysphagia, secondary to CVA, and chronic knee pain secondary to DJD awaiting surgery.  He was referred to oral surgeon by Dr.  Sherene Sires after evaluation and treatment for aspiration pneumonia for concern about removal of decaying teeth prior to his anticipated knee surgery.  He continues to have a productive  Cough and has been given a second round of antibiotics (Augmentin ) and is having some diarrhea.  He is wondering how long he needs to be on the oral antibiotics.  Neither Dr. Sherene Sires nor his oral surgeon have discussed this with him.  He has been having some diarrhea. He has been having difficulty managing certain food consistencies secondary to dysphagia, namely bread, since several teeth were removed.  Her is getting disheartened by  The delays and setbacks he has had.  His hemoglobin has responded to iron therapy given by Dr, Orlie Dakin .    Past Medical History  Diagnosis Date  . Diabetes mellitus   . Hypertension   . Degenerative disc disease, lumbar     back surgery 2011  . Gout   . Atrial  fibrillation   . PVD (peripheral vascular disease)   . Aortic stenosis   . Hyperlipidemia   . Chronic kidney disease (CKD), stage III (moderate)   . H/O colonoscopy June 2007    normal  . Screening for osteoporosis Feb 2012    T -1.3 hip  . Stroke 2012    Rmbloic, with expressive aphasia  . Renal artery stenosis, native, bilateral     s/p right RA stent 2012, pending left   . Heart murmur   . Anxiety   . Sleep apnea     does not use CPAP    Past Surgical History  Procedure Date  . Shoulder surgery   . Lumbar spine surgery   . Renal artery stent 04/2011  . Tonsillectomy   . Appendectomy   . Throat surgery 1993    excess tissue removed to help with snoring         The following portions of the patient's history were reviewed and updated as appropriate: Allergies, current medications, and problem list.    Review of Systems:   Review of Systems  Constitutional: Positive for weight loss. Negative for fever, chills and diaphoresis.  HENT: Negative.   Eyes: Negative.   Respiratory: Positive for cough and sputum production.   Cardiovascular: Negative.   Gastrointestinal: Positive for diarrhea.  Genitourinary: Negative.   Musculoskeletal: Positive for joint pain.  Skin: Negative for itching and  rash.  Neurological:       Dysphagia, aphasia  Endo/Heme/Allergies: Negative.   Psychiatric/Behavioral: The patient has insomnia.        History   Social History  . Marital Status: Legally Separated    Spouse Name: N/A    Number of Children: N/A  . Years of Education: N/A   Occupational History  . retired    Social History Main Topics  . Smoking status: Never Smoker   . Smokeless tobacco: Never Used  . Alcohol Use: 1.2 oz/week    2 Glasses of wine per week     rarely  . Drug Use: No  . Sexually Active: No   Other Topics Concern  . Not on file   Social History Narrative   Lives with second wife,  often left alone for hours on end.  ,  Meals delayed by  wife's absence from hone until 7 or 8 PM.  Daughter [name] actively involved , Ambulates with walker.     Objective:  BP 130/52  Pulse 57  Temp 98.3 F (36.8 C) (Oral)  Resp 16  Wt 209 lb 8 oz (95.029 kg)  SpO2 97%  General appearance: alert, cooperative and appears stated age Ears: normal TM's and external ear canals both ears Throat: lips, mucosa, and tongue normal; teeth and gums normal Neck: no adenopathy, no carotid bruit, supple, symmetrical, trachea midline and thyroid not enlarged, symmetric, no tenderness/mass/nodules Back: symmetric, no curvature. ROM normal. No CVA tenderness. Lungs: clear to auscultation bilaterally Heart: regular rate and rhythm, S1, S2 normal, no murmur, click, rub or gallop Abdomen: soft, non-tender; bowel sounds normal; no masses,  no organomegaly Pulses: 2+ and symmetric Skin: Skin color, texture, turgor normal. No rashes or lesions Lymph nodes: Cervical, supraclavicular, and axillary nodes normal.  Assessment and Plan:  Hypertension Control has been difficult,  More problematic since his ace inhibitor was stopped due to concerns of angioedema. His bp was relatively well controlled today so no changes were made.   Pneumonia, organism unspecified Repeat cxr shows interval improvement but not resolution. He will need a repeat cxr in 4 to 6 weeks to ensure resolution . He has had two weeks of augmentin and is having episodes of diarrhea without fevers or abdominal cramping .  Stopping antibioitcs,  Adding daily [place]  yogurt.   Iron deficiency anemia His hgb has improved to 11.2 with iron supplementation  And EPO.   Dysphagia as late effect of stroke With choking noted with bread ingestion.  ACE Inhibitor was suspended two weeks ago due to concern for angioedema contributing to symptoms. Discussed diet ;  recommended Ensure/Boost/yogurt   Updated Medication List Outpatient Encounter Prescriptions as of 12/24/2011  Medication Sig Dispense  Refill  . amLODipine (NORVASC) 5 MG tablet Take 2.5 mg by mouth daily.       . cloNIDine (CATAPRES) 0.3 MG tablet       . diazepam (VALIUM) 5 MG tablet Take 0.5 tablets (2.5 mg total) by mouth 2 (two) times daily as needed.  30 tablet  2  . docusate sodium (COLACE) 100 MG capsule Take 100 mg by mouth 3 (three) times daily as needed. For stool softner      . doxazosin (CARDURA) 2 MG tablet Take 4 mg by mouth at bedtime.       . furosemide (LASIX) 20 MG tablet Take 20 mg by mouth daily as needed. Fluid build up      . glipiZIDE (GLUCOTROL) 5 MG tablet  Take 5 mg by mouth 2 (two) times daily before a meal. Unsure of dose, only takes as needed      . glucose blood (BAYER CONTOUR NEXT TEST) test strip Use as instructed  100 each  12  . hydrALAZINE (APRESOLINE) 100 MG tablet Take 1.5 tablets (150 mg total) by mouth 4 (four) times daily.  135 tablet  6  . HYDROcodone-acetaminophen (LORTAB) 10-500 MG per tablet Take 1 tablet by mouth every 6 (six) hours as needed. For pain  120 tablet  2  . isosorbide mononitrate (IMDUR) 60 MG 24 hr tablet Takes 2 tablets twice a day.      . Sod Citrate-Citric Acid (CYTRA-2) 500-334 MG/5ML SOLN Take by mouth.      . warfarin (COUMADIN) 5 MG tablet Take 1 tablet (5 mg total) by mouth daily. Adjust dose for goal INR 2.0-3.0.  Take as directed by anticoagulation clinic  35 tablet  3  . DISCONTD: cloNIDine (CATAPRES) 0.3 MG tablet TAKE 1 TABLET 3 TIMES DAILY  90 tablet  6  . DISCONTD: diazepam (VALIUM) 5 MG tablet Take 2.5 mg by mouth 2 (two) times daily as needed.       Marland Kitchen DISCONTD: gemfibrozil (LOPID) 600 MG tablet Take 600 mg by mouth 2 (two) times daily before a meal.        . DISCONTD: HYDROcodone-acetaminophen (LORTAB) 10-500 MG per tablet Take 1 tablet by mouth every 6 (six) hours as needed. For pain      . fenofibrate (TRICOR) 145 MG tablet Take 1 tablet (145 mg total) by mouth daily.  30 tablet  6  . DISCONTD: amoxicillin-clavulanate (AUGMENTIN) 875-125 MG per tablet  Take 1 tablet by mouth 2 (two) times daily.  14 tablet  0  . DISCONTD: olmesartan (BENICAR) 20 MG tablet Take 1 tablet (20 mg total) by mouth daily.      Marland Kitchen DISCONTD: oxyCODONE-acetaminophen (PERCOCET) 5-325 MG per tablet Take 1 tablet by mouth 4 times daily.      Marland Kitchen DISCONTD: Red Yeast Rice 600 MG TABS Take 1 tablet by mouth daily.          Orders Placed This Encounter  Procedures  . DG Chest 2 View    No Follow-up on file.

## 2011-12-25 ENCOUNTER — Encounter: Payer: Self-pay | Admitting: Internal Medicine

## 2011-12-25 DIAGNOSIS — I69391 Dysphagia following cerebral infarction: Secondary | ICD-10-CM | POA: Insufficient documentation

## 2011-12-25 DIAGNOSIS — D509 Iron deficiency anemia, unspecified: Secondary | ICD-10-CM | POA: Insufficient documentation

## 2011-12-25 NOTE — Assessment & Plan Note (Signed)
Control has been difficult,  More problematic since his ace inhibitor was stopped due to concerns of angioedema. His bp was relatively well controlled today so no changes were made.

## 2011-12-25 NOTE — Assessment & Plan Note (Addendum)
Repeat cxr shows interval improvement but not resolution. He will need a repeat cxr in 4 to 6 weeks to ensure resolution . He has had two weeks of augmentin and is having episodes of diarrhea without fevers or abdominal cramping .  Stopping antibioitcs,  Adding daily [place]  yogurt.

## 2011-12-25 NOTE — Assessment & Plan Note (Signed)
With choking noted with bread ingestion.  ACE Inhibitor was suspended two weeks ago due to concern for angioedema contributing to symptoms. Discussed diet ;  recommended Ensure/Boost/yogurt

## 2011-12-25 NOTE — Assessment & Plan Note (Signed)
His hgb has improved to 11.2 with iron supplementation  And EPO.

## 2011-12-27 ENCOUNTER — Telehealth: Payer: Self-pay | Admitting: Internal Medicine

## 2011-12-27 NOTE — Telephone Encounter (Signed)
Agree with radiology rec for f/u cxr in 6 weeks but it does look better for sure

## 2011-12-27 NOTE — Telephone Encounter (Signed)
I spoke with pt daughter and is aware of this. Nothing further was needed

## 2011-12-27 NOTE — Telephone Encounter (Signed)
Pt was seen by Dr. Sherene Sires on 12/08/11:  Patient Instructions     Stop vasotec and start benicar 20 mg one daily instead  Augmentin 875 twice daily with big glass water, then yogurt for lunch and let your coumadin clinic know you're on it  Repeat cxr in 2 weeks to clear you for surgery and recheck your blood pressure, either her or with Dr Darrick Huntsman (sent her a copy)   ---  Called, spoke with [name].  States pt did have CXR done on Friday at Dr. Melina Schools office.  States her office said pna has not completely cleared, would need repeat CXR in 6 wks, and no further abx at this time.  [name] would like Dr. Sherene Sires to look at CXR and advise from this standpoint. ? Does he feel anymore abx is needed.  Dr. Sherene Sires, pls advise.  Thank you.

## 2011-12-30 ENCOUNTER — Ambulatory Visit: Payer: Self-pay

## 2011-12-30 ENCOUNTER — Ambulatory Visit: Payer: Self-pay | Admitting: Oncology

## 2012-01-04 ENCOUNTER — Telehealth: Payer: Self-pay

## 2012-01-04 NOTE — Telephone Encounter (Signed)
I called pt's dtr, [name], to inform her of pt's upcoming appt with Dr. Mariah Milling tomm. She verb. Understanding and reminded me she had dropped off pt's BP log yesterday. I have this info and she is concerned about the fluctuations in his BP (99/39-198/68). i informed her Dr. Mariah Milling would address tomm at appt. She also wants [place] to know pt may need teeth extracted and clearance for this may need to be obtained at tomm appt.   I will make Dr. Mariah Milling aware

## 2012-01-05 ENCOUNTER — Encounter: Payer: Self-pay | Admitting: Cardiovascular Disease

## 2012-01-05 ENCOUNTER — Ambulatory Visit (INDEPENDENT_AMBULATORY_CARE_PROVIDER_SITE_OTHER): Payer: Medicare Other

## 2012-01-05 ENCOUNTER — Ambulatory Visit (INDEPENDENT_AMBULATORY_CARE_PROVIDER_SITE_OTHER): Payer: Medicare Other | Admitting: Cardiovascular Disease

## 2012-01-05 VITALS — BP 132/60 | HR 51 | Ht 66.0 in | Wt 212.2 lb

## 2012-01-05 DIAGNOSIS — I10 Essential (primary) hypertension: Secondary | ICD-10-CM

## 2012-01-05 DIAGNOSIS — I4891 Unspecified atrial fibrillation: Secondary | ICD-10-CM

## 2012-01-05 DIAGNOSIS — I4892 Unspecified atrial flutter: Secondary | ICD-10-CM

## 2012-01-05 DIAGNOSIS — I509 Heart failure, unspecified: Secondary | ICD-10-CM

## 2012-01-05 DIAGNOSIS — I350 Nonrheumatic aortic (valve) stenosis: Secondary | ICD-10-CM

## 2012-01-05 DIAGNOSIS — Z7901 Long term (current) use of anticoagulants: Secondary | ICD-10-CM

## 2012-01-05 DIAGNOSIS — I5033 Acute on chronic diastolic (congestive) heart failure: Secondary | ICD-10-CM

## 2012-01-05 DIAGNOSIS — I359 Nonrheumatic aortic valve disorder, unspecified: Secondary | ICD-10-CM

## 2012-01-05 DIAGNOSIS — I639 Cerebral infarction, unspecified: Secondary | ICD-10-CM

## 2012-01-05 DIAGNOSIS — I6350 Cerebral infarction due to unspecified occlusion or stenosis of unspecified cerebral artery: Secondary | ICD-10-CM

## 2012-01-05 LAB — POCT INR: INR: 2.1

## 2012-01-05 NOTE — Assessment & Plan Note (Signed)
This can be watched periodically with echocardiography, possible repeat next year.

## 2012-01-05 NOTE — Patient Instructions (Addendum)
You are doing well. For low blood pressure, hold the noon clonidine and /or the hydralazine. If the blood pressure continues to run low, call the office  Please call us if you have new issues that need to be addressed before your next appt.  Your physician wants you to follow-up in: 6 months.  You will receive a reminder letter in the mail two months in advance. If you don't receive a letter, please call our office to schedule the follow-up appointment.

## 2012-01-05 NOTE — Assessment & Plan Note (Signed)
He appears relatively euvolemic, in fact I'm concerned about him not drinking enough. This could possibly explain some of his low blood pressures. I suggested he again hold his diuretic, taking this for worsening edema or shortness of breath.

## 2012-01-05 NOTE — Assessment & Plan Note (Signed)
He has labile blood pressures that are challenging to control. I am more concerned about his low blood pressures. He reports these are in the afternoon predominantly. I suggested he closely monitor his blood pressure as he is doing and if it is low towards the middle of the day, that he hold his clonidine first, hydralazine second. He is bradycardic and if possible, would like to slowly wean the clonidine as tolerated.

## 2012-01-05 NOTE — Assessment & Plan Note (Signed)
Maintaining normal sinus rhythm today 

## 2012-01-05 NOTE — Progress Notes (Signed)
Patient ID: Scott Norman, male    DOB: June 25, 1930, 78 y.o.   MRN: [id number]  HPI Comments: 78 yo with history of paroxysmal atrial fibrillation, CKD, and mild to moderate aortic stenosis, recent hospitalization for large cardioembolic stroke ([place] in 5/12 with a left MCA cardioembolic stroke),   treated with tPA, noted to be in atrial fibrillation  while in the hospital with residual profound expressive aphasia, now on coumadin,  with weight gain, worsening edema and abdominal swelling in 2012 , Severe hypertension, found to have renal artery stenosis, now status post stent placement. He presents for routine followup  He reports that since his last clinic visit, his weight is down 24 pounds. He is depressed, not eating. The stress from his wife has somewhat resolved as she has moved out. He does have rare episodes of dizziness which he attributes to low blood pressure. He presents today with his blood pressure numbers which shows a labile pressure ranging from systolic pressure of 100 up to 190. He feels it is low in the afternoon. He does not drink much and is not taking Lasix on a regular basis. He is trying to use his bike and stay active.  He is scheduled to have numerous teeth removed on his bottom jaw, some on the top.  He had a recent pneumonia and had 2 courses of antibiotics with associated diarrhea, in July Diarrhea has resolved essentially but does take occasional Imodium.  EKG today shows normal sinus rhythm with rate 51 pulse, 1 st degree AV block, RBBB, nonspecific ST and T wave abnormality V5, V6, 1, aVL as well as inferior leads    Outpatient Encounter Prescriptions as of 01/05/2012  Medication Sig Dispense Refill  . amLODipine (NORVASC) 5 MG tablet Take 2.5 mg by mouth daily.       . cloNIDine (CATAPRES) 0.3 MG tablet Take 0.3 mg by mouth 3 (three) times daily.       . diazepam (VALIUM) 5 MG tablet Take 0.5 tablets (2.5 mg total) by mouth 2 (two) times daily as needed.  30  tablet  2  . docusate sodium (COLACE) 100 MG capsule Take 100 mg by mouth 3 (three) times daily as needed. For stool softner      . doxazosin (CARDURA) 2 MG tablet Take 4 mg by mouth at bedtime.       . fenofibrate (TRICOR) 145 MG tablet Take 1 tablet (145 mg total) by mouth daily.  30 tablet  6  . furosemide (LASIX) 40 MG tablet Take 40 mg by mouth daily as needed.      Marland Kitchen glipiZIDE (GLUCOTROL) 5 MG tablet Take 5 mg by mouth 2 (two) times daily before a meal. Unsure of dose, only takes as needed      . glucose blood (BAYER CONTOUR NEXT TEST) test strip Use as instructed  100 each  12  . hydrALAZINE (APRESOLINE) 100 MG tablet Take 1.5 tablets (150 mg total) by mouth 4 (four) times daily.  135 tablet  6  . HYDROcodone-acetaminophen (LORTAB) 10-500 MG per tablet Take 1 tablet by mouth every 6 (six) hours as needed. For pain  120 tablet  2  . isosorbide mononitrate (IMDUR) 60 MG 24 hr tablet Takes 2 tablets twice a day.      . Sod Citrate-Citric Acid (CYTRA-2) 500-334 MG/5ML SOLN Two Table spoons twice a day.      . warfarin (COUMADIN) 5 MG tablet Take 1 tablet (5 mg total) by mouth daily.  Adjust dose for goal INR 2.0-3.0.  Take as directed by anticoagulation clinic  35 tablet  3  . DISCONTD: furosemide (LASIX) 20 MG tablet Take 40 mg by mouth daily as needed. Fluid build up          Review of Systems  Constitutional: Positive for unexpected weight change.  HENT: Negative.   Eyes: Negative.   Respiratory: Negative.   Cardiovascular: Negative.   Gastrointestinal: Negative.   Musculoskeletal: Positive for gait problem.  Skin: Negative.   Neurological: Negative.        Speech difficulty  Hematological: Negative.   Psychiatric/Behavioral: Negative.   All other systems reviewed and are negative.   BP 132/60  Pulse 51  Ht 5\' 6"  (1.676 m)  Wt 212 lb 4 oz (96.276 kg)  BMI 34.26 kg/m2  Physical Exam  Nursing note and vitals reviewed. Constitutional: He is oriented to person, place, and  time. He appears well-developed and well-nourished.  HENT:  Head: Normocephalic.  Nose: Nose normal.  Mouth/Throat: Oropharynx is clear and moist.  Eyes: Conjunctivae are normal. Pupils are equal, round, and reactive to light.  Neck: Normal range of motion. Neck supple. No JVD present. Carotid bruit is present.  Cardiovascular: Normal rate, S1 normal, S2 normal and intact distal pulses.  An irregularly irregular rhythm present.  Occasional extrasystoles are present. Exam reveals no gallop and no friction rub.   Murmur heard.  Crescendo systolic murmur is present with a grade of 2/6  Pulmonary/Chest: Effort normal and breath sounds normal. No respiratory distress. He has no wheezes. He has no rales. He exhibits no tenderness.  Abdominal: Soft. Bowel sounds are normal. He exhibits no distension. There is no tenderness.  Musculoskeletal: Normal range of motion. He exhibits no tenderness.  Lymphadenopathy:    He has no cervical adenopathy.  Neurological: He is alert and oriented to person, place, and time. Coordination normal.  Skin: Skin is warm and dry. No rash noted. No erythema.  Psychiatric: He has a normal mood and affect. His behavior is normal. Judgment and thought content normal.           Assessment and Plan

## 2012-01-06 ENCOUNTER — Telehealth: Payer: Self-pay | Admitting: *Deleted

## 2012-01-06 ENCOUNTER — Encounter: Payer: Self-pay | Admitting: Internal Medicine

## 2012-01-06 NOTE — Telephone Encounter (Signed)
Spoke with pt daughter and she mentioned that her father has a BP of 200/60 and she is concerned due to him just coming in yesterday due to low blood pressure and being told to hold the clonidine. She would like to know what she should do?

## 2012-01-06 NOTE — Telephone Encounter (Signed)
Please reassure dtr, pt's BPs are labile. Per Dr. Windell Hummingbird office note yesterday, he is more concerned about the low BPs. They should continue to monitor BP and take meds as prescribed.  Call if BPs are remaining CONSISTENTLY elevated.  Otherwise, they have been fluctuating.

## 2012-01-06 NOTE — Telephone Encounter (Signed)
Notified patient's daughter regarding Scott Norman's blood pressure being elevated. Told the daughter that Dr. Mariah Milling is concerned with the lower blood pressure readings. I  reassured [name] (daughter) that she should continue to monitor blood pressure and if elevated consistently then should contact our office. [name] will have blood pressure monitored through out the day with keeping a record to see if the blood pressure drops during the night and even in the morning. She will contact our office if she notice any significant change in the blood pressures.

## 2012-01-07 ENCOUNTER — Telehealth: Payer: Self-pay | Admitting: Cardiovascular Disease

## 2012-01-07 ENCOUNTER — Other Ambulatory Visit: Payer: Self-pay

## 2012-01-07 ENCOUNTER — Telehealth: Payer: Self-pay

## 2012-01-07 MED ORDER — FUROSEMIDE 40 MG PO TABS: 20.0000 mg | ORAL_TABLET | ORAL | Status: DC

## 2012-01-07 NOTE — Telephone Encounter (Signed)
Pt caregiver stop by office with BP readings.  930 am  182/63 1020 am  110/46 1246 pm  180/63 220 pm  200/60 313 pm  173/68 512 pm  174/65 700 pm  204/92 810 pm  182/65 910 pm  205/68 1005 pm  203/78 1100 pm  205/94

## 2012-01-07 NOTE — Telephone Encounter (Signed)
Error

## 2012-01-07 NOTE — Telephone Encounter (Signed)
I received a t/c from pt's caregiver, [name].  She says she is aware of my recent conversation with [name], but is concerned about pt's BP=190/78. She says it has been this high since "lunch". She confirms pt has had both lasix 40 mg this am, clonidine at 1200, and hydralazine  1 1/2 tablets.  I advised to try extra 2.5 mg amlodipine to see if BP improves, then resume usual dosing schedule. Understanding verb.  She will call us back should BP not improve.

## 2012-01-07 NOTE — Telephone Encounter (Signed)
I spoke with patient's dtr, who verb understanding regarding new plan.  She asks what to do if BP rises again over w/e. I encouraged her to call our after hours # with questions.  She also knows to try an extra 1/2 dose amlodipine if BP stays elevated despite new med changes (she does this on occasion). She mentions pt used to wear a clonidine patch and wonders if this may be beneficial to pt. I told her I would check with Dr. Mariah Milling and call her back. Understanding verb.

## 2012-01-07 NOTE — Telephone Encounter (Signed)
I spoke with Dr. Mariah Milling who suggests have pt "resume clonidine as previously taking. He should also take lasix 20 mg 3x a week". V.O Dr. Alvis Lemmings, RN

## 2012-01-07 NOTE — Telephone Encounter (Signed)
He would require 2 patches at a time Sometimes this can be expensive I wonder if this was changed to the pill secondary to price

## 2012-01-09 MED ORDER — DOXAZOSIN MESYLATE 4 MG PO TABS: 4.0000 mg | ORAL_TABLET | Freq: Every day | ORAL | Status: DC

## 2012-01-10 ENCOUNTER — Telehealth: Payer: Self-pay | Admitting: Internal Medicine

## 2012-01-10 NOTE — Telephone Encounter (Signed)
i sent daughter an e mail concerning this request earlier. Do not recommend repeating the chest x-ray because it is not going to change our management. The chest x-ray will lag behind clinical improvement for at least 6 weeks.

## 2012-01-10 NOTE — Telephone Encounter (Signed)
[name] DAVIDSON CALLED ABOUT MR Wurm. SHE STATED THEY TOOK MR [name] TO DR Mariah Milling LAST WED AND DR Mariah Milling STATED THAT MR FOSTERS LUNGS WERE CLEAR [name] WANTED TO KNOW IF THEY COULD HAVE HIS CHEST XRAY DONE SOONER THAN 6 WEEK FROM THE LAST XRAY THEY WANTED TO GET HIS TEETH PULLED AND HAVE TO HAVE TO WAIT TILL PNEUMONIA CLEARS UP

## 2012-01-10 NOTE — Telephone Encounter (Signed)
Mr Claxton daughter was notified.  She is wondering how accurate his BP machine is and plans to bring his monitor in to have it checked.

## 2012-01-10 NOTE — Telephone Encounter (Signed)
Daughter notified 

## 2012-01-11 ENCOUNTER — Ambulatory Visit (INDEPENDENT_AMBULATORY_CARE_PROVIDER_SITE_OTHER): Payer: Medicare Other | Admitting: Internal Medicine

## 2012-01-11 ENCOUNTER — Encounter: Payer: Self-pay | Admitting: Internal Medicine

## 2012-01-11 VITALS — BP 172/64 | HR 58 | Temp 98.5°F | Resp 16 | Wt 206.8 lb

## 2012-01-11 DIAGNOSIS — I4891 Unspecified atrial fibrillation: Secondary | ICD-10-CM

## 2012-01-11 DIAGNOSIS — R5383 Other fatigue: Secondary | ICD-10-CM

## 2012-01-11 DIAGNOSIS — R5381 Other malaise: Secondary | ICD-10-CM

## 2012-01-11 DIAGNOSIS — I10 Essential (primary) hypertension: Secondary | ICD-10-CM

## 2012-01-11 DIAGNOSIS — J189 Pneumonia, unspecified organism: Secondary | ICD-10-CM

## 2012-01-11 LAB — CANCER CENTER HEMOGLOBIN: HGB: 11.5 g/dL — ABNORMAL LOW (ref 13.0–18.0)

## 2012-01-11 MED ORDER — MIRTAZAPINE 7.5 MG HALF TABLET: 7.5000 mg | ORAL_TABLET | Freq: Every day | ORAL | Status: DC

## 2012-01-11 MED ORDER — ONDANSETRON 4 MG PO TBDP: 4.0000 mg | ORAL_TABLET | Freq: Three times a day (TID) | ORAL | Status: AC | PRN

## 2012-01-11 NOTE — Patient Instructions (Signed)
Call me when the oral surgery is planned and we will.  Suspend the coumadin and use lovenox as a bridge  I will call in the antidepressant:  Take at bedtime instead of the valium. (mirtazipine) ;Increase the dose to 15 mg after one week  Zofran as needed for nausea .

## 2012-01-11 NOTE — Progress Notes (Signed)
Patient ID: KEWON STATLER, male   DOB: [birth year], 78 y.o.   MRN: [id number]  Patient Active Problem List  Diagnosis  . Atrial fibrillation  . Aortic stenosis  . Hyperlipidemia  . Hypertension  . Bradycardia  . CKD (chronic kidney disease)  . Encounter for long-term (current) use of anticoagulants  . CVA (cerebrovascular accident)  . Atrial flutter  . Type II diabetes mellitus with nephropathy  . Right renal artery stenosis  . Valvular cardiomyopathy  . Screening for osteoporosis  . Knee pain, right  . Acute on chronic diastolic CHF (congestive heart failure)  . Pneumonia, organism unspecified  . Iron deficiency anemia  . Dysphagia as late effect of stroke  . Malaise    Subjective:  CC:   Chief Complaint  Patient presents with  . Fatigue    HPI:   AKING KLABUNDE a 78 y.o. male who presents with continued malaise,  poor appetite, nrew onset constipation and labile hypertension. Patient was seen by cardiologist last week for similar symptoms and advice on adjustment of medications based on home blood pressures was given. uggestion of dehydration was also made given his low blood pressures and he was instructed to increase his hydration. Since then he has been drinking adequate amounts of water per her family. He also drinks  butter milk and cran berry juice.  But no energy and no appetite.  Daughter is concerned that the teeth that need removal are causing him to feel poorly and is requesting that we place him on empiric antibiotics. Initially she was requesting that we repeat his chest x-ray to prove that the pneumonia had resolved. We discussed the timing of repeat chest x-rays and the need to wait 4-6 weeks for chest x-ray clear. Best discussed the fact that it pneumonia is determined to be resolved when the symptoms of purulent sputum and productive cough and fevers have resolved. Per family he is no longer coughing and has had no fevers. She is worried that he is lethargic.   his caregiver also states that the Vicodin which we had started him on over a month ago is no longer as effective and he appears to be in more pain. He is also having constipation. He has not been receiving MiraLAX or next laxative on a daily basis as I had recommended when I prescribed the narcotics.   Past Medical History  Diagnosis Date  . Diabetes mellitus   . Hypertension   . Degenerative disc disease, lumbar     back surgery 2011  . Gout   . Atrial fibrillation   . PVD (peripheral vascular disease)   . Aortic stenosis   . Hyperlipidemia   . Chronic kidney disease (CKD), stage III (moderate)   . H/O colonoscopy June 2007    normal  . Screening for osteoporosis Feb 2012    T -1.3 hip  . Stroke 2012    Rmbloic, with expressive aphasia  . Renal artery stenosis, native, bilateral     s/p right RA stent 2012, pending left   . Heart murmur   . Anxiety   . Sleep apnea     does not use CPAP    Past Surgical History  Procedure Date  . Shoulder surgery   . Lumbar spine surgery   . Renal artery stent 04/2011  . Tonsillectomy   . Appendectomy   . Throat surgery 1993    excess tissue removed to help with snoring  The following portions of the patient's history were reviewed and updated as appropriate: Allergies, current medications, and problem list.    Review of Systems:   A comprehensive ROS was done and positive for pain, malaise, and constipation.  The rest was negative.    History   Social History  . Marital Status: Legally Separated    Spouse Name: N/A    Number of Children: N/A  . Years of Education: N/A   Occupational History  . retired    Social History Main Topics  . Smoking status: Never Smoker   . Smokeless tobacco: Never Used  . Alcohol Use: 1.2 oz/week    2 Glasses of wine per week     rarely  . Drug Use: No  . Sexually Active: No   Other Topics Concern  . Not on file   Social History Narrative   Lives with second wife,  often  left alone for hours on end.  ,  Meals delayed by wife's absence from hone until 7 or 8 PM.  Daughter [name] actively involved , Ambulates with walker.     Objective:  BP 172/64  Pulse 58  Temp 98.5 F (36.9 C) (Oral)  Resp 16  Wt 206 lb 12 oz (93.781 kg)  SpO2 96%  General appearance: alert, cooperative and appears stated age Ears: normal TM's and external ear canals both ears Throat: lips, mucosa, and tongue normal; teeth and gums normal Neck: no adenopathy, no carotid bruit, supple, symmetrical, trachea midline and thyroid not enlarged, symmetric, no tenderness/mass/nodules Back: symmetric, no curvature. ROM normal. No CVA tenderness. Lungs: clear to auscultation bilaterally Heart: regular rate and rhythm, S1, S2 normal, no murmur, click, rub or gallop Abdomen: soft, non-tender; bowel sounds normal; no masses,  no organomegaly Pulses: 2+ and symmetric Skin: Skin color, texture, turgor normal. No rashes or lesions Lymph nodes: Cervical, supraclavicular, and axillary nodes normal.  Assessment and Plan:  Hypertension Daughter notes that his blood pressures have been labile because she has withheld all  medication for blood pressure.Marland Kitchen Resume her prior instructions.  Atrial fibrillation With prior large embolic stroke. Daughter is worried about him being off of Coumadin for his oral I recommended that we use Lovenox as a bridge. When she has the oral surgery set up she will call me and I will give him instructions on when to stop the Coumadin.  Pneumonia, organism unspecified Now clinically resolved. I do not plan to repeat the chest x-ray to prove that the pneumonia has resolved. I have written a letter to Dr. Shea Evans stating that his pneumonia has clinically resolved and he may proceed with oral surgery.  Malaise Understandably multifactorial. He has anemia, chronic pain, a debilitating stroke, aortic stenosis, atrial fibrillation. I suggested that he may be depressed due to  his multiple comorbidities and debilitation. Trial of mirtazapine. We will hold his diazepam that they're giving to him the evening for insomnia and start with 7.5 mg of mirtazapine. We'll increase the dose to 15 mg after one week if it is tolerated. Return one month   Updated Medication List Outpatient Encounter Prescriptions as of 01/11/2012  Medication Sig Dispense Refill  . amLODipine (NORVASC) 5 MG tablet Take 2.5 mg by mouth daily.       . cloNIDine (CATAPRES) 0.3 MG tablet Take 0.3 mg by mouth 3 (three) times daily.       . diazepam (VALIUM) 5 MG tablet Take 0.5 tablets (2.5 mg total) by mouth 2 (two) times daily  as needed.  30 tablet  2  . docusate sodium (COLACE) 100 MG capsule Take 100 mg by mouth 3 (three) times daily as needed. For stool softner      . doxazosin (CARDURA) 4 MG tablet Take 1 tablet (4 mg total) by mouth at bedtime.  30 tablet  3  . fenofibrate (TRICOR) 145 MG tablet Take 1 tablet (145 mg total) by mouth daily.  30 tablet  6  . furosemide (LASIX) 40 MG tablet Take 0.5 tablets (20 mg total) by mouth 3 (three) times a week.  30 tablet  3  . glipiZIDE (GLUCOTROL) 5 MG tablet Take 5 mg by mouth 2 (two) times daily before a meal. Unsure of dose, only takes as needed      . glucose blood (BAYER CONTOUR NEXT TEST) test strip Use as instructed  100 each  12  . hydrALAZINE (APRESOLINE) 100 MG tablet Take 1.5 tablets (150 mg total) by mouth 4 (four) times daily.  135 tablet  6  . HYDROcodone-acetaminophen (LORTAB) 10-500 MG per tablet Take 1 tablet by mouth every 6 (six) hours as needed. For pain  120 tablet  2  . isosorbide mononitrate (IMDUR) 60 MG 24 hr tablet Takes 2 tablets twice a day.      . Sod Citrate-Citric Acid (CYTRA-2) 500-334 MG/5ML SOLN Two Table spoons twice a day.      . warfarin (COUMADIN) 5 MG tablet Take 5 mg by mouth daily. One and a half tablets on Friday.  Adjust dose for goal INR 2.0-3.0.  Take as directed by anticoagulation clinic      . DISCONTD: warfarin  (COUMADIN) 5 MG tablet Take 1 tablet (5 mg total) by mouth daily. Adjust dose for goal INR 2.0-3.0.  Take as directed by anticoagulation clinic  35 tablet  3  . mirtazapine (REMERON) 7.5 mg TABS Take 0.5 tablets (7.5 mg total) by mouth at bedtime. Increase after one week to whole tablet  45 tablet  1  . ondansetron (ZOFRAN-ODT) 4 MG disintegrating tablet Take 1 tablet (4 mg total) by mouth every 8 (eight) hours as needed for nausea.  30 tablet  3     No orders of the defined types were placed in this encounter.    No Follow-up on file.

## 2012-01-12 ENCOUNTER — Encounter: Payer: Self-pay | Admitting: Internal Medicine

## 2012-01-12 DIAGNOSIS — R5381 Other malaise: Secondary | ICD-10-CM | POA: Insufficient documentation

## 2012-01-12 NOTE — Assessment & Plan Note (Signed)
Now clinically resolved. I do not plan to repeat the chest x-ray to prove that the pneumonia has resolved. I have written a letter to Dr. Shea Evans stating that his pneumonia has clinically resolved and he may proceed with oral surgery.

## 2012-01-12 NOTE — Assessment & Plan Note (Signed)
With prior large embolic stroke. Daughter is worried about him being off of Coumadin for his oral I recommended that we use Lovenox as a bridge. When she has the oral surgery set up she will call me and I will give him instructions on when to stop the Coumadin.

## 2012-01-12 NOTE — Assessment & Plan Note (Signed)
Understandably multifactorial. He has anemia, chronic pain, a debilitating stroke, aortic stenosis, atrial fibrillation. I suggested that he may be depressed due to his multiple comorbidities and debilitation. Trial of mirtazapine. We will hold his diazepam that they're giving to him the evening for insomnia and start with 7.5 mg of mirtazapine. We'll increase the dose to 15 mg after one week if it is tolerated. Return one month

## 2012-01-12 NOTE — Assessment & Plan Note (Signed)
Daughter notes that his blood pressures have been labile because she has withheld all  medication for blood pressure.Marland Kitchen Resume her prior instructions.

## 2012-01-13 ENCOUNTER — Telehealth: Payer: Self-pay | Admitting: Internal Medicine

## 2012-01-13 NOTE — Telephone Encounter (Signed)
Scott Norman called her brother [name] (chris) received letter for [employer] jury duty this last 6 week or longer if called.  Scott Norman wanted to know if you could write a letter of medical deferral for her brother until they get things straightened out with mr Malen Gauze. Scott Norman has done a rough draft and will fax over to see if this is ok with dr Darrick Huntsman Please Francee Piccolo

## 2012-01-14 ENCOUNTER — Telehealth: Payer: Self-pay | Admitting: Pharmacist

## 2012-01-14 ENCOUNTER — Telehealth: Payer: Self-pay | Admitting: Internal Medicine

## 2012-01-14 NOTE — Telephone Encounter (Signed)
[name] came in today.  Mr Vint oral surgeon can get him in tueday  01/18/12 to pull is teeth.  [name] wanted to know if this was enough time to stop coumdian and start lovinox.  [name] also stated she need instructions on how to do the lovinox   Dosage and the frequency Please advise asap so she can confirm with oral sugeon

## 2012-01-14 NOTE — Telephone Encounter (Signed)
He needs 90 mg lovenox  Injected twice daily and stop the coumadin immediately (none today) .  He needs to be off of the lovenox for 12 hours prior to surgery. So they will need to start it at a time that is convenient and will cover him until the surgery, last hose at least 12 hours prior to time of surgery.

## 2012-01-14 NOTE — Telephone Encounter (Signed)
Scott Norman notified that Dr Darrick Huntsman will not be able to do the letter.

## 2012-01-14 NOTE — Telephone Encounter (Signed)
I spoke with patients daughter, she stated she did not hear from [place] so she called Dr. Windell Hummingbird office and they are taking care of his coumadin and Lovenox.

## 2012-01-14 NOTE — Telephone Encounter (Signed)
If Scott Norman is not involved in the daily care of his father,. I will not lie that he is to get him out of jury duty.  If he is sitting with him daily ,  That  is a different story

## 2012-01-14 NOTE — Telephone Encounter (Signed)
Patient's daughter called to report that patient is scheduled to have dental surgery to remove his bottom teeth on 8/20.  Per Dr. Darrick Huntsman, pt will need Lovenox bridge.  He already has some of the Lovenox 100mg  syringes at home and has been previously set up with bridging for knee surgeries (note: these were all canceled).  Daughter states caregiver can give the injections.  Most recent SCr- 1.88  8/16- No Coumadin today 8/17- Lovenox 100mg  in AM  8/18- Lovenox 100mg  in AM  8/19- Lovenox 100mg  in AM  8/20- Procedure  8/21- Lovenox 100mg  in AM and Coumadin 7.5mg   8/22- Lovenox 100mg  in AM and Coumadin 7.5mg   8/23- Lovenox 100mg  in AM and Coumadin 7.5mg   8/24- Lovenox 100mg  in AM and Coumadin 5mg   8/25- Lovenox 100mg  in AM and Coumadin 5mg   8/26- Recheck INR.  Appt made in the [facility]

## 2012-01-17 ENCOUNTER — Ambulatory Visit (INDEPENDENT_AMBULATORY_CARE_PROVIDER_SITE_OTHER): Payer: Medicare Other | Admitting: Cardiovascular Disease

## 2012-01-17 ENCOUNTER — Ambulatory Visit: Payer: Medicare Other | Admitting: Cardiovascular Disease

## 2012-01-17 ENCOUNTER — Encounter: Payer: Self-pay | Admitting: Cardiovascular Disease

## 2012-01-17 VITALS — BP 120/50 | HR 64 | Ht 72.0 in | Wt 208.0 lb

## 2012-01-17 DIAGNOSIS — I359 Nonrheumatic aortic valve disorder, unspecified: Secondary | ICD-10-CM

## 2012-01-17 DIAGNOSIS — I509 Heart failure, unspecified: Secondary | ICD-10-CM

## 2012-01-17 DIAGNOSIS — E1129 Type 2 diabetes mellitus with other diabetic kidney complication: Secondary | ICD-10-CM

## 2012-01-17 DIAGNOSIS — I10 Essential (primary) hypertension: Secondary | ICD-10-CM

## 2012-01-17 DIAGNOSIS — E1121 Type 2 diabetes mellitus with diabetic nephropathy: Secondary | ICD-10-CM

## 2012-01-17 DIAGNOSIS — I350 Nonrheumatic aortic (valve) stenosis: Secondary | ICD-10-CM

## 2012-01-17 DIAGNOSIS — I5033 Acute on chronic diastolic (congestive) heart failure: Secondary | ICD-10-CM

## 2012-01-17 DIAGNOSIS — N058 Unspecified nephritic syndrome with other morphologic changes: Secondary | ICD-10-CM

## 2012-01-17 MED ORDER — ISOSORBIDE MONONITRATE ER 60 MG PO TB24: 120.0000 mg | ORAL_TABLET | Freq: Every evening | ORAL | Status: DC

## 2012-01-17 NOTE — Assessment & Plan Note (Signed)
He reports sugar level has been much improved after weight loss. He is also not eating as much.

## 2012-01-17 NOTE — Patient Instructions (Addendum)
Please start amlodipine 5 mg at noon Take imdur one pill at dinner  Take cardura at dinner Hold clonidine for now Take hydralazine up to every 6 hours as needed for pressure greater than 160 ok to take a second pill of imdur if needed for high pressure  Please call us if you have new issues that need to be addressed before your next appt.  Your physician wants you to follow-up in: 1 months.

## 2012-01-17 NOTE — Progress Notes (Signed)
Patient ID: Scott Norman, male    DOB: 01-Oct-1930, 78 y.o.   MRN: [id number]  HPI Comments:  78 yo with history of paroxysmal atrial fibrillation, CKD, and mild to moderate aortic stenosis, recent hospitalization for large cardioembolic stroke ([place] in 5/12 with a left MCA cardioembolic stroke),   treated with tPA, noted to be in atrial fibrillation  while in the hospital with residual profound expressive aphasia, now on coumadin,  with weight gain, worsening edema and abdominal swelling in 2012 , Severe hypertension, found to have renal artery stenosis, now status post stent placement. He presents for routine followup   his weight continues to trend downward, at least 25 pounds. He is  not eating. The stress from his wife has somewhat resolved as she has moved out. He takes Lasix Monday, Wednesday, Friday. His family has been helping him with blood pressure medications. Blood pressure has been labile. Periodically holding the clonidine. Blood pressure in the morning is low, high in the afternoon and even higher overnight.   He is scheduled to have numerous teeth removed on his bottom jaw, some on the top, tomorrow  He had a recent pneumonia and had 2 courses of antibiotics with associated diarrhea, in July. Occasional cough He is taking MiraLAX for constipation. He does report having left flank pain, possibly from coughing.     Outpatient Encounter Prescriptions as of 01/17/2012  Medication Sig Dispense Refill  . amLODipine (NORVASC) 5 MG tablet Take 2.5 mg by mouth daily.       . cloNIDine (CATAPRES) 0.3 MG tablet Take 0.3 mg by mouth 3 (three) times daily.       . diazepam (VALIUM) 5 MG tablet Take 0.5 tablets (2.5 mg total) by mouth 2 (two) times daily as needed.  30 tablet  2  . docusate sodium (COLACE) 100 MG capsule Take 100 mg by mouth 3 (three) times daily as needed. For stool softner      . doxazosin (CARDURA) 4 MG tablet Take 1 tablet (4 mg total) by mouth at bedtime.  30  tablet  3  . fenofibrate (TRICOR) 145 MG tablet Take 1 tablet (145 mg total) by mouth daily.  30 tablet  6  . furosemide (LASIX) 40 MG tablet Take 0.5 tablets (20 mg total) by mouth 3 (three) times a week.  30 tablet  3  . glipiZIDE (GLUCOTROL) 5 MG tablet Take 5 mg by mouth 2 (two) times daily before a meal. Unsure of dose, only takes as needed      . glucose blood (BAYER CONTOUR NEXT TEST) test strip Use as instructed  100 each  12  . hydrALAZINE (APRESOLINE) 100 MG tablet Take 1.5 tablets (150 mg total) by mouth 4 (four) times daily.  135 tablet  6  . isosorbide mononitrate (IMDUR) 60 MG 24 hr tablet Takes 2 tablets twice a day.      . mirtazapine (REMERON) 7.5 mg TABS Take 0.5 tablets (7.5 mg total) by mouth at bedtime. Increase after one week to whole tablet  45 tablet  1  . ondansetron (ZOFRAN-ODT) 4 MG disintegrating tablet Take 1 tablet (4 mg total) by mouth every 8 (eight) hours as needed for nausea.  30 tablet  3  . Sod Citrate-Citric Acid (CYTRA-2) 500-334 MG/5ML SOLN Two Table spoons twice a day.      . warfarin (COUMADIN) 5 MG tablet Take 5 mg by mouth daily. One and a half tablets on Friday.  Adjust dose for goal  INR 2.0-3.0.  Take as directed by anticoagulation clinic      . HYDROcodone-acetaminophen (LORTAB) 10-500 MG per tablet Take 1 tablet by mouth every 6 (six) hours as needed. For pain  120 tablet  2      Review of Systems  Constitutional: Positive for unexpected weight change.  HENT: Negative.   Eyes: Negative.   Respiratory: Negative.   Cardiovascular: Negative.   Gastrointestinal: Negative.   Musculoskeletal: Positive for gait problem.       Left flank pain  Skin: Negative.   Neurological: Negative.        Speech difficulty  Hematological: Negative.   Psychiatric/Behavioral: Negative.   All other systems reviewed and are negative.   BP 120/50  Pulse 64  Ht 6' (1.829 m)  Wt 208 lb (94.348 kg)  BMI 28.21 kg/m2  Physical Exam  Nursing note and vitals  reviewed. Constitutional: He is oriented to person, place, and time. He appears well-developed and well-nourished.  HENT:  Head: Normocephalic.  Nose: Nose normal.  Mouth/Throat: Oropharynx is clear and moist.  Eyes: Conjunctivae are normal. Pupils are equal, round, and reactive to light.  Neck: Normal range of motion. Neck supple. No JVD present. Carotid bruit is present.  Cardiovascular: Normal rate, S1 normal, S2 normal and intact distal pulses.  An irregularly irregular rhythm present.  Occasional extrasystoles are present. Exam reveals no gallop and no friction rub.   Murmur heard.  Crescendo systolic murmur is present with a grade of 2/6  Pulmonary/Chest: Effort normal and breath sounds normal. No respiratory distress. He has no wheezes. He has no rales. He exhibits no tenderness.  Abdominal: Soft. Bowel sounds are normal. He exhibits no distension. There is no tenderness.  Musculoskeletal: Normal range of motion. He exhibits no edema and no tenderness.  Lymphadenopathy:    He has no cervical adenopathy.  Neurological: He is alert and oriented to person, place, and time. Coordination normal.  Skin: Skin is warm and dry. No rash noted. No erythema.  Psychiatric: He has a normal mood and affect. His behavior is normal. Judgment and thought content normal.           Assessment and Plan

## 2012-01-17 NOTE — Assessment & Plan Note (Signed)
This may be contributing to labile blood pressures, particularly if he has any dehydration. Will monitor periodically with echocardiogram.

## 2012-01-17 NOTE — Assessment & Plan Note (Signed)
Family is holding many of his medications as blood pressure is labile. We have suggested he take amlodipine 5 mg at noon, isosorbide 60 mg at dinner, Cardura at dinner. Hold the clonidine as this could be contributing to labile pressures. He can take hydralazine every 6 hours as needed for hypertension. His blood pressure continues to be high, we have suggested he take additional isosorbide.

## 2012-01-17 NOTE — Assessment & Plan Note (Signed)
He is taking Lasix 3 times per week. No edema on today's visit. Family reports they have encouraged fluid intake. Appears to be euvolemic.

## 2012-01-18 ENCOUNTER — Telehealth: Payer: Self-pay | Admitting: Cardiovascular Disease

## 2012-01-18 NOTE — Telephone Encounter (Signed)
Pt daughter calling with questions concerning his BP meds.

## 2012-01-18 NOTE — Telephone Encounter (Signed)
Scott Norman daughter is concerned because his bp was elevated this am prior to his dental surgery.  She is not sure if it is because of the medication change yesterday or if he was nervous about the dental surgery that he had at noon.  She gave him Hydralazine 150mg , Isosorbide 60mg  and Amlodipine 5mg  this am at 5:30am and at 8:30am his BP was 214/68.  This is the first time she has checked his bp since the med change.  She wants to know if it is ok to give him extra of any of these meds if he bp stay elevated?

## 2012-01-19 NOTE — Telephone Encounter (Signed)
probably need more numbers now that he has had all his teeth pulled. On pain pills? Sometimes this drops pressures Would try to back end load most of his meds at noon, dinner and before bed Always high in AM

## 2012-01-20 NOTE — Telephone Encounter (Signed)
Pt daughter called stating that pt BP is still running High. Wants to know what they should do. Daughter is going to fax readings to office

## 2012-01-20 NOTE — Telephone Encounter (Signed)
Discussed with Dr. Mariah Milling, who asks that we try.Marland KitchenMarland Kitchen 1) increase Imdur to 2 tablets BID, if BP still elevated... 2)increase noon dose of amlodipine to 10 mg qd, if BP still elevated... 3)may try increasing cardura to 4 mg BID

## 2012-01-20 NOTE — Telephone Encounter (Signed)
List of BPs received 8/21 0500-187/64 0900-185/63 1478-295/62 1105-159/58 1308-657/84 1605-177/63 1800-176/65 2100-203/73 2300-201/76 8/22 6962-952/84 1324-401/02 0900-182/66  Taking meds as prescribed. Will discuss with Dr. Mariah Milling and call dtr back at [phone]

## 2012-01-20 NOTE — Telephone Encounter (Signed)
Consuella Lose, pt's caregiver, informed.  Understanding verbalized. She will monitor pt's BP and keep [place] informed.

## 2012-01-21 ENCOUNTER — Telehealth: Payer: Self-pay | Admitting: Cardiovascular Disease

## 2012-01-21 DIAGNOSIS — I701 Atherosclerosis of renal artery: Secondary | ICD-10-CM

## 2012-01-21 DIAGNOSIS — I350 Nonrheumatic aortic (valve) stenosis: Secondary | ICD-10-CM

## 2012-01-21 DIAGNOSIS — I10 Essential (primary) hypertension: Secondary | ICD-10-CM

## 2012-01-21 NOTE — Telephone Encounter (Signed)
[name]- the patient's caregiver called and would like for Melissa to call her back regarding Mr. Kocsis her number is [phone]

## 2012-01-21 NOTE — Telephone Encounter (Signed)
Notified daughter [name]) per Dr. Mariah Milling continue to follow the blood pressure medication instructions that was given to them by phone on 01/20/2012. Told the daughter to not give clonidine at all before contacting the office first. Told the daughter not to have him on the exercise bike if blood pressure too low, patient should be lying flat. The daughter understands and will contact someone if continues to have these blood pressure readings.

## 2012-01-21 NOTE — Telephone Encounter (Signed)
Blood pressure yesterday pm186/?  Gave two isosorbide and one and half hydralazine @ 11:00 pm, 11:30 pm BP 212/77- HR 64 patient was given the max of all medications @ 11:45 pm gave a clonidine BP 1:30 am 150/60 HR 68, 5:00 am 160/58 HR 54, gave hydralazine 1 1/2, isosorbide x 2, 9:00 am 176/65 HR 58, gave amlodipine 10 mg, @ 10 am BP- 187/60 HR 57, gave clonidine @ 10 am, 11:12 am 124/43 HR 57, 11:30 am 78/41 HR 66, patient's daughter told patient to get on exercise bike and drake a gatorade and blood pressure 107/41 HR 62, 1:00 pm 115/42, @ 1:30 78/37, @ 1:45  blood pressure 113/41, patient is feeling fine.

## 2012-01-21 NOTE — Telephone Encounter (Signed)
Per Dr. Mariah Milling patient needs to have a renal artery duplex. Patient is scheduled for renal artery doppler for Sept 10, 2013 @ 9:30.

## 2012-01-24 ENCOUNTER — Ambulatory Visit (INDEPENDENT_AMBULATORY_CARE_PROVIDER_SITE_OTHER): Payer: Medicare Other | Admitting: Cardiovascular Disease

## 2012-01-24 ENCOUNTER — Encounter (INDEPENDENT_AMBULATORY_CARE_PROVIDER_SITE_OTHER): Payer: Medicare Other

## 2012-01-24 DIAGNOSIS — I6350 Cerebral infarction due to unspecified occlusion or stenosis of unspecified cerebral artery: Secondary | ICD-10-CM

## 2012-01-24 DIAGNOSIS — Z7901 Long term (current) use of anticoagulants: Secondary | ICD-10-CM

## 2012-01-24 DIAGNOSIS — I639 Cerebral infarction, unspecified: Secondary | ICD-10-CM

## 2012-01-24 DIAGNOSIS — I4891 Unspecified atrial fibrillation: Secondary | ICD-10-CM

## 2012-01-24 LAB — POCT INR: INR: 2.1

## 2012-01-26 ENCOUNTER — Encounter (INDEPENDENT_AMBULATORY_CARE_PROVIDER_SITE_OTHER): Payer: Medicare Other

## 2012-01-26 DIAGNOSIS — I10 Essential (primary) hypertension: Secondary | ICD-10-CM

## 2012-01-26 DIAGNOSIS — I701 Atherosclerosis of renal artery: Secondary | ICD-10-CM

## 2012-01-28 ENCOUNTER — Other Ambulatory Visit: Payer: Self-pay

## 2012-01-28 MED ORDER — ISOSORBIDE MONONITRATE ER 60 MG PO TB24: ORAL_TABLET | ORAL | Status: DC

## 2012-01-28 MED ORDER — AMLODIPINE BESYLATE 5 MG PO TABS: ORAL_TABLET | ORAL | Status: DC

## 2012-01-28 NOTE — Telephone Encounter (Signed)
He is already on Valium. Should follow up with PCP about anxiety. Schedule a follow up with Dr. Mariah Milling to address the BP.

## 2012-01-28 NOTE — Telephone Encounter (Signed)
Pt's dtr faxed BP readings from 01/26/12, 01/27/12. She voices concern b/c most high BP readings are at hs, when pt is at home alone.  She wonders if he would benefit from a med to help him sleep/be less anxious. I asked why some of the readings are at midnight, 0200, 0400. She says pt is awakened by "not feeling well" at these times and he checks his BPs. Bp's as follows: 8/28 0620-181/65 4540-981/19 1800-229/75-alone 1478-295/62 8/29 1308-657/84 6962-952/84 1324-401/02 I told dtr I would ask MD if pt would benefit from an anti-anxiety med at hs. It may be that MD wants pt to f/u with PCP about this.  I will try to call her back today. Understanding verb.

## 2012-01-28 NOTE — Telephone Encounter (Signed)
I discussed with dtr, [name]. She says he has been off the valium for a while but she tried giving him 1/2 tablet again last night without any improvement. I advised ok to try 1 whole tablet at hs PRN anxiety, per Dr. Kirke Corin.  He should f/u with PCP for anxiety if this doesn't help.  She verb. Understanding and will monitor BP over w/e and will bring readings to echo appt 9/3.

## 2012-01-28 NOTE — Telephone Encounter (Signed)
Can you advise?  Gollan pt

## 2012-01-30 ENCOUNTER — Ambulatory Visit: Payer: Self-pay | Admitting: Oncology

## 2012-02-01 ENCOUNTER — Other Ambulatory Visit (INDEPENDENT_AMBULATORY_CARE_PROVIDER_SITE_OTHER): Payer: Medicare Other

## 2012-02-01 DIAGNOSIS — R011 Cardiac murmur, unspecified: Secondary | ICD-10-CM

## 2012-02-02 ENCOUNTER — Encounter: Payer: Self-pay | Admitting: Cardiovascular Disease

## 2012-02-03 ENCOUNTER — Ambulatory Visit (INDEPENDENT_AMBULATORY_CARE_PROVIDER_SITE_OTHER): Payer: Medicare Other | Admitting: Cardiovascular Disease

## 2012-02-03 ENCOUNTER — Encounter: Payer: Self-pay | Admitting: Cardiovascular Disease

## 2012-02-03 VITALS — BP 152/60 | HR 68 | Ht 72.0 in | Wt 202.5 lb

## 2012-02-03 DIAGNOSIS — I509 Heart failure, unspecified: Secondary | ICD-10-CM

## 2012-02-03 DIAGNOSIS — I5033 Acute on chronic diastolic (congestive) heart failure: Secondary | ICD-10-CM

## 2012-02-03 DIAGNOSIS — I359 Nonrheumatic aortic valve disorder, unspecified: Secondary | ICD-10-CM

## 2012-02-03 DIAGNOSIS — I701 Atherosclerosis of renal artery: Secondary | ICD-10-CM

## 2012-02-03 DIAGNOSIS — I10 Essential (primary) hypertension: Secondary | ICD-10-CM

## 2012-02-03 DIAGNOSIS — I350 Nonrheumatic aortic (valve) stenosis: Secondary | ICD-10-CM

## 2012-02-03 DIAGNOSIS — E785 Hyperlipidemia, unspecified: Secondary | ICD-10-CM

## 2012-02-03 MED ORDER — LABETALOL HCL 100 MG PO TABS: 100.0000 mg | ORAL_TABLET | Freq: Two times a day (BID) | ORAL | Status: DC | PRN

## 2012-02-03 MED ORDER — NITROGLYCERIN 0.4 MG SL SUBL: 0.4000 mg | SUBLINGUAL_TABLET | SUBLINGUAL | Status: DC | PRN

## 2012-02-03 NOTE — Assessment & Plan Note (Addendum)
Recent echocardiogram shows mildly elevated right ventricular systolic pressures. His family is concerned that he could be dehydrated. I suggested they decreased the Lasix to twice a week with extra dosing for edema.

## 2012-02-03 NOTE — Progress Notes (Signed)
Patient ID: Scott Norman, male    DOB: [birth year], 78 y.o.   MRN: [id number]  HPI Comments: 78 yo with history of paroxysmal atrial fibrillation, CKD, and mild to moderate aortic stenosis, recent hospitalization for large cardioembolic stroke ([place] in 5/12 with a left MCA cardioembolic stroke),   treated with tPA, noted to be in atrial fibrillation  while in the hospital with residual profound expressive aphasia, now on coumadin,  with weight gain, worsening edema and abdominal swelling in 2012 , Severe hypertension, found to have renal artery stenosis, now status post stent placement. He presents for routine followup   his weight continues to trend downward. He is  not eating much. The stress from his wife has somewhat resolved as she has moved out. He takes Lasix Monday, Wednesday, Friday. His family has been helping him with blood pressure medications. Blood pressure has been labile. Now holding the clonidine. Blood pressure continues to be low in the morning, high in the afternoon and even higher overnight.   Scheduled to have right knee surgery, replacement  His family thinks that he is very anxious at nighttime when the caretaker leads. He checks his blood pressure very frequently and becomes very nervous about his numbers. They do not think the Valium is helping him to calm down if they take a high dose, he is very lethargic in the morning.  Blood pressures at home continues to be labile with measurements ranging from 190 systolic in the evenings, sometimes 110 or less in the late morning.      Outpatient Encounter Prescriptions as of 02/03/2012  Medication Sig Dispense Refill  . amLODipine (NORVASC) 5 MG tablet Take 1 tablet daily  as directed  45 tablet  3  . diazepam (VALIUM) 5 MG tablet Take 0.5 tablets (2.5 mg total) by mouth 2 (two) times daily as needed.  30 tablet  2  . docusate sodium (COLACE) 100 MG capsule Take 100 mg by mouth 3 (three) times daily as needed. For stool  softner      . doxazosin (CARDURA) 4 MG tablet Take 1 tablet (4 mg total) by mouth at bedtime.  30 tablet  3  . fenofibrate (TRICOR) 145 MG tablet Take 1 tablet (145 mg total) by mouth daily.  30 tablet  6  . furosemide (LASIX) 40 MG tablet Take 0.5 tablets (20 mg total) by mouth 3 (three) times a week.  30 tablet  3  . glipiZIDE (GLUCOTROL) 5 MG tablet Take 5 mg by mouth 2 (two) times daily before a meal. Unsure of dose, only takes as needed      . glucose blood (BAYER CONTOUR NEXT TEST) test strip Use as instructed  100 each  12  . hydrALAZINE (APRESOLINE) 100 MG tablet Take 1.5 tablets (150 mg total) by mouth 4 (four) times daily.  135 tablet  6  . HYDROcodone-acetaminophen (LORTAB) 10-500 MG per tablet Take 1 tablet by mouth every 6 (six) hours as needed. For pain  120 tablet  2  . isosorbide mononitrate (IMDUR) 60 MG 24 hr tablet Take 2 tablets twice daily  120 tablet  6  . mirtazapine (REMERON) 7.5 mg TABS Take 0.5 tablets (7.5 mg total) by mouth at bedtime. Increase after one week to whole tablet  45 tablet  1  . Sod Citrate-Citric Acid (CYTRA-2) 500-334 MG/5ML SOLN Two Table spoons twice a day.      . warfarin (COUMADIN) 5 MG tablet Take 5 mg by mouth daily. One  and a half tablets on Friday.  Adjust dose for goal INR 2.0-3.0.  Take as directed by anticoagulation clinic      . labetalol (NORMODYNE) 100 MG tablet Take 1 tablet (100 mg total) by mouth 2 (two) times daily as needed.  60 tablet  6  . nitroGLYCERIN (NITROSTAT) 0.4 MG SL tablet Place 1 tablet (0.4 mg total) under the tongue every 5 (five) minutes as needed for chest pain.  25 tablet  6      Review of Systems  Constitutional: Positive for unexpected weight change.  HENT: Negative.   Eyes: Negative.   Respiratory: Negative.   Cardiovascular: Negative.   Gastrointestinal: Negative.   Musculoskeletal: Positive for gait problem.  Skin: Negative.   Neurological: Negative.        Speech difficulty  Hematological: Negative.     Psychiatric/Behavioral: Negative.   All other systems reviewed and are negative.   BP 152/60  Pulse 68  Ht 6' (1.829 m)  Wt 202 lb 8 oz (91.853 kg)  BMI 27.46 kg/m2  Physical Exam  Nursing note and vitals reviewed. Constitutional: He is oriented to person, place, and time. He appears well-developed and well-nourished.  HENT:  Head: Normocephalic.  Nose: Nose normal.  Mouth/Throat: Oropharynx is clear and moist.  Eyes: Conjunctivae are normal. Pupils are equal, round, and reactive to light.  Neck: Normal range of motion. Neck supple. No JVD present. Carotid bruit is present.  Cardiovascular: Normal rate, S1 normal, S2 normal and intact distal pulses.  An irregularly irregular rhythm present.  Occasional extrasystoles are present. Exam reveals no gallop and no friction rub.   Murmur heard.  Crescendo systolic murmur is present with a grade of 2/6  Pulmonary/Chest: Effort normal and breath sounds normal. No respiratory distress. He has no wheezes. He has no rales. He exhibits no tenderness.  Abdominal: Soft. Bowel sounds are normal. He exhibits no distension. There is no tenderness.  Musculoskeletal: Normal range of motion. He exhibits no edema and no tenderness.  Lymphadenopathy:    He has no cervical adenopathy.  Neurological: He is alert and oriented to person, place, and time. Coordination normal.  Skin: Skin is warm and dry. No rash noted. No erythema.  Psychiatric: He has a normal mood and affect. His behavior is normal. Judgment and thought content normal.           Assessment and Plan

## 2012-02-03 NOTE — Patient Instructions (Addendum)
Please try nitroglycerin  Ok to try labetolol for blood pressure, no more than twice a day  Please call us if you have new issues that need to be addressed before your next appt.  Your physician wants you to follow-up in: 3 months.  You will receive a reminder letter in the mail two months in advance. If you don't receive a letter, please call our office to schedule the follow-up appointment.

## 2012-02-03 NOTE — Assessment & Plan Note (Addendum)
Continues to have labile pressures. I suspect some of this could be secondary to anxiety. He may benefit from shorter acting benzodiazepine in the evening. He does have side effects such as excessive somnolence in the morning on Valium. I suggested he try half dose sublingual nitroglycerin for severe hypertension. He could also try labetalol as needed in the afternoon/evening.

## 2012-02-03 NOTE — Assessment & Plan Note (Signed)
Moderate aortic valve stenosis on recent echo. We'll monitor for now.

## 2012-02-03 NOTE — Assessment & Plan Note (Signed)
Recent renal artery ultrasound showing patent stent, not contributing to labile pressures.

## 2012-02-03 NOTE — Assessment & Plan Note (Signed)
Currently on fenofibrate. 

## 2012-02-04 ENCOUNTER — Other Ambulatory Visit: Payer: Self-pay | Admitting: Internal Medicine

## 2012-02-04 MED ORDER — ALPRAZOLAM 1 MG PO TABS: ORAL_TABLET | ORAL | Status: DC

## 2012-02-09 ENCOUNTER — Ambulatory Visit (INDEPENDENT_AMBULATORY_CARE_PROVIDER_SITE_OTHER): Payer: Medicare Other

## 2012-02-09 ENCOUNTER — Telehealth: Payer: Self-pay | Admitting: Internal Medicine

## 2012-02-09 DIAGNOSIS — I6350 Cerebral infarction due to unspecified occlusion or stenosis of unspecified cerebral artery: Secondary | ICD-10-CM

## 2012-02-09 DIAGNOSIS — I639 Cerebral infarction, unspecified: Secondary | ICD-10-CM

## 2012-02-09 DIAGNOSIS — Z7901 Long term (current) use of anticoagulants: Secondary | ICD-10-CM

## 2012-02-09 DIAGNOSIS — I4891 Unspecified atrial fibrillation: Secondary | ICD-10-CM

## 2012-02-09 DIAGNOSIS — Z79899 Other long term (current) drug therapy: Secondary | ICD-10-CM

## 2012-02-09 DIAGNOSIS — Z8701 Personal history of pneumonia (recurrent): Secondary | ICD-10-CM

## 2012-02-09 NOTE — Telephone Encounter (Signed)
P[lease have him go to [facility] for the x ray,  Here for the labs.  [facility] Noe did not say what labs orthopedics wanted  So if CMET and CBC is not enough they will have to be mopres specific

## 2012-02-09 NOTE — Telephone Encounter (Signed)
Patient is needing a repeat chest x-ray to assure that his pneumonia is clear for medical clearance before his knee replacement surgery . He also needs more recent lab work done. This information needs to go to the attention of Dr. Thurston Hole at [facility] and [facility] orthopedics # [phone].

## 2012-02-09 NOTE — Telephone Encounter (Signed)
Patient is coming here tomorrow morning for the CMET and CBC after leaving here he will go to [facility] for chest xray. I spoke with his daughter.

## 2012-02-10 ENCOUNTER — Ambulatory Visit (INDEPENDENT_AMBULATORY_CARE_PROVIDER_SITE_OTHER)
Admission: RE | Admit: 2012-02-10 | Discharge: 2012-02-10 | Disposition: A | Discharge status: Home / Self Care | Payer: Medicare Other | Source: Ambulatory Visit | Attending: Internal Medicine | Admitting: Internal Medicine

## 2012-02-10 ENCOUNTER — Other Ambulatory Visit (INDEPENDENT_AMBULATORY_CARE_PROVIDER_SITE_OTHER): Payer: Medicare Other | Admitting: *Deleted

## 2012-02-10 DIAGNOSIS — Z8701 Personal history of pneumonia (recurrent): Secondary | ICD-10-CM

## 2012-02-10 DIAGNOSIS — Z01818 Encounter for other preprocedural examination: Secondary | ICD-10-CM

## 2012-02-10 LAB — CBC WITH DIFFERENTIAL/PLATELET
Basophils Relative: 0.4 % (ref 0.0–3.0)
Eosinophils Absolute: 0.1 10*3/uL (ref 0.0–0.7)
Eosinophils Relative: 1.5 % (ref 0.0–5.0)
Lymphocytes Relative: 21.4 % (ref 12.0–46.0)
Monocytes Relative: 8.3 % (ref 3.0–12.0)
Neutrophils Relative %: 68.4 % (ref 43.0–77.0)
RBC: 4.14 Mil/uL — ABNORMAL LOW (ref 4.22–5.81)
WBC: 7.7 10*3/uL (ref 4.5–10.5)

## 2012-02-11 ENCOUNTER — Other Ambulatory Visit: Payer: Self-pay | Admitting: Internal Medicine

## 2012-02-11 ENCOUNTER — Telehealth: Payer: Self-pay | Admitting: *Deleted

## 2012-02-11 ENCOUNTER — Telehealth: Payer: Self-pay | Admitting: Internal Medicine

## 2012-02-11 LAB — COMPREHENSIVE METABOLIC PANEL
Albumin: 3.5 g/dL (ref 3.5–5.2)
BUN: 51 mg/dL — ABNORMAL HIGH (ref 6–23)
Calcium: 9.6 mg/dL (ref 8.4–10.5)
Chloride: 110 mEq/L (ref 96–112)
GFR: 30.02 mL/min — ABNORMAL LOW (ref >60.00)
Glucose, Bld: 130 mg/dL — ABNORMAL HIGH (ref 70–99)
Potassium: 5.5 mEq/L — ABNORMAL HIGH (ref 3.5–5.1)

## 2012-02-11 MED ORDER — POTASSIUM CHLORIDE 20 MEQ/15ML (10%) PO LIQD: 30.0000 meq | Freq: Every day | ORAL | Status: DC

## 2012-02-11 MED ORDER — OXYCODONE-ACETAMINOPHEN 5-325 MG PO TABS: 1.0000 | ORAL_TABLET | Freq: Three times a day (TID) | ORAL | Status: DC | PRN

## 2012-02-11 NOTE — Telephone Encounter (Signed)
Spoke with Consuella Lose and she will have someone pick up the prescription

## 2012-02-11 NOTE — Telephone Encounter (Signed)
Scott Norman called to get the xray results that Scott Norman had done yesterday at [facility] Please advise

## 2012-02-11 NOTE — Telephone Encounter (Signed)
Spoke with pt's daughter. She states that pt was seen by Dr Darrick Huntsman on 02-09-12 and had cxr at that time. She states was told that the PNA has improved but not yet resolved. She is asking for you to please look at the films and advise what you think. Symptom wise he has improved some, but still has a lot of fatigue. Please advise, thanks!

## 2012-02-11 NOTE — Telephone Encounter (Signed)
Pt's daughter returned Leslie's call.  Antionette Fairy

## 2012-02-11 NOTE — Telephone Encounter (Signed)
Yes, we can try percocet.  She will have to come by to pick up rx.

## 2012-02-11 NOTE — Telephone Encounter (Signed)
Message copied by Christen Butter on Fri Feb 11, 2012  5:03 PM ------      Message from: Sandrea Hughs B      Created: Mon Dec 27, 2011 12:42 PM       cxr by now due

## 2012-02-11 NOTE — Telephone Encounter (Signed)
LMTCB

## 2012-02-11 NOTE — Telephone Encounter (Signed)
Message copied by Trenton Gammon on Fri Feb 11, 2012  4:12 PM ------      Message from: Duncan Dull      Created: Fri Feb 11, 2012 12:48 PM       His blood work suggests that he is dehydrated on his current dose of furosemide. I would like him to stop it completely except for when his  weight increases by 2 pounds overnight. His potassium is also a little high. Make sure he is now taking a potassium supplement. If he is not we will need to call him in a medication called Kayexelate and he used to take 30 cc by mouth once daily. This will work to lower the potassium through his bowel movements,  he annually repeat basic metabolic panel on Monday or Tuesday of next week.

## 2012-02-11 NOTE — Telephone Encounter (Signed)
Patient is aware of lab and x-ray results.  Rx for potassium sent to pharmacy. Lab appointment made. E-copies sent to Dr Wyline Mood.  Consuella Lose would like to know if there is any other Rx to take for his knee pain?

## 2012-02-12 NOTE — Telephone Encounter (Signed)
cxr is definitely improved but not clear - in a never smoker this is statistically not likely to be anything but scar from aspiraton injury for which he is at marked increased risk.  If feeling better ok with me to go ahead with surgery and return here post op for f/u.  Alternative is to return here in October to regroup if not 100% improved symptom wise

## 2012-02-14 ENCOUNTER — Ambulatory Visit (INDEPENDENT_AMBULATORY_CARE_PROVIDER_SITE_OTHER): Payer: Medicare Other | Admitting: Internal Medicine

## 2012-02-14 ENCOUNTER — Encounter: Payer: Self-pay | Admitting: Internal Medicine

## 2012-02-14 VITALS — BP 200/56 | HR 58 | Temp 99.0°F | Resp 14 | Wt 204.8 lb

## 2012-02-14 DIAGNOSIS — M25569 Pain in unspecified knee: Secondary | ICD-10-CM

## 2012-02-14 DIAGNOSIS — M25561 Pain in right knee: Secondary | ICD-10-CM

## 2012-02-14 DIAGNOSIS — I10 Essential (primary) hypertension: Secondary | ICD-10-CM

## 2012-02-14 DIAGNOSIS — N189 Chronic kidney disease, unspecified: Secondary | ICD-10-CM

## 2012-02-14 NOTE — Telephone Encounter (Signed)
I spoke with daughter and is aware of this. She scheduled pt an appt to see MW on 19/18/13 at 11:00. She asked that I fax this note over to dr. Thurston Hole at [phone]. I have done so. Nothing further was needed

## 2012-02-14 NOTE — Assessment & Plan Note (Signed)
I have increased his Percocet use to 4 times daily. His steroid injection was not effective at all relieving his pain. He is evaluating total knee replacement.

## 2012-02-14 NOTE — Assessment & Plan Note (Addendum)
Labile aggravated by pain and anxiety, , thereby aortic stenosis. He has been evaluated by for renal artery stenosis ultrasound is not currently available. We made no other adjustments other than today to decrease his amlodipine to 2.5 mg in appetite have been take it twice daily. This was done because apparently the 5 mg of amlodipine has occasionally causes blood pressure dropped to a systolic of 100 which makes her feel very dizzy. I personally think that his blood pressures will not be controlled until we get his knee surgery underway and and manages his severe knee pain.

## 2012-02-14 NOTE — Progress Notes (Signed)
Patient ID: Scott Norman, male   DOB: 1931/02/22, 78 y.o.   MRN: [id number]  Patient Active Problem List  Diagnosis  . Atrial fibrillation  . Aortic stenosis  . Hyperlipidemia  . Hypertension  . Bradycardia  . CKD (chronic kidney disease)  . Encounter for long-term (current) use of anticoagulants  . CVA (cerebrovascular accident)  . Atrial flutter  . Type II diabetes mellitus with nephropathy  . Right renal artery stenosis  . Valvular cardiomyopathy  . Screening for osteoporosis  . Knee pain, right  . Acute on chronic diastolic CHF (congestive heart failure)  . Pneumonia, organism unspecified  . Iron deficiency anemia  . Dysphagia as late effect of stroke  . Malaise    Subjective:  CC:   Chief Complaint  Patient presents with  . Follow-up    HPI:   Scott BRANNIGAN a 78 y.o. male who presents Follow up on hypertension, anxiety and knee pain. He continues to have very labile blood pressures with systolics as high as 200 and followed by drops to 100 which made him to dizzy. His daughter has been adjusting his medications on a daily basis and reacting to blood pressures every 6 hours..  Since stopping the  5 am blood pressure medication (rec'd to stop by [facility]) his morning bps have been elevated to 200..  this morning he for a systolic of 200 he took a dose of nitroglycerin as recommended by Dr., and this brought his systolic down to 409.  however the 2 and half hours later his systolic was on the rise again and is currently 200. He is a consultative regimen of clonidine hydralazine, ,amlodipine, Imdur,  losartan and when necessary labetalol .  His pain is now well controlled on despite switch from Vicodin to Percocet 2 days ago. He is receiving 3 doses of Percocet daily. He had a steroid injection in the last week and one day later felt that he felt his knee was not bearing his weight well and was giving out on him. He was seen Dr. work for evaluation of abnormal chest x-ray which  is attributed to scar formation. He is awaiting normalization of his potassium level and return to baseline creatinine function which was all noted last Friday the neck. He has been taking Kayexalate daily as recommended by me and he has not had any furosemide in several days. He's increased his water intake.   Past Medical History  Diagnosis Date  . Diabetes mellitus   . Hypertension   . Degenerative disc disease, lumbar     back surgery 2011  . Gout   . Atrial fibrillation   . PVD (peripheral vascular disease)   . Aortic stenosis   . Hyperlipidemia   . Chronic kidney disease (CKD), stage III (moderate)   . H/O colonoscopy June 2007    normal  . Screening for osteoporosis Feb 2012    T -1.3 hip  . Stroke 2012    Rmbloic, with expressive aphasia  . Renal artery stenosis, native, bilateral     s/p right RA stent 2012, pending left   . Heart murmur   . Anxiety   . Sleep apnea     does not use CPAP    Past Surgical History  Procedure Date  . Shoulder surgery   . Lumbar spine surgery   . Renal artery stent 04/2011  . Tonsillectomy   . Appendectomy   . Throat surgery 1993    excess tissue removed to help  with snoring         The following portions of the patient's history were reviewed and updated as appropriate: Allergies, current medications, and problem list.    Review of Systems:   12 Pt  review of systems was negative except those addressed in the HPI,     History   Social History  . Marital Status: Legally Separated    Spouse Name: N/A    Number of Children: N/A  . Years of Education: N/A   Occupational History  . retired    Social History Main Topics  . Smoking status: Never Smoker   . Smokeless tobacco: Never Used  . Alcohol Use: 1.2 oz/week    2 Glasses of wine per week     rarely  . Drug Use: No  . Sexually Active: No   Other Topics Concern  . Not on file   Social History Narrative   Lives with second wife,  often left alone for  hours on end.  ,  Meals delayed by wife's absence from hone until 7 or 8 PM.  Daughter [name] actively involved , Ambulates with walker.     Objective:  BP 200/56  Pulse 58  Temp 99 F (37.2 C) (Oral)  Resp 14  Wt 204 lb 12 oz (92.874 kg)  SpO2 95%  General appearance: alert, cooperative and appears stated age Neck: no adenopathy, no carotid bruit, supple, symmetrical, trachea midline and thyroid not enlarged, symmetric, no tenderness/mass/nodules Back: symmetric, no curvature. ROM normal. No CVA tenderness. Lungs: clear to auscultation bilaterally Heart: irreg irreg with systolic murmurAbdomen: soft, non-tender; bowel sounds normal; no masses,  no organomegaly Pulses: 2+ and symmetric Skin: Skin color, texture, turgor normal. No rashes or lesions Lymph nodes: Cervical, supraclavicular, and axillary nodes normal. Nuero: dysarthric, antalgic gait.   Assessment and Plan:  Hypertension Labile aggravated by pain and anxiety, , thereby aortic stenosis. He has been evaluated by for renal artery stenosis ultrasound is not currently available. We made no other adjustments other than today to decrease his amlodipine to 2.5 mg in appetite have been take it twice daily. This was done because apparently the 5 mg of amlodipine has occasionally causes blood pressure dropped to a systolic of 100 which makes her feel very dizzy. I personally think that his blood pressures will not be controlled until we get his knee surgery underway and and manages his severe knee pain.  CKD (chronic kidney disease) With recent aggravation by overuse of furosemide. Hyperkalemia was also noted. He has been taking Kayexalate for several days and her asthma has been stopped. Repeat is due today.  Knee pain, right I have increased his Percocet use to 4 times daily. His steroid injection was not effective at all relieving his pain. He is evaluating total knee replacement.   Updated Medication List Outpatient  Encounter Prescriptions as of 02/14/2012  Medication Sig Dispense Refill  . ALPRAZolam (XANAX) 1 MG tablet 1/2 to 1 tablet up to three times daily as needed for anxiety or insomnia  90 tablet  3  . amLODipine (NORVASC) 5 MG tablet Take 1 tablet daily  as directed  45 tablet  3  . docusate sodium (COLACE) 100 MG capsule Take 100 mg by mouth 3 (three) times daily as needed. For stool softner      . doxazosin (CARDURA) 4 MG tablet Take 1 tablet (4 mg total) by mouth at bedtime.  30 tablet  3  . fenofibrate (TRICOR) 145 MG tablet  Take 1 tablet (145 mg total) by mouth daily.  30 tablet  6  . furosemide (LASIX) 40 MG tablet Take 0.5 tablets (20 mg total) by mouth 3 (three) times a week.  30 tablet  3  . glipiZIDE (GLUCOTROL) 5 MG tablet Take 5 mg by mouth 2 (two) times daily before a meal. Unsure of dose, only takes as needed      . glucose blood (BAYER CONTOUR NEXT TEST) test strip Use as instructed  100 each  12  . hydrALAZINE (APRESOLINE) 100 MG tablet Take 1.5 tablets (150 mg total) by mouth 4 (four) times daily.  135 tablet  6  . isosorbide mononitrate (IMDUR) 60 MG 24 hr tablet Take 2 tablets twice daily  120 tablet  6  . labetalol (NORMODYNE) 100 MG tablet Take 1 tablet (100 mg total) by mouth 2 (two) times daily as needed.  60 tablet  6  . mirtazapine (REMERON) 7.5 mg TABS Take 0.5 tablets (7.5 mg total) by mouth at bedtime. Increase after one week to whole tablet  45 tablet  1  . nitroGLYCERIN (NITROSTAT) 0.4 MG SL tablet Place 1 tablet (0.4 mg total) under the tongue every 5 (five) minutes as needed for chest pain.  25 tablet  6  . oxyCODONE-acetaminophen (ROXICET) 5-325 MG per tablet Take 1 tablet by mouth every 8 (eight) hours as needed for pain.  90 tablet  0  . potassium chloride 20 MEQ/15ML (10%) solution Take 22.5 mLs (30 mEq total) by mouth daily.  480 mL  0  . Sod Citrate-Citric Acid (CYTRA-2) 500-334 MG/5ML SOLN Two Table spoons twice a day.      . warfarin (COUMADIN) 5 MG tablet Take 5  mg by mouth daily. One and a half tablets on Friday.  Adjust dose for goal INR 2.0-3.0.  Take as directed by anticoagulation clinic      . DISCONTD: HYDROcodone-acetaminophen (LORTAB) 10-500 MG per tablet Take 1 tablet by mouth every 6 (six) hours as needed. For pain  120 tablet  2     No orders of the defined types were placed in this encounter.    No Follow-up on file.

## 2012-02-14 NOTE — Patient Instructions (Addendum)
Take the amlodipine at 2.5 mg twice daily .    Cut the water back to 3 10 ounce servings earlier in the day to prevent excessive nighttime voiding  Increase the percocet usage to every 6 hours (4 times daily)

## 2012-02-14 NOTE — Assessment & Plan Note (Signed)
With recent aggravation by overuse of furosemide. Hyperkalemia was also noted. He has been taking Kayexalate for several days and her asthma has been stopped. Repeat is due today.

## 2012-02-15 LAB — IRON AND TIBC
Iron Saturation: 23 %
Unbound Iron-Bind.Cap.: 236 ug/dL

## 2012-02-17 ENCOUNTER — Encounter: Payer: Self-pay | Admitting: Physician Assistant

## 2012-02-17 ENCOUNTER — Encounter (HOSPITAL_COMMUNITY): Payer: Self-pay | Admitting: Pharmacy Technician

## 2012-02-17 ENCOUNTER — Other Ambulatory Visit: Payer: Self-pay | Admitting: Physician Assistant

## 2012-02-17 ENCOUNTER — Ambulatory Visit: Payer: Medicare Other | Admitting: Cardiovascular Disease

## 2012-02-17 DIAGNOSIS — G4730 Sleep apnea, unspecified: Secondary | ICD-10-CM

## 2012-02-17 DIAGNOSIS — R011 Cardiac murmur, unspecified: Secondary | ICD-10-CM

## 2012-02-17 DIAGNOSIS — M51369 Other intervertebral disc degeneration, lumbar region without mention of lumbar back pain or lower extremity pain: Secondary | ICD-10-CM

## 2012-02-17 DIAGNOSIS — M5136 Other intervertebral disc degeneration, lumbar region: Secondary | ICD-10-CM | POA: Insufficient documentation

## 2012-02-17 DIAGNOSIS — N183 Chronic kidney disease, stage 3 unspecified: Secondary | ICD-10-CM

## 2012-02-17 DIAGNOSIS — I639 Cerebral infarction, unspecified: Secondary | ICD-10-CM

## 2012-02-17 DIAGNOSIS — F419 Anxiety disorder, unspecified: Secondary | ICD-10-CM

## 2012-02-17 DIAGNOSIS — I739 Peripheral vascular disease, unspecified: Secondary | ICD-10-CM | POA: Insufficient documentation

## 2012-02-17 DIAGNOSIS — I701 Atherosclerosis of renal artery: Secondary | ICD-10-CM

## 2012-02-17 NOTE — H&P (Signed)
Scott Norman is an 78 y.o. male.   Chief Complaint: right knee pain HPI: [name] is seen for follow-up from his significant bilateral knee pain with degenerative joint disease. We postponed his previous total knee replacement on the left knee due to aspiration pneumonia and poor dentition. He's followed by Dr. Sherene Sires and Dr. Darrick Huntsman. He has been cleared by his oral surgeon with complete restoration of normal dentition and no further infection. He had chest x-rays 6 weeks ago that showed some residual infiltrates and he's not had a follow-up x-rays yet. He did have a renal artery scan that showed no significant renal artery stenosis, this was done in the last 3 weeks at [facility]. Now his right knee is worse than his left.  He has been cleared by Dr Sherene Sires, Dr Darrick Huntsman, and Dr Mariah Milling for surgery  Past Medical History  Diagnosis Date  . Diabetes mellitus   . Hypertension   . Degenerative disc disease, lumbar     back surgery 2011  . Gout   . Atrial fibrillation   . PVD (peripheral vascular disease)   . Aortic stenosis   . Hyperlipidemia   . Chronic kidney disease (CKD), stage III (moderate)   . H/O colonoscopy June 2007    normal  . Screening for osteoporosis Feb 2012    T -1.3 hip  . Stroke 2012    Rmbloic, with expressive aphasia  . Renal artery stenosis, native, bilateral     s/p right RA stent 2012, pending left   . Heart murmur   . Anxiety   . Sleep apnea     does not use CPAP  . Right knee DJD     Past Surgical History  Procedure Date  . Shoulder surgery   . Lumbar spine surgery   . Renal artery stent 04/2011  . Tonsillectomy   . Appendectomy   . Throat surgery 1993    excess tissue removed to help with snoring    Family History  Problem Relation Age of Onset  . Hypertension Other   . Diabetes Other   . Pancreatic cancer Mother    Social History:  reports that he has never smoked. He has never used smokeless tobacco. He reports that he drinks about 1.2 ounces of alcohol  per week. He reports that he does not use illicit drugs.  Allergies:  Allergies  Allergen Reactions  . Ace Inhibitors     Renal insufficiency  . Beta Adrenergic Blockers     bradycardia  . Statins    Current outpatient prescriptions:amLODipine (NORVASC) 5 MG tablet, Take 1 tablet daily  as directed, Disp: 45 tablet, Rfl: 3;  chlorhexidine (PERIDEX) 0.12 % solution, Take 5 mLs by mouth Daily., Disp: , Rfl: ;  diazepam (VALIUM) 5 MG tablet, Take 0.5 tablets by mouth 2 times daily. Half a tablet with dinner and half a tablet at dinner, Disp: , Rfl:  docusate sodium (COLACE) 100 MG capsule, Take 100 mg by mouth 3 (three) times daily as needed. For stool softner, Disp: , Rfl: ;  doxazosin (CARDURA) 4 MG tablet, Take 1 tablet (4 mg total) by mouth at bedtime., Disp: 30 tablet, Rfl: 3;  fenofibrate (TRICOR) 145 MG tablet, Take 1 tablet (145 mg total) by mouth daily., Disp: 30 tablet, Rfl: 6 furosemide (LASIX) 40 MG tablet, Take 0.5 tablets (20 mg total) by mouth 3 (three) times a week., Disp: 30 tablet, Rfl: 3;  glipiZIDE (GLUCOTROL) 5 MG tablet, Take 5 mg by mouth 2 (  two) times daily before a meal. Only takes if CBG 200 or greater, Disp: , Rfl: ;  glucose blood (BAYER CONTOUR NEXT TEST) test strip, Use as instructed, Disp: 100 each, Rfl: 12 hydrALAZINE (APRESOLINE) 100 MG tablet, Take 1.5 tablets (150 mg total) by mouth 4 (four) times daily., Disp: 135 tablet, Rfl: 6;  isosorbide mononitrate (IMDUR) 60 MG 24 hr tablet, Take 60 mg by mouth daily. Take 1 tablet at bed time.  If hypertension in am then take one as needed in am, Disp: , Rfl: ;  labetalol (NORMODYNE) 100 MG tablet, Take 1 tablet (100 mg total) by mouth 2 (two) times daily as needed., Disp: 60 tablet, Rfl: 6 mirtazapine (REMERON) 7.5 mg TABS, Take 0.5 tablets (7.5 mg total) by mouth at bedtime. Increase after one week to whole tablet, Disp: 45 tablet, Rfl: 1;  nitroGLYCERIN (NITROSTAT) 0.4 MG SL tablet, Place 1 tablet (0.4 mg total) under the  tongue every 5 (five) minutes as needed for chest pain., Disp: 25 tablet, Rfl: 6 oxyCODONE-acetaminophen (ROXICET) 5-325 MG per tablet, Take 1 tablet by mouth every 8 (eight) hours as needed for pain., Disp: 90 tablet, Rfl: 0;  potassium chloride 20 MEQ/15ML (10%) solution, Take 22.5 mLs (30 mEq total) by mouth daily., Disp: 480 mL, Rfl: 0;  Sod Citrate-Citric Acid (CYTRA-2) 500-334 MG/5ML SOLN, Two Table spoons twice a day., Disp: , Rfl: ;  SPS 15 GM/60ML suspension, Take 17 g by mouth every other day as needed., Disp: , Rfl:  warfarin (COUMADIN) 5 MG tablet, Take 5 mg by mouth daily. One and a half tablets on Friday.  Adjust dose for goal INR 2.0-3.0.  Take as directed by anticoagulation clinic, Disp: , Rfl:    (Not in a hospital admission)  No results found for this or any previous visit (from the past 48 hour(s)). No results found.  Review of Systems  Constitutional: Positive for weight loss. Negative for fever, chills, malaise/fatigue and diaphoresis.  HENT: Positive for congestion. Negative for hearing loss, ear pain, nosebleeds, sore throat, neck pain, tinnitus and ear discharge.   Eyes: Negative for blurred vision, double vision, photophobia, pain, discharge and redness.  Respiratory: Negative for cough, hemoptysis, sputum production, shortness of breath, wheezing and stridor.   Cardiovascular: Negative for chest pain, palpitations, orthopnea, claudication, leg swelling and PND.  Gastrointestinal: Positive for constipation. Negative for heartburn, nausea, vomiting, abdominal pain, diarrhea, blood in stool and melena.  Genitourinary: Negative for dysuria, urgency, frequency, hematuria and flank pain.  Musculoskeletal: Positive for joint pain. Negative for myalgias, back pain and falls.       Right knee pain  Skin: Negative for itching and rash.  Neurological: Negative for dizziness, tingling, tremors, sensory change, speech change, focal weakness, seizures, loss of consciousness, weakness  and headaches.  Endo/Heme/Allergies: Positive for environmental allergies. Negative for polydipsia. Bruises/bleeds easily.  Psychiatric/Behavioral: Negative for depression, suicidal ideas, hallucinations, memory loss and substance abuse. The patient is nervous/anxious. The patient does not have insomnia.     Blood pressure 152/55, pulse 53, temperature 98.4 F (36.9 C), height 6' (1.829 m), weight 92.86 kg (204 lb 11.5 oz), SpO2 99.00%. Physical Exam  Constitutional: He is oriented to person, place, and time. He appears well-developed and well-nourished.  HENT:  Head: Normocephalic and atraumatic.  Mouth/Throat: Oropharynx is clear and moist.  Eyes: Conjunctivae normal and EOM are normal. Pupils are equal, round, and reactive to light.  Neck: Normal range of motion. Neck supple.  Cardiovascular:  Bradycardia with significant aortic stenosis murmur  Respiratory: Effort normal and breath sounds normal.  GI: Soft. Bowel sounds are normal.  Genitourinary:       Not pertinent to current symptomatology therefore not examined.  Musculoskeletal:       Examination of both knees reveals pain bilaterally 1 to 2+ crepitation 1+ synovitis pain in both knees right worse than left, range of motion is -5 to 120 degrees both knees are stable with normal patella tracking. Gait exam reveals a severe antalgic gait with pain and he walks with a cane due to this.  Neurological: He is alert and oriented to person, place, and time.  Skin: Skin is warm and dry.  Psychiatric: He has a normal mood and affect. His behavior is normal. Judgment and thought content normal.     Assessment/ Patient Active Problem List  Diagnosis  . Atrial fibrillation  . Aortic stenosis  . Hyperlipidemia  . Hypertension  . Bradycardia  . CKD (chronic kidney disease)  . Encounter for long-term (current) use of anticoagulants  . CVA (cerebrovascular accident)  . Atrial flutter  . Type II diabetes mellitus with nephropathy    . Right renal artery stenosis  . Valvular cardiomyopathy  . Screening for osteoporosis  . Knee pain, right  . Acute on chronic diastolic CHF (congestive heart failure)  . Pneumonia, organism unspecified  . Iron deficiency anemia  . Dysphagia as late effect of stroke  . Malaise  . Degenerative disc disease, lumbar  . PVD (peripheral vascular disease)  . Chronic kidney disease (CKD), stage III (moderate)  . Stroke  . Renal artery stenosis, native, bilateral  . Heart murmur  . Anxiety  . Sleep apnea    Plan Right total knee replacement.  The risks, benefits, and possible complications of the procedure were discussed in detail with the patient.  The patient is without question.  Nevae Pinnix J 02/17/2012, 11:12 AM

## 2012-02-18 ENCOUNTER — Telehealth: Payer: Self-pay

## 2012-02-18 ENCOUNTER — Encounter (HOSPITAL_COMMUNITY): Payer: Self-pay

## 2012-02-18 ENCOUNTER — Encounter (HOSPITAL_COMMUNITY)
Admission: RE | Admit: 2012-02-18 | Discharge: 2012-02-18 | Disposition: A | Discharge status: Home / Self Care | Payer: Medicare Other | Source: Ambulatory Visit | Attending: Orthopedic Surgery | Admitting: Orthopedic Surgery

## 2012-02-18 LAB — COMPREHENSIVE METABOLIC PANEL
ALT: 11 U/L (ref 0–53)
AST: 15 U/L (ref 0–37)
Alkaline Phosphatase: 42 U/L (ref 39–117)
CO2: 25 mEq/L (ref 19–32)
Calcium: 9.8 mg/dL (ref 8.4–10.5)
Potassium: 6.1 mEq/L — ABNORMAL HIGH (ref 3.5–5.1)
Sodium: 143 mEq/L (ref 135–145)
Total Protein: 6.9 g/dL (ref 6.0–8.3)

## 2012-02-18 LAB — URINE MICROSCOPIC-ADD ON

## 2012-02-18 LAB — CBC
HCT: 39.7 % (ref 39.0–52.0)
Hemoglobin: 12.7 g/dL — ABNORMAL LOW (ref 13.0–17.0)
MCH: 29.3 pg (ref 26.0–34.0)
MCHC: 32 g/dL (ref 30.0–36.0)
RDW: 16 % — ABNORMAL HIGH (ref 11.5–15.5)

## 2012-02-18 LAB — URINALYSIS, ROUTINE W REFLEX MICROSCOPIC
Bilirubin Urine: NEGATIVE
Glucose, UA: NEGATIVE mg/dL
Hgb urine dipstick: NEGATIVE

## 2012-02-18 LAB — SURGICAL PCR SCREEN
MRSA, PCR: NEGATIVE
Staphylococcus aureus: NEGATIVE

## 2012-02-18 LAB — APTT: aPTT: 39 seconds — ABNORMAL HIGH (ref 24–37)

## 2012-02-18 NOTE — Telephone Encounter (Signed)
Plan for Lovenox bridge: 02/22/12 Take last dosage of Coumadin. 02/23/12 No Coumadin, No Lovenox 02/24/12 Start Lovenox 100mg  once daily in the am. 02/25/12 Lovenox 100mg  in the am. 02/26/12 Lovenox 100mg  in the am. 02/27/12 Take last dosage of Lovenox 100mg  in the am, prior to procedure on 02/28/12.  Resume Coumadin and Lovenox post procedure per MD instruction.  Take an extra 1/2 tablet of Coumadin x 2 days, then resume previous dosage 5mg  daily except 7.5mg  on Wednesdays and Fridays.  Resume Lovenox 100mg  once daily until INR recheck.

## 2012-02-18 NOTE — Pre-Procedure Instructions (Signed)
20 MALIKHI COATES  02/18/2012   Your procedure is scheduled on:  02/28/12  Report to [facility] at 530 AM.  Call this number if you have problems the morning of surgery: [phone]   Remember:   Do not eat food:After Midnight.     Take these medicines the morning of surgery with A SIP OF WATER: norvasc,valium.cardura,hydralazine,imdur,labetalol,oxycodone,ntg   Do not wear jewelry, make-up or nail polish.  Do not wear lotions, powders, or perfumes. You may wear deodorant.  Do not shave 48 hours prior to surgery. Men may shave face and neck.  Do not bring valuables to the hospital.  Contacts, dentures or bridgework may not be worn into surgery.  Leave suitcase in the car. After surgery it may be brought to your room.  For patients admitted to the hospital, checkout time is 11:00 AM the day of discharge.   Patients discharged the day of surgery will not be allowed to drive home.  Name and phone number of your driver: family  Special Instructions: Shower using CHG 2 nights before surgery and the night before surgery.  If you shower the day of surgery use CHG.  Use special wash - you have one bottle of CHG for all showers.  You should use approximately 1/3 of the bottle for each shower.   Please read over the following fact sheets that you were given: Pain Booklet, Coughing and Deep Breathing, Blood Transfusion Information, MRSA Information and Surgical Site Infection Prevention

## 2012-02-18 NOTE — Telephone Encounter (Signed)
Pt's caregiver called, states pt has been rescheduled for knee surgery on 02/28/12.  Pt has appt in [facility] on 02/23/12.  Instructed caregiver to have pt take his last dosage of Coumadin on 02/22/12 and keep appt in Coumadin Clinic to dose Lovenox.   Per Dr Mariah Milling:  Acceptable risk for surgery.  Would do lovenox bridge at renal dosing which is 100 mg SQ once a day  Stop warfarin 5 days before surgery  Start lovenox three days before surgery  No lovenox morning of surgery  Start lovenox back after surgery, day after procedure (needs TED hose)  5 days after surgery, restart warfarin day after surgery

## 2012-02-19 LAB — URINE CULTURE

## 2012-02-21 NOTE — Consult Note (Addendum)
Anesthesia Chart Review:  Patient is a 78 year old male scheduled for a right TKR on 02/28/12 by Dr. Thurston Hole.  This procedure was initially scheduled for 12/13/11, but was delayed due to diagnosis of aspiration PNA on CXR/CT in July 2013.  Dental referral was also recommended by Pulmonologist Dr. Sherene Sires.  According to patient's H&P, patient was cleared by his oral surgeon.  He has a significant past medical history. This includes mild to moderate AS, paroxysmal afib, labile HTN, DM2, CKD Stage III, chronic diastolic CHF, LE edema, anemia, OSA, large cardioembolic left MCA CVA with expressive aphasia May 2012 treated with TPA, HLD, PVD, gout, renal artery stenosis s/p right stent, anxiety, prior throat, shoulder and lumbar surgeries. His Neurologist is Dr. Pearlean Brownie, Nephrologist is Dr. Cherylann Ratel in [place], Dr. Mariah Milling is his Cardiologist, Dr. Duncan Dull is his PCP. Dr. Darrick Huntsman last saw him on 02/17/12.  She made further adjustments in his hypertension regimen, but personally felt that his BP would not be controlled until after his TKA (due to significant knee pain).  BP was 142/48 at his PAT visit on 02/18/12.  Pulmonologist Dr. Sherene Sires was also notified of planned procedure.  According to his telephone encounter from 02/12/12: "cxr is definitely improved but not clear - in a never smoker this is statistically not likely to be anything but scar from aspiraton injury for which he is at marked increased risk.  If feeling better ok with me to go ahead with surgery and return here post op for f/u. Alternative is to return here in October to regroup if not 100% improved symptom wise."  Dr. Mariah Milling last saw him on 02/03/12.  He is aware of planned TKR.  He had previously cleared Scott Norman for this procedure (see 10/04/11 Note) and recommended IV fluids be minimized given underlying diastolic dysfunction and Lovenox bridge while off Coumadin due to history of afib and CVA.   EKG from 01/05/12 [facility]) showed SR with  first degree AVB, right BBB, non-specific ST/T wave abnormality in inferolateral leads.   Echo on 02/01/12 showed: - Left ventricle: The cavity size was normal. Moderate LVH. The estimated ejection fraction was 55-60%. Wall motion was normal; there were no regional wall motion abnormalities. Grade 1 diastolic dysfunction. - Aortic valve: Transvalvular velocity was increased.  There is mild/moderate AS. Mild AR.  Mean gradient: 27mm Hg (S). Peak gradient: 58 mm Hg (S). Valve area: 1.56 cm2 (VTI). - Mitral valve:  Calcified annulus. Mild MR. - Left atrium: The atrium was mildly dilated. - Tricuspid valve: Mild TR. - Pulmonary arteries: PA peak pressure 37 mm Hg (S).  CXR on 02/10/12 showed:  Persistent left midlung zone infiltrate with more lateral component compared to previous studies. Given the failure of this process to resolve over more than 2 months, findings are concerning for possible malignancy. If the patient's symptoms are typical for infection, consider one additional follow-up. However, if the patient is asymptomatic, a repeat CT would be recommended.  (This was reviewed by Dr. Sherene Sires with above pre-operative recommendations.)  He had a CT scan of the chest on 12/07/11, that showed:  1. The opacity questioned on recent chest x-ray corresponds to parenchymal opacity within the superior segment of the left lower lobe and the lingula most likely representing pneumonia. Follow-up chest x-ray is recommended to ensure clearing however.  2. Small bilateral pleural effusions.  3. Slightly prominent mediastinal lymph nodes. None appear  pathologically enlarged.  4. Coronary artery calcifications. Cardiomegaly.   Labs  from 02/18/12 noted.  K 6.1.  BUN/Cr 41/2.14.  (Cr on 09/22/11 @ Dr. Cherylann Ratel office was 1.95 and has been 1.88-2.33 since July 2012.)  PT/PTT elevated.  Urine culture on 35,000 multiple bacterial morphotypes, none predominant.  He will need his K+ and PT/PTT repeated preoperatively.  I  called UA, Cr results to [employer], PA-C.  She will review complete results in Epic.   Shonna Chock, PA-C  02/21/12 1344  Update: 02/23/12 1353 I spoke with Kirstin yesterday.  Scott Norman was prescribed Kayexalate by his Nephrologist for hyperkalemia. He will get a STAT BMET and PT/PTT on arrival the day of surgery.

## 2012-02-22 ENCOUNTER — Telehealth: Payer: Self-pay | Admitting: Internal Medicine

## 2012-02-22 NOTE — Telephone Encounter (Signed)
Patients caregiver came in the office and was given the test results.

## 2012-02-22 NOTE — Telephone Encounter (Signed)
Daughter called in states her dad is scheduled for surgery on Monday 02/28/12. However yesterday Dr. Thurston Hole called and said that his potassium level was up she thinks to 6.5, she states her dad has been on some kind of medication for this for the past 10 days.  She wants to know if you can get the labs from [place] and [place] office.  She states that he had labs drawn here last Monday and at [facility] on Friday, she states she never heard anything from our office. Please advise, she doesn't want to postpone surgery again if she doesn't have to, she states it has been postponed 3 times now.

## 2012-02-23 ENCOUNTER — Ambulatory Visit (INDEPENDENT_AMBULATORY_CARE_PROVIDER_SITE_OTHER): Payer: Medicare Other

## 2012-02-23 DIAGNOSIS — I6350 Cerebral infarction due to unspecified occlusion or stenosis of unspecified cerebral artery: Secondary | ICD-10-CM

## 2012-02-23 DIAGNOSIS — I4891 Unspecified atrial fibrillation: Secondary | ICD-10-CM

## 2012-02-23 DIAGNOSIS — Z7901 Long term (current) use of anticoagulants: Secondary | ICD-10-CM

## 2012-02-23 DIAGNOSIS — I639 Cerebral infarction, unspecified: Secondary | ICD-10-CM

## 2012-02-23 MED ORDER — ENOXAPARIN SODIUM 100 MG/ML ~~LOC~~ SOLN: 100.0000 mg | Freq: Every day | SUBCUTANEOUS | Status: DC

## 2012-02-23 NOTE — Patient Instructions (Signed)
Plan for Lovenox bridge:  02/22/12 Take last dosage of Coumadin.  02/23/12 No Coumadin, No Lovenox  02/24/12 Start Lovenox 100mg  once daily in the am.  02/25/12 Lovenox 100mg  in the am.  02/26/12 Lovenox 100mg  in the am.  02/27/12 Take last dosage of Lovenox 100mg  in the am, prior to procedure on 02/28/12.  Pt going to rehab after surgery, will have Coumadin and Lovenox managed there.

## 2012-02-25 NOTE — Progress Notes (Signed)
NOTIFIED PATIENT'S SON OF TIME CHANGE, WILL ARRIVE AT 630 AM.

## 2012-02-27 MED ORDER — CEFAZOLIN SODIUM-DEXTROSE 2-3 GM-% IV SOLR: 2.0000 g | INTRAVENOUS | Status: DC

## 2012-02-27 MED ORDER — LACTATED RINGERS IV SOLN
INTRAVENOUS | Status: DC
  Administered 2012-02-28: 09:00:00 via INTRAVENOUS

## 2012-02-27 MED ORDER — CHLORHEXIDINE GLUCONATE 4 % EX LIQD: 60.0000 mL | Freq: Once | CUTANEOUS | Status: DC

## 2012-02-27 MED ORDER — CEFAZOLIN SODIUM-DEXTROSE 2-3 GM-% IV SOLR
2.0000 g | INTRAVENOUS | Status: DC
  Filled 2012-02-27: qty 50

## 2012-02-27 MED ORDER — POVIDONE-IODINE 7.5 % EX SOLN
Freq: Once | CUTANEOUS | Status: DC
  Filled 2012-02-27: qty 118

## 2012-02-27 MED ORDER — LACTATED RINGERS IV SOLN: INTRAVENOUS | Status: DC

## 2012-02-28 ENCOUNTER — Encounter (HOSPITAL_COMMUNITY): Payer: Self-pay | Admitting: Anesthesiology

## 2012-02-28 ENCOUNTER — Encounter (HOSPITAL_COMMUNITY): Payer: Self-pay | Admitting: *Deleted

## 2012-02-28 ENCOUNTER — Inpatient Hospital Stay (HOSPITAL_COMMUNITY): Payer: Medicare Other | Admitting: Vascular Surgery

## 2012-02-28 ENCOUNTER — Inpatient Hospital Stay (HOSPITAL_COMMUNITY)
Admission: RE | Admit: 2012-02-28 | Discharge: 2012-03-02 | DRG: 469 | Disposition: A | Discharge status: Skilled Nursing Facility | Payer: Medicare Other | Source: Ambulatory Visit | Attending: Orthopedic Surgery | Admitting: Orthopedic Surgery

## 2012-02-28 ENCOUNTER — Encounter (HOSPITAL_COMMUNITY): Payer: Self-pay | Admitting: Vascular Surgery

## 2012-02-28 ENCOUNTER — Encounter (HOSPITAL_COMMUNITY)
Admission: RE | Disposition: A | Discharge status: Skilled Nursing Facility | Payer: Self-pay | Source: Ambulatory Visit | Attending: Orthopedic Surgery

## 2012-02-28 DIAGNOSIS — M51379 Other intervertebral disc degeneration, lumbosacral region without mention of lumbar back pain or lower extremity pain: Secondary | ICD-10-CM | POA: Diagnosis present

## 2012-02-28 DIAGNOSIS — I69391 Dysphagia following cerebral infarction: Secondary | ICD-10-CM

## 2012-02-28 DIAGNOSIS — M109 Gout, unspecified: Secondary | ICD-10-CM | POA: Diagnosis present

## 2012-02-28 DIAGNOSIS — I129 Hypertensive chronic kidney disease with stage 1 through stage 4 chronic kidney disease, or unspecified chronic kidney disease: Secondary | ICD-10-CM | POA: Diagnosis present

## 2012-02-28 DIAGNOSIS — R1310 Dysphagia, unspecified: Secondary | ICD-10-CM | POA: Diagnosis present

## 2012-02-28 DIAGNOSIS — N189 Chronic kidney disease, unspecified: Secondary | ICD-10-CM | POA: Diagnosis present

## 2012-02-28 DIAGNOSIS — I739 Peripheral vascular disease, unspecified: Secondary | ICD-10-CM | POA: Diagnosis present

## 2012-02-28 DIAGNOSIS — D509 Iron deficiency anemia, unspecified: Secondary | ICD-10-CM | POA: Diagnosis present

## 2012-02-28 DIAGNOSIS — E1129 Type 2 diabetes mellitus with other diabetic kidney complication: Secondary | ICD-10-CM | POA: Diagnosis present

## 2012-02-28 DIAGNOSIS — I701 Atherosclerosis of renal artery: Secondary | ICD-10-CM | POA: Diagnosis present

## 2012-02-28 DIAGNOSIS — I359 Nonrheumatic aortic valve disorder, unspecified: Secondary | ICD-10-CM

## 2012-02-28 DIAGNOSIS — E785 Hyperlipidemia, unspecified: Secondary | ICD-10-CM

## 2012-02-28 DIAGNOSIS — R001 Bradycardia, unspecified: Secondary | ICD-10-CM | POA: Diagnosis present

## 2012-02-28 DIAGNOSIS — Z01812 Encounter for preprocedural laboratory examination: Secondary | ICD-10-CM

## 2012-02-28 DIAGNOSIS — D62 Acute posthemorrhagic anemia: Secondary | ICD-10-CM | POA: Diagnosis not present

## 2012-02-28 DIAGNOSIS — I498 Other specified cardiac arrhythmias: Secondary | ICD-10-CM | POA: Diagnosis present

## 2012-02-28 DIAGNOSIS — I639 Cerebral infarction, unspecified: Secondary | ICD-10-CM | POA: Diagnosis present

## 2012-02-28 DIAGNOSIS — G4733 Obstructive sleep apnea (adult) (pediatric): Secondary | ICD-10-CM | POA: Diagnosis present

## 2012-02-28 DIAGNOSIS — M1711 Unilateral primary osteoarthritis, right knee: Secondary | ICD-10-CM | POA: Diagnosis present

## 2012-02-28 DIAGNOSIS — M5137 Other intervertebral disc degeneration, lumbosacral region: Secondary | ICD-10-CM | POA: Diagnosis present

## 2012-02-28 DIAGNOSIS — I10 Essential (primary) hypertension: Secondary | ICD-10-CM

## 2012-02-28 DIAGNOSIS — Z888 Allergy status to other drugs, medicaments and biological substances status: Secondary | ICD-10-CM

## 2012-02-28 DIAGNOSIS — E1121 Type 2 diabetes mellitus with diabetic nephropathy: Secondary | ICD-10-CM | POA: Diagnosis present

## 2012-02-28 DIAGNOSIS — Z79899 Other long term (current) drug therapy: Secondary | ICD-10-CM

## 2012-02-28 DIAGNOSIS — F411 Generalized anxiety disorder: Secondary | ICD-10-CM | POA: Diagnosis present

## 2012-02-28 DIAGNOSIS — I350 Nonrheumatic aortic (valve) stenosis: Secondary | ICD-10-CM

## 2012-02-28 DIAGNOSIS — I5033 Acute on chronic diastolic (congestive) heart failure: Secondary | ICD-10-CM | POA: Diagnosis present

## 2012-02-28 DIAGNOSIS — I428 Other cardiomyopathies: Secondary | ICD-10-CM | POA: Diagnosis present

## 2012-02-28 DIAGNOSIS — N058 Unspecified nephritic syndrome with other morphologic changes: Secondary | ICD-10-CM | POA: Diagnosis present

## 2012-02-28 DIAGNOSIS — I69991 Dysphagia following unspecified cerebrovascular disease: Secondary | ICD-10-CM

## 2012-02-28 DIAGNOSIS — G4730 Sleep apnea, unspecified: Secondary | ICD-10-CM | POA: Diagnosis present

## 2012-02-28 DIAGNOSIS — F419 Anxiety disorder, unspecified: Secondary | ICD-10-CM | POA: Diagnosis present

## 2012-02-28 DIAGNOSIS — M25561 Pain in right knee: Secondary | ICD-10-CM | POA: Diagnosis present

## 2012-02-28 DIAGNOSIS — I509 Heart failure, unspecified: Secondary | ICD-10-CM | POA: Diagnosis present

## 2012-02-28 DIAGNOSIS — Z794 Long term (current) use of insulin: Secondary | ICD-10-CM

## 2012-02-28 DIAGNOSIS — I4891 Unspecified atrial fibrillation: Secondary | ICD-10-CM | POA: Diagnosis present

## 2012-02-28 DIAGNOSIS — M5136 Other intervertebral disc degeneration, lumbar region: Secondary | ICD-10-CM | POA: Diagnosis present

## 2012-02-28 DIAGNOSIS — Z7901 Long term (current) use of anticoagulants: Secondary | ICD-10-CM

## 2012-02-28 DIAGNOSIS — M1710 Unilateral primary osteoarthritis, unspecified knee: Principal | ICD-10-CM | POA: Diagnosis present

## 2012-02-28 DIAGNOSIS — Z23 Encounter for immunization: Secondary | ICD-10-CM

## 2012-02-28 DIAGNOSIS — I4892 Unspecified atrial flutter: Secondary | ICD-10-CM | POA: Diagnosis present

## 2012-02-28 DIAGNOSIS — N183 Chronic kidney disease, stage 3 unspecified: Secondary | ICD-10-CM | POA: Diagnosis present

## 2012-02-28 HISTORY — DX: Unilateral primary osteoarthritis, right knee: M17.11

## 2012-02-28 HISTORY — PX: TOTAL KNEE ARTHROPLASTY: SHX125

## 2012-02-28 LAB — BASIC METABOLIC PANEL
BUN: 41 mg/dL — ABNORMAL HIGH (ref 6–23)
Calcium: 9.6 mg/dL (ref 8.4–10.5)
Creatinine, Ser: 2.16 mg/dL — ABNORMAL HIGH (ref 0.50–1.35)
GFR calc Af Amer: 32 mL/min — ABNORMAL LOW (ref >90)
GFR calc non Af Amer: 27 mL/min — ABNORMAL LOW (ref >90)

## 2012-02-28 LAB — APTT: aPTT: 41 seconds — ABNORMAL HIGH (ref 24–37)

## 2012-02-28 LAB — PROTIME-INR
INR: 1.1 (ref 0.00–1.49)
Prothrombin Time: 14.1 seconds (ref 11.6–15.2)

## 2012-02-28 SURGERY — ARTHROPLASTY, KNEE, TOTAL
Anesthesia: General | Site: Knee | Laterality: Right | Wound class: Clean

## 2012-02-28 MED ORDER — ISOSORBIDE MONONITRATE ER 60 MG PO TB24
60.0000 mg | ORAL_TABLET | Freq: Every day | ORAL | Status: DC
  Administered 2012-02-28 – 2012-03-01 (×3): 60 mg via ORAL
  Filled 2012-02-28 (×4): qty 1

## 2012-02-28 MED ORDER — OXYCODONE HCL 5 MG/5ML PO SOLN: 5.0000 mg | Freq: Once | ORAL | Status: DC | PRN

## 2012-02-28 MED ORDER — NITROGLYCERIN 0.4 MG SL SUBL: 0.4000 mg | SUBLINGUAL_TABLET | SUBLINGUAL | Status: DC | PRN

## 2012-02-28 MED ORDER — CEFUROXIME SODIUM 1.5 G IJ SOLR
INTRAMUSCULAR | Status: AC
  Filled 2012-02-28: qty 1.5

## 2012-02-28 MED ORDER — MIRTAZAPINE 7.5 MG PO TABS
7.5000 mg | ORAL_TABLET | Freq: Every day | ORAL | Status: DC
  Administered 2012-02-28 – 2012-03-01 (×3): 7.5 mg via ORAL
  Filled 2012-02-28 (×4): qty 1

## 2012-02-28 MED ORDER — LIDOCAINE HCL (CARDIAC) 20 MG/ML IV SOLN
INTRAVENOUS | Status: DC | PRN
  Administered 2012-02-28: 60 mg via INTRAVENOUS

## 2012-02-28 MED ORDER — DIAZEPAM 5 MG PO TABS
2.5000 mg | ORAL_TABLET | Freq: Two times a day (BID) | ORAL | Status: DC
  Administered 2012-02-28 – 2012-03-02 (×6): 2.5 mg via ORAL
  Filled 2012-02-28 (×6): qty 1

## 2012-02-28 MED ORDER — HYDRALAZINE HCL 20 MG/ML IJ SOLN
INTRAMUSCULAR | Status: AC
  Administered 2012-02-28 (×4): 2.5 mg
  Filled 2012-02-28: qty 1

## 2012-02-28 MED ORDER — ONDANSETRON HCL 4 MG/2ML IJ SOLN: 4.0000 mg | Freq: Four times a day (QID) | INTRAMUSCULAR | Status: DC | PRN

## 2012-02-28 MED ORDER — MENTHOL 3 MG MT LOZG
1.0000 | LOZENGE | OROMUCOSAL | Status: DC | PRN
  Filled 2012-02-28: qty 9

## 2012-02-28 MED ORDER — ROCURONIUM BROMIDE 100 MG/10ML IV SOLN
INTRAVENOUS | Status: DC | PRN
  Administered 2012-02-28: 40 mg via INTRAVENOUS

## 2012-02-28 MED ORDER — AMLODIPINE BESYLATE 5 MG PO TABS
5.0000 mg | ORAL_TABLET | Freq: Every day | ORAL | Status: DC
  Administered 2012-02-28 – 2012-02-29 (×2): 5 mg via ORAL
  Filled 2012-02-28 (×2): qty 1

## 2012-02-28 MED ORDER — ACETAMINOPHEN 325 MG PO TABS: 650.0000 mg | ORAL_TABLET | Freq: Four times a day (QID) | ORAL | Status: DC | PRN

## 2012-02-28 MED ORDER — MIDAZOLAM HCL 5 MG/5ML IJ SOLN
INTRAMUSCULAR | Status: DC | PRN
  Administered 2012-02-28: 0.5 mg via INTRAVENOUS

## 2012-02-28 MED ORDER — DEXAMETHASONE SODIUM PHOSPHATE 10 MG/ML IJ SOLN
INTRAMUSCULAR | Status: AC
  Filled 2012-02-28: qty 1

## 2012-02-28 MED ORDER — CHLORHEXIDINE GLUCONATE 0.12 % MT SOLN
5.0000 mL | Freq: Two times a day (BID) | OROMUCOSAL | Status: DC
  Administered 2012-02-28: 5 mL via OROMUCOSAL
  Filled 2012-02-28: qty 15

## 2012-02-28 MED ORDER — CEFAZOLIN SODIUM-DEXTROSE 2-3 GM-% IV SOLR
INTRAVENOUS | Status: DC | PRN
  Administered 2012-02-28: 2 g via INTRAVENOUS

## 2012-02-28 MED ORDER — CYTRA-2 500-334 MG/5ML PO SOLN
30.0000 mL | Freq: Two times a day (BID) | ORAL | Status: DC
Start: 2012-02-28 — End: 2012-02-28

## 2012-02-28 MED ORDER — HYDROMORPHONE HCL PF 1 MG/ML IJ SOLN
INTRAMUSCULAR | Status: AC
  Administered 2012-02-28: 0.5 mg via INTRAVENOUS
  Filled 2012-02-28: qty 1

## 2012-02-28 MED ORDER — LACTATED RINGERS IV SOLN
INTRAVENOUS | Status: DC | PRN
  Administered 2012-02-28: 09:00:00 via INTRAVENOUS

## 2012-02-28 MED ORDER — NEOSTIGMINE METHYLSULFATE 1 MG/ML IJ SOLN
INTRAMUSCULAR | Status: DC | PRN
  Administered 2012-02-28: 3 mg via INTRAVENOUS

## 2012-02-28 MED ORDER — HYDRALAZINE HCL 50 MG PO TABS
150.0000 mg | ORAL_TABLET | Freq: Three times a day (TID) | ORAL | Status: DC
  Administered 2012-02-28 – 2012-03-02 (×8): 150 mg via ORAL
  Filled 2012-02-28 (×12): qty 3

## 2012-02-28 MED ORDER — PROPOFOL 10 MG/ML IV BOLUS
INTRAVENOUS | Status: DC | PRN
  Administered 2012-02-28: 30 mg via INTRAVENOUS
  Administered 2012-02-28: 80 mg via INTRAVENOUS

## 2012-02-28 MED ORDER — DOCUSATE SODIUM 100 MG PO CAPS
100.0000 mg | ORAL_CAPSULE | Freq: Two times a day (BID) | ORAL | Status: DC
  Administered 2012-02-28 – 2012-03-02 (×6): 100 mg via ORAL
  Filled 2012-02-28 (×6): qty 1

## 2012-02-28 MED ORDER — ENOXAPARIN SODIUM 30 MG/0.3ML ~~LOC~~ SOLN
30.0000 mg | Freq: Two times a day (BID) | SUBCUTANEOUS | Status: DC
  Administered 2012-02-29 – 2012-03-02 (×5): 30 mg via SUBCUTANEOUS
  Filled 2012-02-28 (×7): qty 0.3

## 2012-02-28 MED ORDER — METOCLOPRAMIDE HCL 5 MG/ML IJ SOLN
5.0000 mg | Freq: Three times a day (TID) | INTRAMUSCULAR | Status: DC | PRN
  Filled 2012-02-28: qty 2

## 2012-02-28 MED ORDER — METOCLOPRAMIDE HCL 5 MG PO TABS
5.0000 mg | ORAL_TABLET | Freq: Three times a day (TID) | ORAL | Status: DC | PRN
  Filled 2012-02-28: qty 2

## 2012-02-28 MED ORDER — DIPHENHYDRAMINE HCL 12.5 MG/5ML PO ELIX
12.5000 mg | ORAL_SOLUTION | ORAL | Status: DC | PRN
  Filled 2012-02-28: qty 10

## 2012-02-28 MED ORDER — WARFARIN - PHARMACIST DOSING INPATIENT: Freq: Every day | Status: DC

## 2012-02-28 MED ORDER — BUPIVACAINE-EPINEPHRINE PF 0.25-1:200000 % IJ SOLN
INTRAMUSCULAR | Status: AC
  Filled 2012-02-28: qty 30

## 2012-02-28 MED ORDER — ONDANSETRON HCL 4 MG PO TABS: 4.0000 mg | ORAL_TABLET | Freq: Four times a day (QID) | ORAL | Status: DC | PRN

## 2012-02-28 MED ORDER — OXYCODONE HCL 5 MG PO TABS
5.0000 mg | ORAL_TABLET | ORAL | Status: DC | PRN
  Administered 2012-02-29 – 2012-03-01 (×6): 5 mg via ORAL
  Administered 2012-03-02: 10 mg via ORAL
  Filled 2012-02-28 (×4): qty 1
  Filled 2012-02-28: qty 2
  Filled 2012-02-28 (×2): qty 1

## 2012-02-28 MED ORDER — FENOFIBRATE 160 MG PO TABS
160.0000 mg | ORAL_TABLET | Freq: Every day | ORAL | Status: DC
  Administered 2012-02-28 – 2012-03-02 (×4): 160 mg via ORAL
  Filled 2012-02-28 (×4): qty 1

## 2012-02-28 MED ORDER — DEXAMETHASONE 6 MG PO TABS
10.0000 mg | ORAL_TABLET | Freq: Every day | ORAL | Status: AC
  Administered 2012-02-29 – 2012-03-01 (×2): 10 mg via ORAL
  Filled 2012-02-28 (×3): qty 1

## 2012-02-28 MED ORDER — PHENOL 1.4 % MT LIQD: 1.0000 | OROMUCOSAL | Status: DC | PRN

## 2012-02-28 MED ORDER — CITRIC ACID-SODIUM CITRATE 334-500 MG/5ML PO SOLN
30.0000 mL | Freq: Once | ORAL | Status: DC
  Filled 2012-02-28: qty 30

## 2012-02-28 MED ORDER — INSULIN ASPART 100 UNIT/ML ~~LOC~~ SOLN: 0.0000 [IU] | Freq: Every day | SUBCUTANEOUS | Status: DC

## 2012-02-28 MED ORDER — NYSTATIN 100000 UNIT/GM EX POWD
CUTANEOUS | Status: DC
  Filled 2012-02-28: qty 15

## 2012-02-28 MED ORDER — HYDROMORPHONE HCL PF 1 MG/ML IJ SOLN
0.5000 mg | INTRAMUSCULAR | Status: DC | PRN
  Administered 2012-02-28 – 2012-02-29 (×5): 1 mg via INTRAVENOUS
  Filled 2012-02-28 (×6): qty 1

## 2012-02-28 MED ORDER — DOXAZOSIN MESYLATE 4 MG PO TABS
4.0000 mg | ORAL_TABLET | Freq: Every day | ORAL | Status: DC
  Administered 2012-02-28 – 2012-03-01 (×3): 4 mg via ORAL
  Filled 2012-02-28 (×4): qty 1

## 2012-02-28 MED ORDER — FUROSEMIDE 20 MG PO TABS
20.0000 mg | ORAL_TABLET | ORAL | Status: DC
  Administered 2012-02-28 – 2012-03-01 (×2): 20 mg via ORAL
  Filled 2012-02-28 (×3): qty 1

## 2012-02-28 MED ORDER — INSULIN ASPART 100 UNIT/ML ~~LOC~~ SOLN
0.0000 [IU] | Freq: Three times a day (TID) | SUBCUTANEOUS | Status: DC
  Administered 2012-02-29: 2 [IU] via SUBCUTANEOUS
  Administered 2012-02-29 – 2012-03-01 (×2): 3 [IU] via SUBCUTANEOUS

## 2012-02-28 MED ORDER — DEXAMETHASONE SODIUM PHOSPHATE 10 MG/ML IJ SOLN
10.0000 mg | Freq: Every day | INTRAMUSCULAR | Status: AC
  Filled 2012-02-28 (×3): qty 1

## 2012-02-28 MED ORDER — PROMETHAZINE HCL 25 MG/ML IJ SOLN: 6.2500 mg | INTRAMUSCULAR | Status: DC | PRN

## 2012-02-28 MED ORDER — ACETAMINOPHEN 650 MG RE SUPP: 650.0000 mg | Freq: Four times a day (QID) | RECTAL | Status: DC | PRN

## 2012-02-28 MED ORDER — WARFARIN SODIUM 7.5 MG PO TABS
7.5000 mg | ORAL_TABLET | Freq: Once | ORAL | Status: AC
  Administered 2012-02-28: 7.5 mg via ORAL
  Filled 2012-02-28: qty 1

## 2012-02-28 MED ORDER — BUPIVACAINE-EPINEPHRINE 0.25% -1:200000 IJ SOLN
INTRAMUSCULAR | Status: DC | PRN
  Administered 2012-02-28: 30 mL

## 2012-02-28 MED ORDER — OXYCODONE HCL 5 MG PO TABS: 5.0000 mg | ORAL_TABLET | Freq: Once | ORAL | Status: DC | PRN

## 2012-02-28 MED ORDER — MEPERIDINE HCL 25 MG/ML IJ SOLN: 6.2500 mg | INTRAMUSCULAR | Status: DC | PRN

## 2012-02-28 MED ORDER — HYDROMORPHONE HCL PF 1 MG/ML IJ SOLN
0.2500 mg | INTRAMUSCULAR | Status: DC | PRN
  Administered 2012-02-28 (×3): 0.5 mg via INTRAVENOUS

## 2012-02-28 MED ORDER — SODIUM CHLORIDE 0.9 % IV SOLN
INTRAVENOUS | Status: DC | PRN
  Administered 2012-02-28: 10:00:00 via INTRAVENOUS

## 2012-02-28 MED ORDER — HYDRALAZINE HCL 20 MG/ML IJ SOLN
INTRAMUSCULAR | Status: DC | PRN
  Administered 2012-02-28 (×2): 2 mg via INTRAVENOUS

## 2012-02-28 MED ORDER — SODIUM CHLORIDE 0.9 % IV SOLN
INTRAVENOUS | Status: DC
  Administered 2012-02-29: 01:00:00 via INTRAVENOUS

## 2012-02-28 MED ORDER — CEFAZOLIN SODIUM-DEXTROSE 2-3 GM-% IV SOLR
2.0000 g | Freq: Four times a day (QID) | INTRAVENOUS | Status: AC
  Administered 2012-02-28 (×2): 2 g via INTRAVENOUS
  Filled 2012-02-28 (×3): qty 50

## 2012-02-28 MED ORDER — CEFUROXIME SODIUM 1.5 G IJ SOLR
INTRAMUSCULAR | Status: DC | PRN
  Administered 2012-02-28: 1.5 g

## 2012-02-28 MED ORDER — GLYCOPYRROLATE 0.2 MG/ML IJ SOLN
INTRAMUSCULAR | Status: DC | PRN
  Administered 2012-02-28: 0.4 mg via INTRAVENOUS

## 2012-02-28 MED ORDER — SODIUM CHLORIDE 0.9 % IR SOLN
Status: DC | PRN
  Administered 2012-02-28: 3000 mL

## 2012-02-28 MED ORDER — FENTANYL CITRATE 0.05 MG/ML IJ SOLN
INTRAMUSCULAR | Status: DC | PRN
  Administered 2012-02-28 (×7): 50 ug via INTRAVENOUS
  Administered 2012-02-28: 25 ug via INTRAVENOUS
  Administered 2012-02-28: 50 ug via INTRAVENOUS
  Administered 2012-02-28: 75 ug via INTRAVENOUS

## 2012-02-28 SURGICAL SUPPLY — 69 items
BANDAGE ELASTIC 6 VELCRO ST LF (GAUZE/BANDAGES/DRESSINGS) ×2 IMPLANT
BANDAGE ESMARK 6X9 LF (GAUZE/BANDAGES/DRESSINGS) ×1 IMPLANT
BENZOIN TINCTURE PRP APPL 2/3 (GAUZE/BANDAGES/DRESSINGS) ×2 IMPLANT
BLADE SAGITTAL 25.0X1.19X90 (BLADE) ×2 IMPLANT
BLADE SAW SGTL 11.0X1.19X90.0M (BLADE) IMPLANT
BLADE SAW SGTL 13.0X1.19X90.0M (BLADE) ×2 IMPLANT
BLADE SURG 10 STRL SS (BLADE) ×4 IMPLANT
BNDG ELASTIC 6X15 VLCR STRL LF (GAUZE/BANDAGES/DRESSINGS) ×2 IMPLANT
BNDG ESMARK 6X9 LF (GAUZE/BANDAGES/DRESSINGS) ×2
BOWL SMART MIX CTS (DISPOSABLE) ×2 IMPLANT
CEMENT HV SMART SET (Cement) ×4 IMPLANT
CLOTH BEACON ORANGE TIMEOUT ST (SAFETY) ×2 IMPLANT
COVER BACK TABLE 24X17X13 BIG (DRAPES) IMPLANT
COVER PROBE W GEL 5X96 (DRAPES) ×2 IMPLANT
COVER SURGICAL LIGHT HANDLE (MISCELLANEOUS) ×2 IMPLANT
CUFF TOURNIQUET SINGLE 34IN LL (TOURNIQUET CUFF) ×2 IMPLANT
CUFF TOURNIQUET SINGLE 44IN (TOURNIQUET CUFF) IMPLANT
DRAPE EXTREMITY T 121X128X90 (DRAPE) ×2 IMPLANT
DRAPE INCISE IOBAN 66X45 STRL (DRAPES) ×2 IMPLANT
DRAPE PROXIMA HALF (DRAPES) ×2 IMPLANT
DRAPE U-SHAPE 47X51 STRL (DRAPES) ×2 IMPLANT
DRSG ADAPTIC 3X8 NADH LF (GAUZE/BANDAGES/DRESSINGS) ×2 IMPLANT
DRSG PAD ABDOMINAL 8X10 ST (GAUZE/BANDAGES/DRESSINGS) ×2 IMPLANT
DURAPREP 26ML APPLICATOR (WOUND CARE) ×2 IMPLANT
ELECT CAUTERY BLADE 6.4 (BLADE) ×2 IMPLANT
ELECT REM PT RETURN 9FT ADLT (ELECTROSURGICAL) ×2
ELECTRODE REM PT RTRN 9FT ADLT (ELECTROSURGICAL) ×1 IMPLANT
EVACUATOR 1/8 PVC DRAIN (DRAIN) ×2 IMPLANT
FACESHIELD LNG OPTICON STERILE (SAFETY) ×2 IMPLANT
GLOVE BIO SURGEON STRL SZ7 (GLOVE) ×2 IMPLANT
GLOVE BIOGEL PI IND STRL 7.0 (GLOVE) ×1 IMPLANT
GLOVE BIOGEL PI IND STRL 7.5 (GLOVE) ×1 IMPLANT
GLOVE BIOGEL PI INDICATOR 7.0 (GLOVE) ×1
GLOVE BIOGEL PI INDICATOR 7.5 (GLOVE) ×1
GLOVE SS BIOGEL STRL SZ 7.5 (GLOVE) ×1 IMPLANT
GLOVE SUPERSENSE BIOGEL SZ 7.5 (GLOVE) ×1
GOWN PREVENTION PLUS XLARGE (GOWN DISPOSABLE) ×4 IMPLANT
GOWN STRL NON-REIN LRG LVL3 (GOWN DISPOSABLE) ×4 IMPLANT
HANDPIECE INTERPULSE COAX TIP (DISPOSABLE) ×1
HOOD PEEL AWAY FACE SHEILD DIS (HOOD) ×4 IMPLANT
IMMOBILIZER KNEE 22 UNIV (SOFTGOODS) ×2 IMPLANT
INSERT CUSHION PRONEVIEW LG (MISCELLANEOUS) ×2 IMPLANT
KIT BASIN OR (CUSTOM PROCEDURE TRAY) ×2 IMPLANT
KIT ROOM TURNOVER OR (KITS) ×2 IMPLANT
MANIFOLD NEPTUNE II (INSTRUMENTS) ×2 IMPLANT
NS IRRIG 1000ML POUR BTL (IV SOLUTION) ×2 IMPLANT
PACK TOTAL JOINT (CUSTOM PROCEDURE TRAY) ×2 IMPLANT
PAD ARMBOARD 7.5X6 YLW CONV (MISCELLANEOUS) ×4 IMPLANT
PAD CAST 4YDX4 CTTN HI CHSV (CAST SUPPLIES) ×1 IMPLANT
PADDING CAST COTTON 4X4 STRL (CAST SUPPLIES) ×1
PADDING CAST COTTON 6X4 STRL (CAST SUPPLIES) ×2 IMPLANT
POSITIONER HEAD PRONE TRACH (MISCELLANEOUS) ×2 IMPLANT
RUBBERBAND STERILE (MISCELLANEOUS) ×2 IMPLANT
SET HNDPC FAN SPRY TIP SCT (DISPOSABLE) ×1 IMPLANT
SPONGE GAUZE 4X4 12PLY (GAUZE/BANDAGES/DRESSINGS) ×2 IMPLANT
STRIP CLOSURE SKIN 1/2X4 (GAUZE/BANDAGES/DRESSINGS) ×2 IMPLANT
SUCTION FRAZIER TIP 10 FR DISP (SUCTIONS) ×2 IMPLANT
SUT ETHIBOND NAB CT1 #1 30IN (SUTURE) ×2 IMPLANT
SUT MNCRL AB 3-0 PS2 18 (SUTURE) ×2 IMPLANT
SUT VIC AB 0 CT1 27 (SUTURE) ×2
SUT VIC AB 0 CT1 27XBRD ANBCTR (SUTURE) ×2 IMPLANT
SUT VIC AB 2-0 CT1 27 (SUTURE) ×2
SUT VIC AB 2-0 CT1 TAPERPNT 27 (SUTURE) ×2 IMPLANT
SUT VLOC 180 0 24IN GS25 (SUTURE) ×2 IMPLANT
SYR 30ML SLIP (SYRINGE) ×2 IMPLANT
TOWEL OR 17X24 6PK STRL BLUE (TOWEL DISPOSABLE) ×2 IMPLANT
TOWEL OR 17X26 10 PK STRL BLUE (TOWEL DISPOSABLE) ×2 IMPLANT
TRAY FOLEY CATH 14FR (SET/KITS/TRAYS/PACK) ×2 IMPLANT
WATER STERILE IRR 1000ML POUR (IV SOLUTION) ×2 IMPLANT

## 2012-02-28 NOTE — Anesthesia Preprocedure Evaluation (Addendum)
Anesthesia Evaluation  Patient identified by MRN, date of birth, ID band Patient awake  General Assessment Comment:Infection(candida) in groin and skin precludes block.  Reviewed: Allergy & Precautions, H&P , NPO status , Patient's Chart, lab work & pertinent test results  History of Anesthesia Complications Negative for: history of anesthetic complications  Airway Mallampati: II TM Distance: >3 FB Neck ROM: Full    Dental  (+) Poor Dentition, Dental Advisory Given and Missing   Pulmonary sleep apnea , pneumonia -,  breath sounds clear to auscultation        Cardiovascular hypertension, +CHF + dysrhythmias + Valvular Problems/Murmurs AS and AI Rhythm:Irregular Rate:Normal + Systolic murmurs    Neuro/Psych CVA    GI/Hepatic   Endo/Other  diabetes  Renal/GU Renal disease     Musculoskeletal   Abdominal   Peds  Hematology   Anesthesia Other Findings   Reproductive/Obstetrics                         Anesthesia Physical Anesthesia Plan  ASA: III  Anesthesia Plan: General   Post-op Pain Management:    Induction: Intravenous  Airway Management Planned: Oral ETT  Additional Equipment:   Intra-op Plan:   Post-operative Plan: Extubation in OR  Informed Consent:   Dental advisory given  Plan Discussed with: CRNA and Surgeon  Anesthesia Plan Comments:         Anesthesia Quick Evaluation

## 2012-02-28 NOTE — Interval H&P Note (Signed)
History and Physical Interval Note:  02/28/2012 9:12 AM  Scott Norman  has presented today for surgery, with the diagnosis of DJD RIGHT KNEE  The various methods of treatment have been discussed with the patient and family. After consideration of risks, benefits and other options for treatment, the patient has consented to  Procedure(s) (LRB) with comments: TOTAL KNEE ARTHROPLASTY (Right) as a surgical intervention .  The patient's history has been reviewed, patient examined, no change in status, stable for surgery.  I have reviewed the patient's chart and labs.  Questions were answered to the patient's satisfaction.     Salvatore Marvel A

## 2012-02-28 NOTE — H&P (View-Only) (Signed)
Scott Norman is an 78 y.o. male.   Chief Complaint: right knee pain HPI: [name] is seen for follow-up from his significant bilateral knee pain with degenerative joint disease. We postponed his previous total knee replacement on the left knee due to aspiration pneumonia and poor dentition. He's followed by Dr. Wert and Dr. Tullo. He has been cleared by his oral surgeon with complete restoration of normal dentition and no further infection. He had chest x-rays 6 weeks ago that showed some residual infiltrates and he's not had a follow-up x-rays yet. He did have a renal artery scan that showed no significant renal artery stenosis, this was done in the last 3 weeks at [facility]. Now his right knee is worse than his left.  He has been cleared by Dr Wert, Dr Tullo, and Dr Gollan for surgery  Past Medical History  Diagnosis Date  . Diabetes mellitus   . Hypertension   . Degenerative disc disease, lumbar     back surgery 2011  . Gout   . Atrial fibrillation   . PVD (peripheral vascular disease)   . Aortic stenosis   . Hyperlipidemia   . Chronic kidney disease (CKD), stage III (moderate)   . H/O colonoscopy June 2007    normal  . Screening for osteoporosis Feb 2012    T -1.3 hip  . Stroke 2012    Rmbloic, with expressive aphasia  . Renal artery stenosis, native, bilateral     s/p right RA stent 2012, pending left   . Heart murmur   . Anxiety   . Sleep apnea     does not use CPAP  . Right knee DJD     Past Surgical History  Procedure Date  . Shoulder surgery   . Lumbar spine surgery   . Renal artery stent 04/2011  . Tonsillectomy   . Appendectomy   . Throat surgery 1993    excess tissue removed to help with snoring    Family History  Problem Relation Age of Onset  . Hypertension Other   . Diabetes Other   . Pancreatic cancer Mother    Social History:  reports that he has never smoked. He has never used smokeless tobacco. He reports that he drinks about 1.2 ounces of alcohol  per week. He reports that he does not use illicit drugs.  Allergies:  Allergies  Allergen Reactions  . Ace Inhibitors     Renal insufficiency  . Beta Adrenergic Blockers     bradycardia  . Statins    Current outpatient prescriptions:amLODipine (NORVASC) 5 MG tablet, Take 1 tablet daily  as directed, Disp: 45 tablet, Rfl: 3;  chlorhexidine (PERIDEX) 0.12 % solution, Take 5 mLs by mouth Daily., Disp: , Rfl: ;  diazepam (VALIUM) 5 MG tablet, Take 0.5 tablets by mouth 2 times daily. Half a tablet with dinner and half a tablet at dinner, Disp: , Rfl:  docusate sodium (COLACE) 100 MG capsule, Take 100 mg by mouth 3 (three) times daily as needed. For stool softner, Disp: , Rfl: ;  doxazosin (CARDURA) 4 MG tablet, Take 1 tablet (4 mg total) by mouth at bedtime., Disp: 30 tablet, Rfl: 3;  fenofibrate (TRICOR) 145 MG tablet, Take 1 tablet (145 mg total) by mouth daily., Disp: 30 tablet, Rfl: 6 furosemide (LASIX) 40 MG tablet, Take 0.5 tablets (20 mg total) by mouth 3 (three) times a week., Disp: 30 tablet, Rfl: 3;  glipiZIDE (GLUCOTROL) 5 MG tablet, Take 5 mg by mouth 2 (  two) times daily before a meal. Only takes if CBG 200 or greater, Disp: , Rfl: ;  glucose blood (BAYER CONTOUR NEXT TEST) test strip, Use as instructed, Disp: 100 each, Rfl: 12 hydrALAZINE (APRESOLINE) 100 MG tablet, Take 1.5 tablets (150 mg total) by mouth 4 (four) times daily., Disp: 135 tablet, Rfl: 6;  isosorbide mononitrate (IMDUR) 60 MG 24 hr tablet, Take 60 mg by mouth daily. Take 1 tablet at bed time.  If hypertension in am then take one as needed in am, Disp: , Rfl: ;  labetalol (NORMODYNE) 100 MG tablet, Take 1 tablet (100 mg total) by mouth 2 (two) times daily as needed., Disp: 60 tablet, Rfl: 6 mirtazapine (REMERON) 7.5 mg TABS, Take 0.5 tablets (7.5 mg total) by mouth at bedtime. Increase after one week to whole tablet, Disp: 45 tablet, Rfl: 1;  nitroGLYCERIN (NITROSTAT) 0.4 MG SL tablet, Place 1 tablet (0.4 mg total) under the  tongue every 5 (five) minutes as needed for chest pain., Disp: 25 tablet, Rfl: 6 oxyCODONE-acetaminophen (ROXICET) 5-325 MG per tablet, Take 1 tablet by mouth every 8 (eight) hours as needed for pain., Disp: 90 tablet, Rfl: 0;  potassium chloride 20 MEQ/15ML (10%) solution, Take 22.5 mLs (30 mEq total) by mouth daily., Disp: 480 mL, Rfl: 0;  Sod Citrate-Citric Acid (CYTRA-2) 500-334 MG/5ML SOLN, Two Table spoons twice a day., Disp: , Rfl: ;  SPS 15 GM/60ML suspension, Take 17 g by mouth every other day as needed., Disp: , Rfl:  warfarin (COUMADIN) 5 MG tablet, Take 5 mg by mouth daily. One and a half tablets on Friday.  Adjust dose for goal INR 2.0-3.0.  Take as directed by anticoagulation clinic, Disp: , Rfl:    (Not in a hospital admission)  No results found for this or any previous visit (from the past 48 hour(s)). No results found.  Review of Systems  Constitutional: Positive for weight loss. Negative for fever, chills, malaise/fatigue and diaphoresis.  HENT: Positive for congestion. Negative for hearing loss, ear pain, nosebleeds, sore throat, neck pain, tinnitus and ear discharge.   Eyes: Negative for blurred vision, double vision, photophobia, pain, discharge and redness.  Respiratory: Negative for cough, hemoptysis, sputum production, shortness of breath, wheezing and stridor.   Cardiovascular: Negative for chest pain, palpitations, orthopnea, claudication, leg swelling and PND.  Gastrointestinal: Positive for constipation. Negative for heartburn, nausea, vomiting, abdominal pain, diarrhea, blood in stool and melena.  Genitourinary: Negative for dysuria, urgency, frequency, hematuria and flank pain.  Musculoskeletal: Positive for joint pain. Negative for myalgias, back pain and falls.       Right knee pain  Skin: Negative for itching and rash.  Neurological: Negative for dizziness, tingling, tremors, sensory change, speech change, focal weakness, seizures, loss of consciousness, weakness  and headaches.  Endo/Heme/Allergies: Positive for environmental allergies. Negative for polydipsia. Bruises/bleeds easily.  Psychiatric/Behavioral: Negative for depression, suicidal ideas, hallucinations, memory loss and substance abuse. The patient is nervous/anxious. The patient does not have insomnia.     Blood pressure 152/55, pulse 53, temperature 98.4 F (36.9 C), height 6' (1.829 m), weight 92.86 kg (204 lb 11.5 oz), SpO2 99.00%. Physical Exam  Constitutional: He is oriented to person, place, and time. He appears well-developed and well-nourished.  HENT:  Head: Normocephalic and atraumatic.  Mouth/Throat: Oropharynx is clear and moist.  Eyes: Conjunctivae normal and EOM are normal. Pupils are equal, round, and reactive to light.  Neck: Normal range of motion. Neck supple.  Cardiovascular:         Bradycardia with significant aortic stenosis murmur  Respiratory: Effort normal and breath sounds normal.  GI: Soft. Bowel sounds are normal.  Genitourinary:       Not pertinent to current symptomatology therefore not examined.  Musculoskeletal:       Examination of both knees reveals pain bilaterally 1 to 2+ crepitation 1+ synovitis pain in both knees right worse than left, range of motion is -5 to 120 degrees both knees are stable with normal patella tracking. Gait exam reveals a severe antalgic gait with pain and he walks with a cane due to this.  Neurological: He is alert and oriented to person, place, and time.  Skin: Skin is warm and dry.  Psychiatric: He has a normal mood and affect. His behavior is normal. Judgment and thought content normal.     Assessment/ Patient Active Problem List  Diagnosis  . Atrial fibrillation  . Aortic stenosis  . Hyperlipidemia  . Hypertension  . Bradycardia  . CKD (chronic kidney disease)  . Encounter for long-term (current) use of anticoagulants  . CVA (cerebrovascular accident)  . Atrial flutter  . Type II diabetes mellitus with nephropathy    . Right renal artery stenosis  . Valvular cardiomyopathy  . Screening for osteoporosis  . Knee pain, right  . Acute on chronic diastolic CHF (congestive heart failure)  . Pneumonia, organism unspecified  . Iron deficiency anemia  . Dysphagia as late effect of stroke  . Malaise  . Degenerative disc disease, lumbar  . PVD (peripheral vascular disease)  . Chronic kidney disease (CKD), stage III (moderate)  . Stroke  . Renal artery stenosis, native, bilateral  . Heart murmur  . Anxiety  . Sleep apnea    Plan Right total knee replacement.  The risks, benefits, and possible complications of the procedure were discussed in detail with the patient.  The patient is without question.  Melbourne Jakubiak J 02/17/2012, 11:12 AM    

## 2012-02-28 NOTE — Progress Notes (Signed)
Pt, ptt, bmet  Redrawn this am  0700...  da

## 2012-02-28 NOTE — Progress Notes (Signed)
Orthopedic Tech Progress Note Patient Details:  Scott Norman 10/10/1930 [id number]  CPM Right Knee CPM Right Knee: On Right Knee Flexion (Degrees): 30  Right Knee Extension (Degrees): 0    [name], [name] 02/28/2012, 1:38 PM

## 2012-02-28 NOTE — Progress Notes (Signed)
ANTICOAGULATION CONSULT NOTE - Initial Consult  Pharmacy Consult for Coumadin Indication: s/p TKA, PAF  Allergies  Allergen Reactions  . Ace Inhibitors     Renal insufficiency  . Beta Adrenergic Blockers     bradycardia  . Statins    Labs:  [facility] 02/28/12 0647  HGB --  HCT --  PLT --  APTT 41*  LABPROT 14.1  INR 1.10  HEPARINUNFRC --  CREATININE 2.16*  CKTOTAL --  CKMB --  TROPONINI --    The CrCl is unknown because both a height and weight (above a minimum accepted value) are required for this calculation.   Medical History: Past Medical History  Diagnosis Date  . Diabetes mellitus   . Degenerative disc disease, lumbar     back surgery 2011  . Gout   . Atrial fibrillation   . PVD (peripheral vascular disease)   . Aortic stenosis   . Hyperlipidemia   . Chronic kidney disease (CKD), stage III (moderate)   . H/O colonoscopy June 2007    normal  . Screening for osteoporosis Feb 2012    T -1.3 hip  . Stroke 2012    Rmbloic, with expressive aphasia  . Renal artery stenosis, native, bilateral     s/p right RA stent 2012, pending left   . Heart murmur   . Anxiety   . Sleep apnea     does not use CPAP  . Right knee DJD   . Hypertension     dr tim gollan   [facility]   . Right knee DJD 02/28/2012   Assessment: 78 year old male who presented to [facility] today for a TKA.  He was taking Coumadin PTA for PAF, but had stopped 5 days prior to surgery.  His blood pressure was elevated on arrival, but was treated.  He is now being monitored in the ICU setting.  Home dose of Coumadin = 5 mg daily except 7.5 mg Wednesday and Friday  Goal of Therapy:  INR 2-3 Monitor platelets by anticoagulation protocol: Yes   Plan:  1) Coumadin 7.5 mg po x 1 dose tonight 2) Daily INR  Thank you. [name], PharmD [phone]  02/28/2012,4:20 PM

## 2012-02-28 NOTE — Progress Notes (Signed)
37  Spoke with Dr. Thurston Hole  Regarding chest xray...the patient had CT Chest done? @ [facility], but Dr. Sherene Sires is aware of results.Marland KitchenMarland KitchenNo new order received from Dr.  Thurston Hole...Marland KitchenDA

## 2012-02-28 NOTE — Consult Note (Signed)
CARDIOLOGY CONSULT NOTE  Patient ID: Scott Norman, MRN: [id number], DOB/AGE: 1931/01/29 78 y.o. Admit date: 02/28/2012 Date of Consult: 02/28/2012  Primary Physician: Scott Dull, MD Primary Cardiologist: Dr. Mariah Norman  Reason for Consultation: Postop HTN   HPI: 78 y.o. male w/ PMHx significant for Aortic Stenosis (mild-mod), PAF (on coumadin), CVA (embolic '12 tx w/ tPA, expressive aphasia), HTN, renal artery stenosis (s/p renal stent '12), CKD (Stage III), HLD, DM, PVD, OSA, and anxiety who presented to [facility] on 02/28/2012 for Right total knee replacement.  Scott Norman was last seen in clinic by Dr. Mariah Norman 02/03/12 during which it was noted his BP has been labile with measurements ranging from 190s systolic in the evening and 110 or less in the mornings. It was noted that his elevated BP is likely related to anxiety when his caretaker leaves in the evenings. He was instructed to take 1/2 SL NTG for severe HTN as well as PRN labetolol. He was also instructed to stop coumadin 5 days before surgery and start lovenox 3 days before surgery. He was seen by his PCP on 02/14/12 where it was noted his labile BP was aggravated by anxiety and knee pain. His amlodipine was changed to 2.5mg  BID and percocet increased.   He presented to [facility] today and underwent right total knee replacement without complications. His BP on arrival was 210/67 for which he has received a total of 14mg  hydralazine and is now 160/60. Cardiology is asked to assist in management of BP.   Past Medical History  Diagnosis Date  . Diabetes mellitus   . Degenerative disc disease, lumbar     back surgery 2011  . Gout   . Atrial fibrillation   . PVD (peripheral vascular disease)   . Aortic stenosis   . Hyperlipidemia   . Chronic kidney disease (CKD), stage III (moderate)   . H/O colonoscopy June 2007    normal  . Screening for osteoporosis Feb 2012    T -1.3 hip  . Stroke 2012    Rmbloic, with expressive  aphasia  . Renal artery stenosis, native, bilateral     s/p right RA stent 2012, pending left   . Heart murmur   . Anxiety   . Sleep apnea     does not use CPAP  . Right knee DJD   . Hypertension     dr tim gollan   [facility] [place]  . Right knee DJD 02/28/2012     02/01/12 - Echo LV: cavity size normal. Mod concentric hypertrophy. EF 55-60%, no RWMAs, grade 1 diastolic dysfunction AV: mild to mod stenosis, mild regurgitation. Peak gradient . Valve area 1.56cm2 MV: Mild MR LA: mild LAE PA: PASP  Surgical History:  Past Surgical History  Procedure Date  . Shoulder surgery   . Lumbar spine surgery   . Renal artery stent 04/2011  . Tonsillectomy   . Appendectomy   . Throat surgery 1993    excess tissue removed to help with snoring  . Cardiac catheterization      Home Meds: Medication Sig  amLODipine (NORVASC) 5 MG tablet Take 5 mg by mouth daily.  chlorhexidine (PERIDEX) 0.12 % solution Use as directed 5 mLs in the mouth or throat Daily.   diazepam (VALIUM) 5 MG tablet Take 2.5 mg by mouth 2 (two) times daily before lunch and supper.   docusate sodium (COLACE) 100 MG capsule Take 100 mg by mouth 3 (three) times daily as needed. For stool softner  doxazosin (CARDURA) 4 MG tablet Take 1 tablet (4 mg total) by mouth at bedtime.  enoxaparin (LOVENOX) 100 MG/ML injection Inject 1 mL (100 mg total) into the skin daily. As instructed  fenofibrate (TRICOR) 145 MG tablet Take 1 tablet (145 mg total) by mouth daily.  furosemide (LASIX) 40 MG tablet Take 0.5 tablets (20 mg total) by mouth 3 (three) times a week.  hydrALAZINE (APRESOLINE) 100 MG tablet Take 1.5 tablets (150 mg total) by mouth 4 (four) times daily.  isosorbide mononitrate (IMDUR) 60 MG 24 hr tablet Take 60 mg by mouth See admin instructions. Take 1 tablet at bed time.  If hypertension in am then take one as needed in am  mirtazapine (REMERON) 7.5 mg TABS Take 7.5 mg by mouth at bedtime.  nitroGLYCERIN  (NITROSTAT) 0.4 MG SL tablet Place 0.4 mg under the tongue every 5 (five) minutes as needed. For chest pain  oxyCODONE-acetaminophen (PERCOCET/ROXICET) 5-325 MG per tablet Take 1 tablet by mouth every 8 (eight) hours as needed. For pain  Sod Citrate-Citric Acid (CYTRA-2) 500-334 MG/5ML SOLN Take 30 mLs by mouth 2 (two) times daily.   warfarin (COUMADIN) 5 MG tablet Take 5 mg by mouth daily. 5mg  every day except for Wednesday and Friday take 7.5mg   glipiZIDE (GLUCOTROL) 5 MG tablet Take 5 mg by mouth 2 (two) times daily before a meal. Only takes if CBG 200 or greater  glucose blood (BAYER CONTOUR NEXT TEST) test strip Use as instructed  labetalol (NORMODYNE) 100 MG tablet Take 100 mg by mouth 2 (two) times daily as needed. For hypertension  potassium chloride 20 MEQ/15ML (10%) solution Take 22.5 mLs (30 mEq total) by mouth daily.  SPS 15 GM/60ML suspension Take 17 g by mouth every other day as needed. For potassium    Inpatient Medications:   .  ceFAZolin (ANCEF) IV  2 g Intravenous 60 min Pre-Op  .  ceFAZolin (ANCEF) IV  2 g Intravenous 60 min Pre-Op  . chlorhexidine  60 mL Topical Once  . chlorhexidine  60 mL Topical Once  . chlorhexidine  60 mL Topical Once  . chlorhexidine  60 mL Topical Once  . dexamethasone  10 mg Oral Daily   Or  . dexamethasone  10 mg Intravenous Daily  . hydrALAZINE      . nystatin   Topical To OR  . povidone-iodine   Topical Once  . povidone-iodine   Topical Once   . sodium chloride    . lactated ringers    . lactated ringers 20 mL/hr at 02/28/12 1610    Allergies:  Allergies  Allergen Reactions  . Ace Inhibitors     Renal insufficiency  . Beta Adrenergic Blockers     bradycardia  . Statins     History   Social History  . Marital Status: Legally Separated    Spouse Name: N/A    Number of Children: N/A  . Years of Education: N/A   Occupational History  . retired    Social History Main Topics  . Smoking status: Never Smoker   . Smokeless  tobacco: Never Used  . Alcohol Use: 1.2 oz/week    2 Glasses of wine per week     rarely  . Drug Use: No  . Sexually Active: No   Other Topics Concern  . Not on file   Social History Narrative   Lives with second wife,  often left alone for hours on end.  ,  Meals delayed by wife's absence  from hone until 7 or 8 PM.  Daughter [name] actively involved , Ambulates with walker.      Family History  Problem Relation Age of Onset  . Hypertension Other   . Diabetes Other   . Pancreatic cancer Mother      Review of Systems: Unable to obtain due to expressive aphasia.  Labs:   02/28/2012 06:47  Prothrombin Time 14.1  INR 1.10   Lab 02/28/12 0647  NA 144  K 4.7  CL 110  CO2 27  BUN 41*  CREATININE 2.16*  CALCIUM 9.6  GLUCOSE 101*   Radiology/Studies:  None   EKG: 02/28/12 @ 1402 - sinus rhythm w/ 1st degree AVB 70bpm, RBBB  Physical Exam: Blood pressure 160/60, pulse 68, temperature 98.1 F (36.7 C), temperature source Oral, resp. rate 15, SpO2 98.00%. General: Well developed, elderly white male, in no acute distress. Head: Normocephalic, atraumatic, sclera non-icteric, no xanthomas, nares are without discharge.  Neck: Supple. (+) JVD Lungs: Clear bilaterally to auscultation without wheezes, rales, or rhonchi. Breathing is unlabored. Heart: RRR with S1 S2. 3/6 systolic ejection murmur RUSB with radiation into carotids. No rubs or gallops appreciated. Abdomen: Soft, non-tender, non-distended with normoactive bowel sounds. No rebound/guarding. No obvious abdominal masses. Msk:  Strength and tone appear normal for age. Extremities: No clubbing or cyanosis. Right leg wrapped and immobilized. Left leg with ted hose in place. Distal pedal pulses are intact and equal bilaterally. Neuro: Alert and oriented X 3. Moves all extremities spontaneously. Psych:  Normal affect.   Assessment and Plan:   78 y.o. male w/ PMHx significant for Aortic Stenosis (mild-mod), PAF (on  coumadin), CVA (embolic '12 tx w/ tPA, expressive aphasia), HTN, Left renal artery stenosis (s/p L renal stent '12), CKD (Stage III), HLD, DM, PVD, OSA, and anxiety who presented to [facility] on 02/28/2012 for Right total knee replacement  See MD notes below  Signed, HOPE, JESSICA PA-C 02/28/2012, 1:07 PM  Patient seen with PA after right TKR.  Agree with the above note.  He was hypertensive pre- and post-op, but SBP now in 150s after being dosed with hydralazine IV. He denies chest pain or dyspnea.  Telemetry shows NSR.  1. HTN: Would restart home medications when able to take po, use amlodipine 5 mg daily and hydralazine 150 mg every 8 hours.  While NPO, can get prn hydralazine IV boluses and if needed, could use NTG gtt.  Currently, with SBP in 150s, no need for IV medication.  2. Paroxysmal atrial fibrillation with history of embolic CVA.  He is in NSR.  He should go back on Lovenox bridged to coumadin when ortho determines that he would be safe for full anticoagulation. 3. Chronic diastolic CHF: Careful with IV fluid, would transition to po hydration and KVO as soon as feasible.   Marca Ancona 02/28/2012 1:56 PM

## 2012-02-28 NOTE — Anesthesia Postprocedure Evaluation (Signed)
  Anesthesia Post-op Note  Patient: Scott Norman  Procedure(s) Performed: Procedure(s) (LRB) with comments: TOTAL KNEE ARTHROPLASTY (Right)  Patient Location: PACU  Anesthesia Type: General  Level of Consciousness: awake  Airway and Oxygen Therapy: Patient Spontanous Breathing  Post-op Pain: mild  Post-op Assessment: Post-op Vital signs reviewed, Patient's Cardiovascular Status Stable and Respiratory Function Stable  Post-op Vital Signs: stable  Complications: No apparent anesthesia complications

## 2012-02-28 NOTE — Transfer of Care (Signed)
Immediate Anesthesia Transfer of Care Note  Patient: Scott Norman  Procedure(s) Performed: Procedure(s) (LRB) with comments: TOTAL KNEE ARTHROPLASTY (Right)  Patient Location: PACU  Anesthesia Type: General  Level of Consciousness: awake, alert  and oriented  Airway & Oxygen Therapy: Patient Spontanous Breathing and Patient connected to nasal cannula oxygen  Post-op Assessment: Report given to PACU RN, Post -op Vital signs reviewed and stable and Patient moving all extremities X 4  Post vital signs: Reviewed and stable  Complications: No apparent anesthesia complications

## 2012-02-28 NOTE — Op Note (Signed)
MRN:     [id number] DOB/AGE:    1930/08/26 / 78 y.o.       OPERATIVE REPORT    DATE OF PROCEDURE:  02/28/2012       PREOPERATIVE DIAGNOSIS:   DJD RIGHT KNEE      Estimated Body mass index is 27.77 kg/(m^2) as calculated from the following:   Height as of 02/03/12: 6\' 0" (1.829 m).   Weight as of 02/14/12: 204 lb 12 oz(92.874 kg).                                                        POSTOPERATIVE DIAGNOSIS:   DJD RIGHT KNEE                                                                      PROCEDURE:  Procedure(s): TOTAL KNEE ARTHROPLASTY Using Depuy Sigma RP implants #4 Femur, #5Tibia, 12.8mm sigma RP bearing, 38 Patella     SURGEON: Katalena Malveaux A    ASSISTANT:  Kirstin Shepperson PA-C   (Present and scrubbed throughout the case, critical for assistance with exposure, retraction, instrumentation, and closure.)         ANESTHESIA: GET with Femoral Nerve Block  DRAINS: foley, 2 medium hemovac in knee   TOURNIQUET TIME:   COMPLICATIONS:  None     SPECIMENS: None   INDICATIONS FOR PROCEDURE: The patient has  DJD RIGHT KNEE, varus deformities, XR shows bone on bone arthritis. Patient has failed all conservative measures including anti-inflammatory medicines, narcotics, attempts at  exercise and weight loss, cortisone injections and viscosupplementation.  Risks and benefits of surgery have been discussed, questions answered.   DESCRIPTION OF PROCEDURE: The patient identified by armband, received  right femoral nerve block and IV antibiotics, in the holding area at [facility]. Patient taken to the operating room, appropriate anesthetic  monitors were attached General endotracheal anesthesia induced with  the patient in supine position, Foley catheter was inserted. Tourniquet  applied high to the operative thigh. Lateral post and foot positioner  applied to the table, the lower extremity was then prepped and draped  in usual sterile fashion from the ankle to the  tourniquet. Time-out procedure was performed. The limb was wrapped with an Esmarch bandage and the tourniquet inflated to 365 mmHg. We began the operation by making the anterior midline incision starting at handbreadth above the patella going over the patella 1 cm medial to and  4 cm distal to the tibial tubercle. Small bleeders in the skin and the  subcutaneous tissue identified and cauterized. Transverse retinaculum was incised and reflected medially and a medial parapatellar arthrotomy was accomplished. the patella was everted and theprepatellar fat pad resected. The superficial medial collateral  ligament was then elevated from anterior to posterior along the proximal  flare of the tibia and anterior half of the menisci resected. The knee was hyperflexed exposing bone on bone arthritis. Peripheral and notch osteophytes as well as the cruciate ligaments were then resected. We continued to  work our way around posteriorly along the proximal tibia, and externally  rotated  the tibia subluxing it out from underneath the femur. A McHale  retractor was placed through the notch and a lateral Hohmann retractor  placed, and we then drilled through the proximal tibia in line with the  axis of the tibia followed by an intramedullary guide rod and 2-degree  posterior slope cutting guide. The tibial cutting guide was pinned into place  allowing resection of 8 mm of bone medially and about 2 mm of bone  laterally because of her valgus deformity. Satisfied with the tibial resection, we then  entered the distal femur 2 mm anterior to the PCL origin with the  intramedullary guide rod and applied the distal femoral cutting guide  set at 11mm, with 5 degrees of valgus. This was pinned along the  epicondylar axis. At this point, the distal femoral cut was accomplished without difficulty. We then sized for a #4 femoral component and pinned the guide in 3 degrees of external rotation.The chamfer cutting guide was  pinned into place. The anterior, posterior, and chamfer cuts were accomplished without difficulty followed by  the Sigma RP box cutting guide and the box cut. We also removed posterior osteophytes from the posterior femoral condyles. At this  time, the knee was brought into full extension. We checked our  extension and flexion gaps and found them symmetric at 12.54mm.  The patella thickness measured at 25 mm. We set the cutting guide at 15 and removed the posterior 9.5-10 mm  of the patella sized for 38 button and drilled the lollipop. The knee  was then once again hyperflexed exposing the proximal tibia. We sized for a #5 tibial base plate, applied the smokestack and the conical reamer followed by the the Delta fin keel punch. We then hammered into place the Sigma RP trial femoral component, inserted a 12.5-mm trial bearing, trial patellar button, and took the knee through range of motion from 0-130 degrees. No thumb pressure was required for patellar  tracking. At this point, all trial components were removed, a double batch of DePuy HV cement with 1500 mg of Zinacef was mixed and applied to all bony metallic mating surfaces except for the posterior condyles of the femur itself. In order, we  hammered into place the tibial tray and removed excess cement, the femoral component and removed excess cement, a 12.5-mm Sigma RP bearing  was inserted, and the knee brought to full extension with compression.  The patellar button was clamped into place, and excess cement  removed. While the cement cured the wound was irrigated out with normal saline solution pulse lavage, and medium Hemovac drains were placed.. Ligament stability and patellar tracking were checked and found to be excellent. The tourniquet was then released and hemostasis was obtained with cautery. The parapatellar arthrotomy was closed with  Z lock  suture. The subcutaneous tissue with 0 and 2-0 undyed  Vicryl suture, and 4-0 Monocryl.. A  dressing of Xeroform,  4 x 4, dressing sponges, Webril, and Ace wrap applied. Needle and sponge count were correct times 2.The patient awakened, extubated, and taken to recovery room without difficulty. Vascular status was normal, pulses 2+ and symmetric.   Scott Norman A 02/28/2012, 10:52 AM

## 2012-02-29 ENCOUNTER — Ambulatory Visit: Payer: Self-pay

## 2012-02-29 ENCOUNTER — Encounter (HOSPITAL_COMMUNITY): Payer: Self-pay | Admitting: Orthopedic Surgery

## 2012-02-29 ENCOUNTER — Ambulatory Visit: Payer: Self-pay | Admitting: Oncology

## 2012-02-29 DIAGNOSIS — E785 Hyperlipidemia, unspecified: Secondary | ICD-10-CM

## 2012-02-29 LAB — CBC
HCT: 28.5 % — ABNORMAL LOW (ref 39.0–52.0)
Hemoglobin: 9 g/dL — ABNORMAL LOW (ref 13.0–17.0)
MCH: 28.8 pg (ref 26.0–34.0)
MCHC: 31.6 g/dL (ref 30.0–36.0)

## 2012-02-29 LAB — BASIC METABOLIC PANEL
BUN: 33 mg/dL — ABNORMAL HIGH (ref 6–23)
Calcium: 8.6 mg/dL (ref 8.4–10.5)
Creatinine, Ser: 1.88 mg/dL — ABNORMAL HIGH (ref 0.50–1.35)
GFR calc non Af Amer: 32 mL/min — ABNORMAL LOW (ref >90)
Glucose, Bld: 105 mg/dL — ABNORMAL HIGH (ref 70–99)

## 2012-02-29 LAB — GLUCOSE, CAPILLARY
Glucose-Capillary: 121 mg/dL — ABNORMAL HIGH (ref 70–99)
Glucose-Capillary: 140 mg/dL — ABNORMAL HIGH (ref 70–99)

## 2012-02-29 MED ORDER — METOCLOPRAMIDE HCL 10 MG PO TABS
10.0000 mg | ORAL_TABLET | Freq: Three times a day (TID) | ORAL | Status: DC
  Administered 2012-02-29 – 2012-03-02 (×8): 10 mg via ORAL
  Filled 2012-02-29 (×13): qty 1

## 2012-02-29 MED ORDER — SODIUM CHLORIDE 0.9 % IV SOLN
INTRAVENOUS | Status: DC
  Administered 2012-02-29 – 2012-03-01 (×2): via INTRAVENOUS

## 2012-02-29 MED ORDER — AMLODIPINE BESYLATE 5 MG PO TABS
5.0000 mg | ORAL_TABLET | Freq: Two times a day (BID) | ORAL | Status: DC
  Administered 2012-03-01 – 2012-03-02 (×3): 5 mg via ORAL
  Filled 2012-02-29 (×6): qty 1

## 2012-02-29 MED ORDER — INFLUENZA VIRUS VACC SPLIT PF IM SUSP
0.5000 mL | INTRAMUSCULAR | Status: AC
  Administered 2012-03-01: 0.5 mL via INTRAMUSCULAR
  Filled 2012-02-29: qty 0.5

## 2012-02-29 MED ORDER — WARFARIN SODIUM 7.5 MG PO TABS
7.5000 mg | ORAL_TABLET | Freq: Once | ORAL | Status: AC
  Administered 2012-02-29: 7.5 mg via ORAL
  Filled 2012-02-29 (×2): qty 1

## 2012-02-29 MED ORDER — METOCLOPRAMIDE HCL 5 MG/ML IJ SOLN
5.0000 mg | Freq: Three times a day (TID) | INTRAMUSCULAR | Status: DC
  Filled 2012-02-29 (×12): qty 2

## 2012-02-29 NOTE — Progress Notes (Signed)
ARRIVED TO ROOM 6N#4, A/OX4,DENIES PAIN/NAUSEA, ORIENTED TO ROOM AND SURROUNDINGS, WIFE AT SIDE.

## 2012-02-29 NOTE — Progress Notes (Signed)
Occupational Therapy Evaluation Patient Details Name: Scott Norman MRN: [id number] DOB: [birth year] Today's Date: 02/29/2012 Time: 8295-6213 OT Time Calculation (min): 31 min  OT Assessment / Plan / Recommendation Clinical Impression  This 78 y.o. male admitted for Rt. TKA.  Pt. with communication deficits from old CVA.  Pt. currently is mod A with BADLs.  He will benefit from SNF level rehab prior to return home.  Pt. with new onset Lt. shoulder pain, and questionable weakness.  Difficulty to determine if left shoulder limitations are due to pain, or are true weakness due to communication limitations    OT Assessment  Patient needs continued OT Services    Follow Up Recommendations  Skilled nursing facility    Barriers to Discharge None    Equipment Recommendations  None recommended by OT    Recommendations for Other Services    Frequency  Min 2X/week    Precautions / Restrictions Precautions Precautions: Knee;Fall Required Braces or Orthoses: Knee Immobilizer - Right Knee Immobilizer - Right: On when out of bed or walking;Discontinue post op day 2 Restrictions Weight Bearing Restrictions: Yes RLE Weight Bearing: Weight bearing as tolerated   Pertinent Vitals/Pain     ADL  Eating/Feeding: Simulated;Minimal assistance Where Assessed - Eating/Feeding: Chair Grooming: Simulated;Teeth care;Brushing hair;Minimal assistance Where Assessed - Grooming: Supported sitting Upper Body Bathing: Simulated;Minimal assistance Where Assessed - Upper Body Bathing: Supported sitting Lower Body Bathing: Moderate assistance Where Assessed - Lower Body Bathing: Supported sit to stand Upper Body Dressing: Simulated;Minimal assistance Where Assessed - Upper Body Dressing: Unsupported sitting Lower Body Dressing: Simulated;Maximal assistance Where Assessed - Lower Body Dressing: Supported sit to stand Toilet Transfer: Simulated;Moderate assistance Toilet Transfer Method: Sit to  stand Equipment Used: Knee Immobilizer ADL Comments: Pt. able to reach forward to ankle.  Pt. with h/o Rt. shoulder limitations from previous surgery.  Lt. shoulder now with pain - appears muscular.  Pain 7/10.  Pt. reports this is new onset, and limits functional use of Lt. UE    OT Diagnosis: Generalized weakness;Acute pain  OT Problem List: Decreased strength;Decreased range of motion;Decreased activity tolerance;Decreased knowledge of use of DME or AE;Decreased knowledge of precautions;Pain;Impaired UE functional use OT Treatment Interventions: Self-care/ADL training;Therapeutic exercise;Neuromuscular education;DME and/or AE instruction;Patient/family education;Therapeutic activities   OT Goals Acute Rehab OT Goals OT Goal Formulation: With patient Time For Goal Achievement: 03/07/12 Potential to Achieve Goals: Good ADL Goals Pt Will Perform Grooming: with min assist;Standing at sink ADL Goal: Grooming - Progress: Goal set today Pt Will Perform Lower Body Dressing: with min assist;Sit to stand from chair ADL Goal: Lower Body Dressing - Progress: Goal set today Pt Will Transfer to Toilet: with min assist;Ambulation;Comfort height toilet;3-in-1 ADL Goal: Toilet Transfer - Progress: Goal set today Additional ADL Goal #1: Pt. will retrieve items at 90 degrees shoulder flexion without difficulty and pain no greater than 3/10 ADL Goal: Additional Goal #1 - Progress: Goal set today  Visit Information  Last OT Received On: 02/29/12 Assistance Needed: +2    Subjective Data  Subjective: "It hurts"  re: Lt. shoulder Patient Stated Goal: Pt unable to state   Prior Functioning     Home Living Lives With: Alone Available Help at Discharge: Skilled Nursing Facility Additional Comments: Has hired caregiver 5 days/wk 9-6 Prior Function Level of Independence: Needs assistance Needs Assistance: Meal Prep;Light Housekeeping Meal Prep: Moderate Light Housekeeping: Moderate Able to Take  Stairs?: Yes Vocation: Retired Comments: Amb with walker per chart. Communication Communication: Expressive difficulties (from prior CVA)  Dominant Hand: Right (But, Left with more mobility PTA)         Vision/Perception     Cognition  Overall Cognitive Status: Appears within functional limits for tasks assessed/performed Arousal/Alertness: Awake/alert Orientation Level: Appears intact for tasks assessed Behavior During Session: [facility] for tasks performed Cognition - Other Comments: Pt expressive aphasia but appears cognitively intact.    Extremity/Trunk Assessment Right Upper Extremity Assessment RUE ROM/Strength/Tone: Deficits RUE ROM/Strength/Tone Deficits: Active shoulder elevation limited.  AAROM to ~110 shoulder.  Elbow distally WFL RUE Coordination: WFL - fine motor Left Upper Extremity Assessment LUE ROM/Strength/Tone: Deficits LUE ROM/Strength/Tone Deficits: Pt. reports new onset pain Lt. shoulder.  ? muscular in nature.  However, pt. with only ~70-80 degrees of AROM should flex, and appears to fatigue rapidly with repetition.  Difficulty to ascertain if ROM limited PTA, how much worse it is, and if limitation due to weakness or pain.  Pt. uses Lt. UE spontaneously to lift Lt UE.  Caregiver is unsure if pt. had full shoulder ROM LUE Coordination: WFL - fine motor Right Lower Extremity Assessment RLE ROM/Strength/Tone: Deficits;Due to pain RLE ROM/Strength/Tone Deficits: poor quad set.  AAROM 20-50 degrees rt knee Left Lower Extremity Assessment LLE ROM/Strength/Tone: WFL for tasks assessed     Mobility Bed Mobility Bed Mobility: Supine to Sit;Sitting - Scoot to Edge of Bed Supine to Sit: 3: Mod assist Sitting - Scoot to Edge of Bed: 3: Mod assist Details for Bed Mobility Assistance: Assist to bring trunk up and legs over. Transfers Sit to Stand: 1: +2 Total assist;With upper extremity assist;From bed Sit to Stand: Patient Percentage: 50% Stand to Sit: 1: +2 Total  assist;With upper extremity assist;To bed Stand to Sit: Patient Percentage: 50% Details for Transfer Assistance: verbal cues for hand placement and assist to bring up hips.     Shoulder Instructions     Exercise Total Joint Exercises Heel Slides: AAROM;10 reps;Supine   Balance     End of Session OT - End of Session Equipment Utilized During Treatment: Right knee immobilizer Activity Tolerance: Patient tolerated treatment well Patient left: in chair;with family/visitor present;with call bell/phone within reach CPM Right Knee CPM Right Knee: Off Right Knee Flexion (Degrees): 30  Right Knee Extension (Degrees): 0   GO     Gail Creekmore M 02/29/2012, 11:52 AM

## 2012-02-29 NOTE — Progress Notes (Signed)
Subjective:    78 y.o. 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 Cardiology consulted for post-op management of HTN. Initially SBP 190-210 range. 145-155 overnight.   Poor historian.  SBP 150-160 overnight.  No pain.  No chest pain.  No SOB.    Objective:   Vital Signs:   Temp:  [97.3 F (36.3 C)-100.4 F (38 C)] 98.7 F (37.1 C) (10/01 0809) Pulse Rate:  [63-79] 77  (10/01 0700) Resp:  [13-22] 18  (10/01 0700) BP: (151-192)/(50-81) 172/57 mmHg (10/01 0700) SpO2:  [97 %-100 %] 98 % (10/01 0700) Arterial Line BP: (173-236)/(42-63) 194/52 mmHg (10/01 0700)    Weight change: There were no vitals filed for this visit.  Intake/Output:   Intake/Output Summary (Last 24 hours) at 02/29/12 0814 Last data filed at 02/29/12 0700  Gross per 24 hour  Intake   3365 ml  Output   1530 ml  Net   1835 ml     Physical Exam: General:  Well appearing. No resp difficulty HEENT: normal Neck: supple. JVP 9-10 . Carotids 2+ bilat; no bruits. No lymphadenopathy or thryomegaly appreciated. Cor: PMI nondisplaced. Regular rate & rhythm. No rubs, gallops or murmurs. Lungs: clear Abdomen: soft, nontender, nondistended. No hepatosplenomegaly. No bruits or masses. Good bowel sounds. Extremities: no cyanosis, clubbing, rash, edema.  Left leg Ted hose and Right leg immobilizer  Neuro: alert & orientedx3, cranial nerves grossly intact. moves all 4 extremities w/o difficulty. Affect pleasant    Labs: Basic Metabolic Panel:  Lab 02/29/12 1478 02/28/12 0647  NA 143 144  K 3.5 4.7  CL 110 110  CO2 22 27  GLUCOSE 105* 101*  BUN 33* 41*  CREATININE 1.88* 2.16*  CALCIUM 8.6 9.6  MG -- --  PHOS -- --    Liver Function  Tests: No results found for this basename: AST:5,ALT:5,ALKPHOS:5,BILITOT:5,PROT:5,ALBUMIN:5 in the last 168 hours No results found for this basename: LIPASE:5,AMYLASE:5 in the last 168 hours No results found for this basename: AMMONIA:3 in the last 168 hours  CBC:  Lab 02/29/12 0420  WBC 9.8  NEUTROABS --  HGB 9.0*  HCT 28.5*  MCV 91.1  PLT 250    Cardiac Enzymes: No results found for this basename: CKTOTAL:5,CKMB:5,CKMBINDEX:5,TROPONINI:5 in the last 168 hours  BNP: BNP (last 3 results) No results found for this basename: PROBNP:3 in the last 8760 hours    Medications:     Scheduled Medications:    . amLODipine  5 mg Oral Daily  .  ceFAZolin (ANCEF) IV  2 g Intravenous Q6H  . chlorhexidine  5 mL Mouth/Throat BID  . citric acid-sodium citrate  30 mL Oral Once  . dexamethasone  10 mg Oral Daily   Or  . dexamethasone  10 mg Intravenous Daily  . diazepam  2.5 mg Oral BID AC  . docusate sodium  100 mg Oral BID  . doxazosin  4 mg Oral QHS  . enoxaparin (LOVENOX) injection  30 mg Subcutaneous Q12H  . fenofibrate  160 mg Oral Daily  . furosemide  20 mg Oral 3 times weekly  . hydrALAZINE      . hydrALAZINE  150 mg Oral TID  . insulin aspart  0-15 Units Subcutaneous TID WC  .  insulin aspart  0-5 Units Subcutaneous QHS  . isosorbide mononitrate  60 mg Oral QHS  . mirtazapine  7.5 mg Oral QHS  . warfarin  7.5 mg Oral ONCE-1800  . Warfarin - Pharmacist Dosing Inpatient   Does not apply q1800  . DISCONTD:  ceFAZolin (ANCEF) IV  2 g Intravenous 60 min Pre-Op  . DISCONTD:  ceFAZolin (ANCEF) IV  2 g Intravenous 60 min Pre-Op  . DISCONTD: chlorhexidine  60 mL Topical Once  . DISCONTD: chlorhexidine  60 mL Topical Once  . DISCONTD: chlorhexidine  60 mL Topical Once  . DISCONTD: chlorhexidine  60 mL Topical Once  . DISCONTD: Cytra-2  30 mL Oral BID  . DISCONTD: nystatin   Topical To OR  . DISCONTD: povidone-iodine   Topical Once  . DISCONTD: povidone-iodine   Topical Once       Infusions:    . sodium chloride 100 mL/hr at 02/29/12 0100  . DISCONTD: lactated ringers    . DISCONTD: lactated ringers 20 mL/hr at 02/28/12 0839     PRN Medications:  acetaminophen, acetaminophen, diphenhydrAMINE, HYDROmorphone (DILAUDID) injection, menthol-cetylpyridinium, metoCLOPramide (REGLAN) injection, metoCLOPramide, nitroGLYCERIN, ondansetron (ZOFRAN) IV, ondansetron, oxyCODONE, phenol, DISCONTD: bupivacaine-EPINEPHrine, DISCONTD: cefUROXime, DISCONTD:  HYDROmorphone (DILAUDID) injection, DISCONTD: meperidine (DEMEROL) injection, DISCONTD: oxyCODONE, DISCONTD: oxyCODONE, DISCONTD: promethazine  DISCONTD: sodium chloride irrigation   Assessment:   1. Hypertension 2. PAF 3. History of embolic CVA 4. Chronic diastolic HF 5. DJD Right knee s/p right total knee on 9/30  Plan/Discussion:    Patient with long history of difficult to control hypertension.  SBP improving throughout the night, 150-160s.  He restarted po meds last night.  Will continue with current regimen.  Per patient afternoon SBP increases 200, will monitor closely.  He has prn labetolol at home but is unsure if he takes this medication or not as his caregiver provides his meds (not here to discuss).  Will also follow fluid intake closely as patient has diastolic HF.    Length of Stay: 1  Robbi Garter, [facility] 02/29/2012, 8:14 AM  Patient seen and examined with Ulyess Blossom, PA-C. We discussed all aspects of the encounter. I agree with the assessment and plan as stated above. BP improving will increase amlodipine to 5 bid. Will follow at a distance.   Daniel Bensimhon,MD 3:19 PM

## 2012-02-29 NOTE — Progress Notes (Signed)
ANTICOAGULATION CONSULT NOTE - Initial Consult  Pharmacy Consult for Coumadin Indication: s/p TKA, PAF  Allergies  Allergen Reactions  . Ace Inhibitors     Renal insufficiency  . Beta Adrenergic Blockers     bradycardia  . Statins    Labs:  [facility] 02/29/12 0420 02/28/12 0647  HGB 9.0* --  HCT 28.5* --  PLT 250 --  APTT -- 41*  LABPROT 15.8* 14.1  INR 1.29 1.10  HEPARINUNFRC -- --  CREATININE 1.88* 2.16*  CKTOTAL -- --  CKMB -- --  TROPONINI -- --    The CrCl is unknown because both a height and weight (above a minimum accepted value) are required for this calculation.   Medical History: Past Medical History  Diagnosis Date  . Diabetes mellitus   . Degenerative disc disease, lumbar     back surgery 2011  . Gout   . Atrial fibrillation   . PVD (peripheral vascular disease)   . Aortic stenosis   . Hyperlipidemia   . Chronic kidney disease (CKD), stage III (moderate)   . H/O colonoscopy June 2007    normal  . Screening for osteoporosis Feb 2012    T -1.3 hip  . Stroke 2012    Rmbloic, with expressive aphasia  . Renal artery stenosis, native, bilateral     s/p right RA stent 2012, pending left   . Heart murmur   . Anxiety   . Sleep apnea     does not use CPAP  . Right knee DJD   . Hypertension     dr tim gollan   [facility] [place]  . Right knee DJD 02/28/2012   Assessment: 78 year old male who presented to [facility] today for a TKA.  He was taking Coumadin PTA for PAF, but had stopped 5 days prior to surgery. INR 1.29 today.   Home dose of Coumadin = 5 mg daily except 7.5 mg Wednesday and Friday  Goal of Therapy:  INR 2-3 Monitor platelets by anticoagulation protocol: Yes   Plan:  1) Coumadin 7.5 mg po x 1 dose tonight 2) Daily INR

## 2012-02-29 NOTE — Care Management Note (Unsigned)
    Page 1 of 1   02/29/2012     2:11:48 PM   CARE MANAGEMENT NOTE 02/29/2012  Patient:  Scott Norman, Scott Norman   Account Number:  [id number]  Date Initiated:  02/29/2012  Documentation initiated by:  Nakisha Chai  Subjective/Objective Assessment:   PT S/P RT TOTAL KNEE ARTHROPLASTY ON 02/28/12.  PTA, PT FAIRLY INDEPENDENT, LIVES ALONE; HAS CAREGIVER 5 DAYS/WEEK FROM 9A-6PM.  PT HAS EXPRESSIVE APHASIA FROM CVA.     Action/Plan:   MET WITH PT'S CARETAKER TO DISCUSS DC PLANS.  SHE STATES THAT ARRANGEMENTS HAVE BEEN MADE FOR PT TO DC TO [facility] IN  FOR REHAB.  WILL CONSULT CSW TO ASSIST WITH FACILITATING THIS PLAN.  WILL FOLLOW.   Anticipated DC Date:  03/04/2012   Anticipated DC Plan:  SKILLED NURSING FACILITY  In-house referral  Clinical Social Worker      DC Planning Services  CM consult      Choice offered to / List presented to:             Status of service:  In process, will continue to follow Medicare Important Message given?   (If response is "NO", the following Medicare IM given date fields will be blank) Date Medicare IM given:   Date Additional Medicare IM given:    Discharge Disposition:    Per UR Regulation:  Reviewed for med. necessity/level of care/duration of stay  If discussed at Long Length of Stay Meetings, dates discussed:    Comments:

## 2012-02-29 NOTE — Progress Notes (Signed)
Physical Therapy Treatment Patient Details Name: Scott Norman MRN: [id number] DOB: [birth year] Today's Date: 02/29/2012 Time: 4098-1191 PT Time Calculation (min): 39 min  PT Assessment / Plan / Recommendation Comments on Treatment Session  Pt s/p rt TKA.  Pt more fatigued for PM session but rt. knee in better position in chair and bed.    Follow Up Recommendations  Post acute inpatient rehab    Barriers to Discharge        Equipment Recommendations  None recommended by PT    Recommendations for Other Services    Frequency 7X/week   Plan Discharge plan remains appropriate;Frequency remains appropriate    Precautions / Restrictions Precautions Precautions: Knee;Fall Required Braces or Orthoses: Knee Immobilizer - Right Knee Immobilizer - Right: On when out of bed or walking;Discontinue post op day 2 Restrictions Weight Bearing Restrictions: Yes RLE Weight Bearing: Weight bearing as tolerated   Pertinent Vitals/Pain VSS    Mobility  Bed Mobility Bed Mobility: Sit to Supine Sit to Supine: 1: +2 Total assist Sit to Supine: Patient Percentage: 30% Details for Bed Mobility Assistance: Assist to lower trunk and bring legs up. Transfers Sit to Stand: 1: +2 Total assist;With armrests;With upper extremity assist Sit to Stand: Patient Percentage: 60% Stand to Sit: 1: +2 Total assist;To bed;With upper extremity assist Stand to Sit: Patient Percentage: 50% Details for Transfer Assistance: verbal cues for hand placement Ambulation/Gait Ambulation/Gait Assistance: 1: +2 Total assist Ambulation/Gait: Patient Percentage: 50% Ambulation Distance (Feet): 2 Feet Assistive device: Rolling walker Ambulation/Gait Assistance Details: verbal cues and manual facilitation to stand more erect and keep rt knee extended. Gait Pattern: Step-to pattern;Decreased step length - left;Decreased stance time - right;Decreased weight shift to right;Antalgic;Trunk flexed Gait velocity: very,very  slow General Gait Details: Spoke with pt and caregiver about importance of positioning knee in extension and neutral rotation.  Also spoke about quad sets and use of knee immobilizer.    Exercises Total Joint Exercises Ankle Circles/Pumps: AROM;Both;15 reps;Supine Quad Sets: AAROM;10 reps;Supine (poor rt quad set) Knee Flexion: AAROM;Right;5 reps;Seated Goniometric ROM: 10-75 degrees in sitting with assist   PT Diagnosis:    PT Problem List:   PT Treatment Interventions:     PT Goals Acute Rehab PT Goals PT Goal: Sit to Stand - Progress: Progressing toward goal PT Goal: Stand to Sit - Progress: Progressing toward goal PT Goal: Ambulate - Progress: Progressing toward goal  Visit Information  Last PT Received On: 02/29/12 Assistance Needed: +2    Subjective Data  Subjective: "Thank you."   Cognition  Overall Cognitive Status: Appears within functional limits for tasks assessed/performed Arousal/Alertness: Awake/alert Orientation Level: Appears intact for tasks assessed Behavior During Session: [facility] for tasks performed Cognition - Other Comments: Pt expressive aphasia but appears cognitively intact.    Balance     End of Session PT - End of Session Equipment Utilized During Treatment: Gait belt;Right knee immobilizer Activity Tolerance: Patient limited by fatigue;Patient limited by pain Patient left: in bed;with call bell/phone within reach;with family/visitor present Nurse Communication: Mobility status CPM Right Knee CPM Right Knee: Off   GP     Denee Boeder 02/29/2012, 2:45 PM  [facility] PT [phone]

## 2012-02-29 NOTE — Evaluation (Signed)
Physical Therapy Evaluation Patient Details Name: Scott Norman MRN: [id number] DOB: [birth year] Today's Date: 02/29/2012 Time: 7829-5621 PT Time Calculation (min): 35 min  PT Assessment / Plan / Recommendation Clinical Impression  Pt s/p rt TKA.  Expect pt to make slow steady progress.  Will need ST-SNF.    PT Assessment  Patient needs continued PT services    Follow Up Recommendations  Post acute inpatient rehab    Barriers to Discharge Decreased caregiver support      Equipment Recommendations  None recommended by PT    Recommendations for Other Services     Frequency 7X/week    Precautions / Restrictions Precautions Precautions: Knee;Fall Required Braces or Orthoses: Knee Immobilizer - Right Knee Immobilizer - Right: On when out of bed or walking;Discontinue post op day 2 Restrictions Weight Bearing Restrictions: Yes RLE Weight Bearing: Weight bearing as tolerated   Pertinent Vitals/Pain Rt knee pain.      Mobility  Bed Mobility Bed Mobility: Supine to Sit;Sitting - Scoot to Edge of Bed Supine to Sit: 3: Mod assist Sitting - Scoot to Edge of Bed: 3: Mod assist Details for Bed Mobility Assistance: Assist to bring trunk up and legs over. Transfers Transfers: Sit to Stand;Stand to Sit Sit to Stand: 1: +2 Total assist;With upper extremity assist;From bed Sit to Stand: Patient Percentage: 50% Stand to Sit: 1: +2 Total assist;With upper extremity assist;To bed Stand to Sit: Patient Percentage: 50% Details for Transfer Assistance: verbal cues for hand placement and assist to bring up hips. Ambulation/Gait Ambulation/Gait Assistance: 3: Mod assist (+1 for lines) Ambulation Distance (Feet): 12 Feet Assistive device: Rolling walker Ambulation/Gait Assistance Details: verbal cues and manual facilitation to stand more erect and to keep rt knee extended. Gait Pattern: Step-to pattern;Decreased stance time - right;Right flexed knee in stance;Decreased step length -  right;Decreased step length - left;Trunk flexed Gait velocity: very, very slow    Shoulder Instructions     Exercises Total Joint Exercises Heel Slides: AAROM;10 reps;Supine   PT Diagnosis: Difficulty walking;Acute pain  PT Problem List: Decreased range of motion;Decreased knowledge of use of DME;Decreased strength;Decreased activity tolerance;Decreased mobility;Pain;Decreased knowledge of precautions PT Treatment Interventions: DME instruction;Gait training;Functional mobility training;Patient/family education;Therapeutic activities;Therapeutic exercise   PT Goals Acute Rehab PT Goals PT Goal Formulation: With patient Time For Goal Achievement: 03/07/12 Potential to Achieve Goals: Good Pt will go Supine/Side to Sit: with supervision PT Goal: Supine/Side to Sit - Progress: Goal set today Pt will go Sit to Supine/Side: with supervision PT Goal: Sit to Supine/Side - Progress: Goal set today Pt will go Sit to Stand: with supervision PT Goal: Sit to Stand - Progress: Goal set today Pt will go Stand to Sit: with supervision PT Goal: Stand to Sit - Progress: Goal set today Pt will Ambulate: 51 - 150 feet;with supervision;with least restrictive assistive device PT Goal: Ambulate - Progress: Goal set today  Visit Information  Last PT Received On: 02/29/12 Assistance Needed: +2    Subjective Data  Subjective: "Thank you," pt stated as I was leaving.  Pt with expressive aphasia from CVA. Patient Stated Goal: Pt unable to state due to aphasia.   Prior Functioning  Home Living Available Help at Discharge: Skilled Nursing Facility Additional Comments: Pt unable to state due to aphasia.  Pt plan is for ST-SNF. Prior Function Vocation: Retired Comments: Amb with walker per chart. Communication Communication: Expressive difficulties    Cognition  Overall Cognitive Status: Appears within functional limits for tasks assessed/performed Arousal/Alertness: Awake/alert Behavior  During  Session: [facility] for tasks performed Cognition - Other Comments: Pt expressive aphasia but appears cognitively intact.    Extremity/Trunk Assessment Right Lower Extremity Assessment RLE ROM/Strength/Tone: Deficits;Due to pain RLE ROM/Strength/Tone Deficits: poor quad set.  AAROM 20-50 degrees rt knee Left Lower Extremity Assessment LLE ROM/Strength/Tone: WFL for tasks assessed   Balance    End of Session PT - End of Session Equipment Utilized During Treatment: Gait belt;Right knee immobilizer Activity Tolerance: Patient limited by fatigue;Patient limited by pain Patient left: in chair;with call bell/phone within reach Nurse Communication: Mobility status CPM Right Knee CPM Right Knee: Off Right Knee Flexion (Degrees): 30  Right Knee Extension (Degrees): 0   GP     Summit Borchardt 02/29/2012, 9:46 AM  [facility] PT [phone]

## 2012-02-29 NOTE — Progress Notes (Signed)
Clinical Social Work Department BRIEF PSYCHOSOCIAL ASSESSMENT 02/29/2012  Patient:  Scott Norman, Scott Norman     Account Number:  [id number]     Admit date:  02/28/2012  Clinical Social Worker:  Conley Simmonds  Date/Time:  02/29/2012 03:00 PM  Referred by:  Physician  Date Referred:  02/29/2012 Referred for  SNF Placement   Other Referral:   Interview type:  Family Other interview type:    PSYCHOSOCIAL DATA Living Status:  FAMILY Admitted from facility:   Level of care:   Primary support name:  Scott Norman Primary support relationship to patient:  CHILD, ADULT Degree of support available:   Strong    CURRENT CONCERNS Current Concerns  Post-Acute Placement   Other Concerns:    SOCIAL WORK ASSESSMENT / PLAN Per CM CSW contacted pt dtr (who is pt primary caregiver at home)by phone to discuss d/c planning-  Pt had panned total knee replacement and has chosen [name] for rehab-pt has participated in [name] with [name] in the past and dtr familiar with d/c planning process.  CSW will be initiating SNF search and follow for d/c planning   Assessment/plan status:  Psychosocial Support/Ongoing Assessment of Needs Other assessment/ plan:   Information/referral to community resources:   [employer]    PATIENT'S/FAMILY'S RESPONSE TO PLAN OF CARE: Pt dtr very involved in pt care and agreeable to SNF-Pt and family have been happy with Rehab at [facility] in the past following a stroke-Pt dtr requested CSW arrange PTAR transportation home as she is concened with pt medical needs in care-  CSW will follow and facilitate d/c to SNF-  Scott Norman, [phone]

## 2012-02-29 NOTE — Progress Notes (Signed)
Patient ID: Scott Norman, male   DOB: 03-01-1931, 78 y.o.   MRN: [id number] PATIENT ID: Scott Norman  MRN: [id number]  DOB/AGE:  April 01, 1931 / 78 y.o.  1 Day Post-Op Procedure(s) (LRB): TOTAL KNEE ARTHROPLASTY (Right)    PROGRESS NOTE Subjective: Patient is alert, oriented, no Nausea, no Vomiting, yes passing gas, no Bowel Movement. Taking PO well. Denies SOB, Chest or Calf Pain. Using Incentive Spirometer, PAS in place. Ambulate 25 feet 2+ assist, CPM 0-60 Patient reports pain as 8 on 0-10 scale  .    Objective: Vital signs in last 24 hours: Filed Vitals:   02/29/12 0700 02/29/12 0800 02/29/12 0809 02/29/12 0902  BP: 172/57 150/56    Pulse: 77 71  75  Temp:   98.7 F (37.1 C)   TempSrc:   Oral   Resp: 18 16    SpO2: 98% 97%  100%      Intake/Output from previous day: I/O last 3 completed shifts: In: 3365 [P.O.:340; I.V.:2925; IV Piggyback:100] Out: 1530 [Urine:1400; Drains:130]   Intake/Output this shift: Total I/O In: 200 [I.V.:200] Out: -    LABORATORY DATA:  Basename 02/29/12 0734 02/29/12 0420 02/28/12 2324 02/28/12 1550 02/28/12 0647  WBC -- 9.8 -- -- --  HGB -- 9.0* -- -- --  HCT -- 28.5* -- -- --  PLT -- 250 -- -- --  NA -- 143 -- -- 144  K -- 3.5 -- -- 4.7  CL -- 110 -- -- 110  CO2 -- 22 -- -- 27  BUN -- 33* -- -- 41*  CREATININE -- 1.88* -- -- 2.16*  GLUCOSE -- 105* -- -- 101*  GLUCAP 97 -- 103* 90 --  INR -- 1.29 -- -- 1.10  CALCIUM -- 8.6 -- -- --    Examination: Neurologically intact ABD soft Neurovascular intact Sensation intact distally Intact pulses distally Dorsiflexion/Plantar flexion intact Incision: dressing C/D/I}  Assessment:   1 Day Post-Op Procedure(s) (LRB): TOTAL KNEE ARTHROPLASTY (Right) ADDITIONAL DIAGNOSIS:  Acute Blood Loss Anemia Principal Problem:  *Right knee DJD Active Problems:  Atrial fibrillation  Aortic stenosis  Hyperlipidemia  Hypertension  Bradycardia  CKD (chronic kidney disease)  CVA (cerebrovascular  accident)  Atrial flutter  Type II diabetes mellitus with nephropathy  Right renal artery stenosis  Valvular cardiomyopathy  Knee pain, right  Acute on chronic diastolic CHF (congestive heart failure)  Iron deficiency anemia  Dysphagia as late effect of stroke  Degenerative disc disease, lumbar  PVD (peripheral vascular disease)  Chronic kidney disease (CKD), stage III (moderate)  Stroke  Anxiety  Sleep apnea   Plan: PT/OT WBAT, CPM 5/hrs day until ROM 0-90 degrees, then D/C CPM DVT Prophylaxis:  SCDx72hrs, ASA 325 mg BID x 2 weeks DISCHARGE PLAN: Skilled Nursing Facility/Rehab DISCHARGE NEEDS: transfer to telebed on [address]. Stable after PT today   D/C foley slow down IV fluids     Toron Bowring J 02/29/2012, 9:30 AM

## 2012-03-01 DIAGNOSIS — D62 Acute posthemorrhagic anemia: Secondary | ICD-10-CM | POA: Diagnosis not present

## 2012-03-01 LAB — CBC
HCT: 25.5 % — ABNORMAL LOW (ref 39.0–52.0)
MCH: 29.5 pg (ref 26.0–34.0)
MCHC: 32.5 g/dL (ref 30.0–36.0)
RDW: 15.8 % — ABNORMAL HIGH (ref 11.5–15.5)

## 2012-03-01 LAB — BASIC METABOLIC PANEL
CO2: 24 mEq/L (ref 19–32)
Creatinine, Ser: 2.01 mg/dL — ABNORMAL HIGH (ref 0.50–1.35)
Glucose, Bld: 102 mg/dL — ABNORMAL HIGH (ref 70–99)
Potassium: 4.4 mEq/L (ref 3.5–5.1)

## 2012-03-01 LAB — GLUCOSE, CAPILLARY: Glucose-Capillary: 175 mg/dL — ABNORMAL HIGH (ref 70–99)

## 2012-03-01 LAB — PROTIME-INR
INR: 1.94 — ABNORMAL HIGH (ref 0.00–1.49)
Prothrombin Time: 21.4 seconds — ABNORMAL HIGH (ref 11.6–15.2)

## 2012-03-01 LAB — PREPARE RBC (CROSSMATCH)

## 2012-03-01 MED ORDER — SORBITOL 70 % SOLN
30.0000 mL | Freq: Two times a day (BID) | Status: DC
  Administered 2012-03-01 – 2012-03-02 (×2): 30 mL via ORAL
  Filled 2012-03-01 (×4): qty 30

## 2012-03-01 MED ORDER — WARFARIN SODIUM 3 MG PO TABS
3.0000 mg | ORAL_TABLET | Freq: Once | ORAL | Status: AC
  Administered 2012-03-01: 3 mg via ORAL
  Filled 2012-03-01: qty 1

## 2012-03-01 NOTE — Progress Notes (Addendum)
Clinical Social Work Department CLINICAL SOCIAL WORK PLACEMENT NOTE 03/01/2012  Patient:  Scott Norman, Scott Norman  Account Number:  [id number] Admit date:  02/28/2012  Clinical Social Worker:  Unk Lightning, LCSW  Date/time:  03/01/2012 01:00 PM  Clinical Social Work is seeking post-discharge placement for this patient at the following level of care:   SKILLED NURSING   (*CSW will update this form in Epic as items are completed)   02/29/2012  Patient/family provided with [facility] Department of Clinical Social Work's list of facilities offering this level of care within the geographic area requested by the patient (or if unable, by the patient's family).  02/29/2012  Patient/family informed of their freedom to choose among providers that offer the needed level of care, that participate in Medicare, Medicaid or managed care program needed by the patient, have an available bed and are willing to accept the patient.  02/29/2012  Patient/family informed of [facility]' ownership interest in [facility], as well as of the fact that they are under no obligation to receive care at this facility.  PASARR submitted to EDS on existing # PASARR number received from EDS on   FL2 transmitted to all facilities in geographic area requested by pt/family on  02/29/2012 FL2 transmitted to all facilities within larger geographic area on   Patient informed that his/her managed care company has contracts with or will negotiate with  certain facilities, including the following:     Patient/family informed of bed offers received:  03/01/2012 Patient chooses bed at [facility] Physician recommends and patient chooses bed at    Patient to be transferred to [facility] on  03/02/2012 Patient to be transferred to facility by [facility]  The following physician request were entered in Epic:   Additional Comments:

## 2012-03-01 NOTE — Progress Notes (Addendum)
Occupational Therapy Treatment Patient Details Name: Scott Norman MRN: [id number] DOB: 15-Aug-1930 Today's Date: 03/01/2012 Time: 4098-1191 OT Time Calculation (min): 10 min  OT Assessment / Plan / Recommendation Comments on Treatment Session This 78 yo male reports that the heat did help his left shoulder as evidenced by no c/o pain and increased smoothness of movement with AAROM (from starting point over prior treatment session).    Follow Up Recommendations  Skilled nursing facility       Equipment Recommendations  None recommended by PT;None recommended by OT       Frequency Min 2X/week   Plan Discharge plan remains appropriate    Precautions / Restrictions Precautions Precautions: Fall;Knee Precaution Comments: L shoulder painful to move and palpate Required Braces or Orthoses: Knee Immobilizer - Right Knee Immobilizer - Right: Discontinue post op day 2;On when out of bed or walking Restrictions Weight Bearing Restrictions: No RLE Weight Bearing: Weight bearing as tolerated   Pertinent Vitals/Pain 0/10 left shoulder      OT Goals ADL Goals ADL Goal: Additional Goal #1 - Progress: Progressing toward goals  Visit Information  Last OT Received On: 03/01/12 Assistance Needed: +1 (in bed)    Subjective Data  Subjective: It does feel better now (post heat, had increased ROM (AA/P) as well as report of decreased pain      Cognition  Overall Cognitive Status: History of cognitive impairments - at baseline Arousal/Alertness: Awake/alert Orientation Level: Other (comment) (difficult to assess with aphasia) Behavior During Session: [facility] for tasks performed Cognition - Other Comments: difficulty to fully assess cognition, pt not wanting therapy today and wanting to be left alone       Exercises  Other Exercises Other Exercises: Back in to assess patient after heat had been applied at least 10 minutes. Pt with increased AAROM at beginning of range up to 90 degrees  supine in bed. No grimacing, whincing, increased effort as prior treatment today. Also had him raise arm (AAROM) to 90 degrees and work on horizontal abduction and adduction without c/o pain. Encouraged pt that after blood was finished to try and move that shoulder and that his caregiver could as for another hot pack, apply for 10 minutes making sure that there is some material between him and the hot pack then after this she could help him with the exercises again. Also told her she could do it tomorrow when she gets here. Caregiver reports that the patien has a microwavable heat pack at home, encouaged her to find it and use at the SNF for his shoulder.      End of Session OT - End of Session Equipment Utilized During Treatment:  (None, only worked with arm) Activity Tolerance: Patient tolerated treatment well Patient left: in bed;with family/visitor present (caregiver from home)       Evette Georges [phone] 03/01/2012, 3:03 PM

## 2012-03-01 NOTE — Progress Notes (Signed)
Physical Therapy Treatment Patient Details Name: Scott Norman MRN: [id number] DOB: [birth year] Today's Date: 03/01/2012 Time: 6295-2841 PT Time Calculation (min): 54 min  PT Assessment / Plan / Recommendation Comments on Treatment Session  Pt very agitated this morning and only minimally agreeable to PT.  Helped him transfer to and from [facility] for bowel mvmt but pt unable to ambulate due to pain with weight bearing on the RLE. Pt left in CPM -1-30 deg. PT will continue to follow.    Follow Up Recommendations  Post acute inpatient rehab    Barriers to Discharge        Equipment Recommendations  None recommended by PT    Recommendations for Other Services    Frequency 7X/week   Plan Discharge plan remains appropriate;Frequency remains appropriate    Precautions / Restrictions Precautions Precautions: Knee;Fall Required Braces or Orthoses: Knee Immobilizer - Right Knee Immobilizer - Right: On when out of bed or walking;Discontinue post op day 2 Restrictions Weight Bearing Restrictions: No RLE Weight Bearing: Weight bearing as tolerated   Pertinent Vitals/Pain 6/10 right knee pain, faces scale, repositioned and RN aware    Mobility  Bed Mobility Bed Mobility: Rolling Left;Left Sidelying to Sit;Sitting - Scoot to [place] of Bed Rolling Left: 3: Mod assist Left Sidelying to Sit: 3: Mod assist Sitting - Scoot to Edge of Bed: 2: Max assist Sit to Supine: 1: +2 Total assist Sit to Supine: Patient Percentage: 30% Details for Bed Mobility Assistance: assist of one at the trunk and one at LE's and then total assost to help pt scoot up in bed, all mvmts painful today Transfers Transfers: Sit to Stand;Stand to Sit;Stand Pivot Transfers Sit to Stand: 1: +2 Total assist;From bed;With upper extremity assist Sit to Stand: Patient Percentage: 50% Stand to Sit: 1: +2 Total assist;To chair/3-in-1;With upper extremity assist Stand to Sit: Patient Percentage: 60% Stand Pivot Transfers: 1: +2 Total  assist Stand Pivot Transfers: Patient Percentage: 50% Details for Transfer Assistance: pt very frustrated with all activity this morning, wanting to get to the bedside commode but cursing at therapist as we are helping him, vc's for wt shifting and trunk extension.  Pt much calmer after transfer back into bed and settled  Ambulation/Gait Ambulation/Gait Assistance: Not tested (comment) (due to pain and mental state) Stairs: No Wheelchair Mobility Wheelchair Mobility: No    Exercises     PT Diagnosis:    PT Problem List:   PT Treatment Interventions:     PT Goals Acute Rehab PT Goals PT Goal Formulation: With patient Time For Goal Achievement: 03/07/12 Potential to Achieve Goals: Fair Pt will go Supine/Side to Sit: with supervision PT Goal: Supine/Side to Sit - Progress: Progressing toward goal Pt will go Sit to Supine/Side: with supervision PT Goal: Sit to Supine/Side - Progress: Progressing toward goal Pt will go Sit to Stand: with supervision PT Goal: Sit to Stand - Progress: Progressing toward goal Pt will go Stand to Sit: with supervision PT Goal: Stand to Sit - Progress: Progressing toward goal Pt will Ambulate: 51 - 150 feet;with supervision;with least restrictive assistive device PT Goal: Ambulate - Progress: Not progressing  Visit Information  Last PT Received On: 03/01/12 Assistance Needed: +2    Subjective Data  Subjective: pt very agitated and cursing this morning with mvmt Patient Stated Goal: Pt unable to state due to aphasia.   Cognition  Overall Cognitive Status: History of cognitive impairments - at baseline Arousal/Alertness: Lethargic Orientation Level: Other (comment) (difficult to assess with  aphasia) Behavior During Session: Agitated Cognition - Other Comments: difficulty to fully assess cognition, pt not wanting therapy today and wanting to be left alone    Balance  Balance Balance Assessed: Yes Static Sitting Balance Static Sitting - Balance  Support: Bilateral upper extremity supported;Feet supported Static Sitting - Level of Assistance: 5: Stand by assistance (left lean)  End of Session PT - End of Session Equipment Utilized During Treatment: Gait belt;Right knee immobilizer Activity Tolerance: Patient limited by pain;Treatment limited secondary to agitation Patient left: in bed;in CPM;with call bell/phone within reach;with family/visitor present (CPM -1-30 degrees) Nurse Communication: Mobility status   GP   Lyanne Co, PT  Acute Rehab Services  [phone]   Lyanne Co 03/01/2012, 2:39 PM

## 2012-03-01 NOTE — Progress Notes (Signed)
Patient ID: Scott Norman, male   DOB: 18-Jan-1931, 78 y.o.   MRN: [id number] PATIENT ID: Scott Norman  MRN: [id number]  DOB/AGE:  December 12, 1930 / 78 y.o.  2 Days Post-Op Procedure(s) (LRB): TOTAL KNEE ARTHROPLASTY (Right)    PROGRESS NOTE Subjective: Patient is alert, oriented, no Nausea, no Vomiting, yes passing gas, no Bowel Movement. Taking PO yes. Denies SOB, Chest or Calf Pain. Using Incentive Spirometer, PAS in place. Ambulate minimally with 2+ assist, CPM 0-60 Patient reports pain as 4 on 0-10 scale  .    Objective: Vital signs in last 24 hours: Filed Vitals:   02/29/12 2336 03/01/12 0000 03/01/12 0135 03/01/12 0547  BP: 157/57  154/54 161/55  Pulse: 74  69 67  Temp:   99.7 F (37.6 C) 99.4 F (37.4 C)  TempSrc:   Oral Oral  Resp:  16 18 18   Height:      Weight:      SpO2:   97% 95%      Intake/Output from previous day: I/O last 3 completed shifts: In: 3393 [P.O.:780; I.V.:2563; IV Piggyback:50] Out: 1705 [Urine:1575; Drains:130]   Intake/Output this shift:     LABORATORY DATA:  Basename 03/01/12 0545 02/29/12 2119 02/29/12 1707 02/29/12 1152 02/29/12 0420  WBC 9.2 -- -- -- 9.8  HGB 8.3* -- -- -- 9.0*  HCT 25.5* -- -- -- 28.5*  PLT 209 -- -- -- 250  NA 142 -- -- -- 143  K 4.4 -- -- -- 3.5  CL 110 -- -- -- 110  CO2 24 -- -- -- 22  BUN 33* -- -- -- 33*  CREATININE 2.01* -- -- -- 1.88*  GLUCOSE 102* -- -- -- 105*  GLUCAP -- 140* 162* 121* --  INR 1.94* -- -- -- 1.29  CALCIUM 9.0 -- -- -- --    Examination: Neurologically intact ABD soft Neurovascular intact Sensation intact distally Intact pulses distally Dorsiflexion/Plantar flexion intact Incision: scant drainage}  Assessment:   2 Days Post-Op Procedure(s) (LRB): TOTAL KNEE ARTHROPLASTY (Right) ADDITIONAL DIAGNOSIS:   Principal Problem:  *Right knee DJD Active Problems:  Atrial fibrillation  Aortic stenosis  Hyperlipidemia  Hypertension  Bradycardia  CKD (chronic kidney disease)  CVA  (cerebrovascular accident)  Atrial flutter  Type II diabetes mellitus with nephropathy  Right renal artery stenosis  Valvular cardiomyopathy  Knee pain, right  Acute on chronic diastolic CHF (congestive heart failure)  Iron deficiency anemia  Dysphagia as late effect of stroke  Degenerative disc disease, lumbar  PVD (peripheral vascular disease)  Chronic kidney disease (CKD), stage III (moderate)  Stroke  Anxiety  Sleep apnea  Postoperative anemia due to acute blood loss  Plan: PT/OT WBAT, CPM 5/hrs day until ROM 0-90 degrees,  DVT Prophylaxis:  SCDx72hrs, coumadin with lovenox bridge DISCHARGE PLAN: Skilled Nursing Facility/Rehab DISCHARGE NEEDS: skilled nursing [facility] and transfuse 2 units of packed red cells for his acute postoperative blood loss anemia with 20 mg lasix IV between units.  NO PO LASIX TODAY.  Plan on SNF tomorrow    Melvern Ramone J 03/01/2012, 7:55 AM

## 2012-03-01 NOTE — Progress Notes (Signed)
Physical Therapy Treatment Patient Details Name: Scott Norman MRN: [id number] DOB: April 02, 1931 Today's Date: 03/01/2012 Time: 4098-1191 PT Time Calculation (min): 10 min  PT Assessment / Plan / Recommendation Comments on Treatment Session  Pt more agreeable to PT this afternoon but said that he was in pain and did not want to get OOB, given that pt was so agitated this morning and still getting blood, help ambulation until tomorrow morning.  Pt is going to need max encouragement to mobilize OOB.  Pt tolerated ther ex well and stayed in CPM several hours before that. PT will continue to follow.    Follow Up Recommendations  Post acute inpatient rehab    Barriers to Discharge        Equipment Recommendations  None recommended by PT;None recommended by OT    Recommendations for Other Services    Frequency 7X/week   Plan Discharge plan remains appropriate;Frequency remains appropriate    Precautions / Restrictions Precautions Precautions: Fall;Knee Precaution Comments: L shoulder painful to move and palpate Required Braces or Orthoses: Knee Immobilizer - Right Knee Immobilizer - Right: Discontinue post op day 2;On when out of bed or walking Restrictions Weight Bearing Restrictions: No RLE Weight Bearing: Weight bearing as tolerated   Pertinent Vitals/Pain "too painful" to get OOB    Mobility  Bed Mobility Bed Mobility: Not assessed Transfers Transfers: Not assessed Details for Transfer Assistance: pt very frustrated with all activity this morning, wanting to get to the bedside commode but cursing at therapist as we are helping him, vc's for wt shifting and trunk extension.  Pt much calmer after transfer back into bed and settled  Ambulation/Gait Ambulation/Gait Assistance: Not tested (comment) Stairs: No Wheelchair Mobility Wheelchair Mobility: No    Exercises Total Joint Exercises Ankle Circles/Pumps: AROM;Both;15 reps;Supine Quad Sets: AROM;Both;10  reps;Limitations;Supine Quad Sets Limitations: 1/5 quad contraction and pt has difficulty understanding the exercise Gluteal Sets: AROM;Both;10 reps;Supine Short Arc Quad: Right;10 reps;Supine;AAROM Heel Slides: AAROM;10 reps;Supine Hip ABduction/ADduction: AAROM;Right;10 reps;Supine Straight Leg Raises: AAROM;Right;10 reps;Supine Other Exercises Other Exercises: Back in to assess patient after heat had been applied at least 10 minutes. Pt with increased AAROM at beginning of range up to 90 degrees supine in bed. No grimacing, whincing, increased effort as prior treatment today. Also had him raise arm (AAROM) to 90 degrees and work on horizontal abduction and adduction without c/o pain. Encouraged pt that after blood was finished to try and move that shoulder and that his caregiver could as for another hot pack, apply for 10 minutes making sure that there is some material between him and the hot pack then after this she could help him with the exercises again. Also told her she could do it tomorrow when she gets here. Caregiver reports that the patien has a microwavable heat pack at home, encouaged her to find it and use at the SNF for his shoulder.   PT Diagnosis:    PT Problem List:   PT Treatment Interventions:     PT Goals Acute Rehab PT Goals PT Goal Formulation: With patient Time For Goal Achievement: 03/07/12 Potential to Achieve Goals: Fair Pt will go Supine/Side to Sit: with supervision PT Goal: Supine/Side to Sit - Progress: Progressing toward goal Pt will go Sit to Supine/Side: with supervision PT Goal: Sit to Supine/Side - Progress: Progressing toward goal Pt will go Sit to Stand: with supervision PT Goal: Sit to Stand - Progress: Progressing toward goal Pt will go Stand to Sit: with supervision PT Goal:  Stand to Sit - Progress: Progressing toward goal Pt will Ambulate: 51 - 150 feet;with supervision;with least restrictive assistive device PT Goal: Ambulate - Progress: Not  progressing Pt will Perform Home Exercise Program: with supervision, verbal cues required/provided PT Goal: Perform Home Exercise Program - Progress: Goal set today  Visit Information  Last PT Received On: 03/01/12 Assistance Needed: +2    Subjective Data  Subjective: pt much calmer, resting in bed Patient Stated Goal: be able to walk   Cognition  Overall Cognitive Status: History of cognitive impairments - at baseline (CVA but answers questions appropriately) Arousal/Alertness: Awake/alert Orientation Level: Appears intact for tasks assessed Behavior During Session: [facility] for tasks performed Cognition - Other Comments: pt speaking better this afternoon when not agitated and had received Valium. Pt's caregiver present and reports that he takes Valium in morning and night at home and that she feels his cognitive function has declined slightly over past few months.    Balance  Balance Balance Assessed: No Static Sitting Balance Static Sitting - Balance Support: Bilateral upper extremity supported;Feet supported Static Sitting - Level of Assistance: 5: Stand by assistance (left lean)  End of Session PT - End of Session Equipment Utilized During Treatment: Gait belt;Right knee immobilizer Activity Tolerance: Patient tolerated treatment well Patient left: in bed;with call bell/phone within reach;with family/visitor present Nurse Communication: Mobility status   GP   Lyanne Co, PT  Acute Rehab Services  [phone]   Lyanne Co 03/01/2012, 4:30 PM

## 2012-03-01 NOTE — Progress Notes (Signed)
Clinical Social Work  CSW spoke with [facility]	[place] who is agreeable to accept patient on 03/02/12. CSW submitted all information to [facility] for authorization. CSW spoke with patient and caregiver at bedside and updated them on dc plans. CSW will continue to follow.  [place], [place] [phone]

## 2012-03-01 NOTE — Progress Notes (Signed)
ANTICOAGULATION CONSULT NOTE - Initial Consult  Pharmacy Consult for Coumadin Indication: s/p TKA, PAF  Allergies  Allergen Reactions  . Ace Inhibitors     Renal insufficiency  . Beta Adrenergic Blockers     bradycardia  . Statins    Labs:  Basename 03/01/12 0545 02/29/12 0420 02/28/12 0647  HGB 8.3* 9.0* --  HCT 25.5* 28.5* --  PLT 209 250 --  APTT -- -- 41*  LABPROT 21.4* 15.8* 14.1  INR 1.94* 1.29 1.10  HEPARINUNFRC -- -- --  CREATININE 2.01* 1.88* 2.16*  CKTOTAL -- -- --  CKMB -- -- --  TROPONINI -- -- --    Estimated Creatinine Clearance: 32.7 ml/min (by C-G formula based on Cr of 2.01).   Medical History: Past Medical History  Diagnosis Date  . Diabetes mellitus   . Degenerative disc disease, lumbar     back surgery 2011  . Gout   . Atrial fibrillation   . PVD (peripheral vascular disease)   . Aortic stenosis   . Hyperlipidemia   . Chronic kidney disease (CKD), stage III (moderate)   . H/O colonoscopy June 2007    normal  . Screening for osteoporosis Feb 2012    T -1.3 hip  . Stroke 2012    Rmbloic, with expressive aphasia  . Renal artery stenosis, native, bilateral     s/p right RA stent 2012, pending left   . Heart murmur   . Anxiety   . Sleep apnea     does not use CPAP  . Right knee DJD   . Hypertension     dr tim gollan   [facility] [place]  . Right knee DJD 02/28/2012   Assessment: 78 year old male who presented to [facility] today for a TKA.  He was taking Coumadin PTA for PAF, but had stopped 5 days prior to surgery. INR 1.29 today.   Home dose of Coumadin = 5 mg daily except 7.5 mg Wednesday and Friday  Goal of Therapy:  INR 2-3 Monitor platelets by anticoagulation protocol: Yes   Plan:  1) Coumadin 3 mg po x 1 dose tonight 2) Daily INR  Chaselyn Nanney L. Illene Bolus, PharmD, BCPS Clinical Pharmacist Pager: [phone] Pharmacy: [phone] 03/01/2012 1:01 PM

## 2012-03-01 NOTE — Progress Notes (Addendum)
Occupational Therapy Treatment Patient Details Name: Scott Norman MRN: [id number] DOB: 13-Nov-1930 Today's Date: 03/01/2012 Time: 2952-8413 OT Time Calculation (min): 31 min  OT Assessment / Plan / Recommendation Comments on Treatment Session Pt still with pain LUE(shoulder) with movement and palpation. Wonder if position in surgery may have caused this since he and caregiver report he has never had issues with pain in his left shoulder.    Follow Up Recommendations  Skilled nursing facility       Equipment Recommendations  None recommended by PT;None recommended by OT       Frequency Min 2X/week   Plan Discharge plan remains appropriate    Precautions / Restrictions Precautions Precautions: Knee;Fall Required Braces or Orthoses: Knee Immobilizer - Right Knee Immobilizer - Right: On when out of bed or walking;Discontinue post op day 2 Restrictions Weight Bearing Restrictions: No RLE Weight Bearing: Weight bearing as tolerated   Pertinent Vitals/Pain 3/10 left shoulder      OT Goals ADL Goals ADL Goal: Additional Goal #1 - Progress:  (heat prior to arm work seems to help)  Visit Information  Last OT Received On: 03/01/12 Assistance Needed: +1 (In bed)          Cognition  Overall Cognitive Status: History of cognitive impairments - at baseline Arousal/Alertness: Awake/alert Orientation Level: Other (comment) (difficult to assess with aphasia) Behavior During Session: [facility] for tasks performed Cognition - Other Comments: difficulty to fully assess cognition, pt not wanting therapy today and wanting to be left alone       Exercises  Other Exercises Other Exercises: Worked with pt on AAROM of LUE at shoulder. Pt reports his left shoulder hurts to palpation and movement just left of his biceps tendon as you face him. While laying in bed and asking him to lift his left arm with or without elbow bent he really struggles to do this to about the  45 degree angle then the  movement is more fluid and smooth (AAROM). Reports no pain with brinig the arm back down to his side. Massage to area of discomfort as well as some humeral head mobilizations (only increased pain if deeper tissue massage). Pt then performed 10 reps (AAROM of shoulder flexion to 90 degrees.  At end of session heat was applied.      End of Session OT - End of Session Equipment Utilized During Treatment:  (None, only worked on arm) Activity Tolerance: Patient tolerated treatment well Patient left: in bed (with his caregiver from home with him)       Evette Georges [phone] 03/01/2012, 2:52 PM

## 2012-03-02 ENCOUNTER — Encounter: Payer: Self-pay | Admitting: Internal Medicine

## 2012-03-02 LAB — CBC
MCH: 29.1 pg (ref 26.0–34.0)
MCV: 87 fL (ref 78.0–100.0)
Platelets: 219 10*3/uL (ref 150–400)
RDW: 16.4 % — ABNORMAL HIGH (ref 11.5–15.5)

## 2012-03-02 LAB — TYPE AND SCREEN
ABO/RH(D): A POS
Antibody Screen: NEGATIVE
Unit division: 0

## 2012-03-02 LAB — GLUCOSE, CAPILLARY: Glucose-Capillary: 92 mg/dL (ref 70–99)

## 2012-03-02 LAB — BASIC METABOLIC PANEL
Calcium: 8.9 mg/dL (ref 8.4–10.5)
Creatinine, Ser: 1.78 mg/dL — ABNORMAL HIGH (ref 0.50–1.35)
GFR calc Af Amer: 40 mL/min — ABNORMAL LOW (ref >90)
GFR calc non Af Amer: 35 mL/min — ABNORMAL LOW (ref >90)

## 2012-03-02 LAB — PROTIME-INR: Prothrombin Time: 21.4 seconds — ABNORMAL HIGH (ref 11.6–15.2)

## 2012-03-02 MED ORDER — WARFARIN SODIUM 5 MG PO TABS
5.0000 mg | ORAL_TABLET | ORAL | Status: DC
  Filled 2012-03-02: qty 1

## 2012-03-02 MED ORDER — INSULIN ASPART 100 UNIT/ML ~~LOC~~ SOLN: SUBCUTANEOUS | Status: DC

## 2012-03-02 MED ORDER — WARFARIN SODIUM 7.5 MG PO TABS: 7.5000 mg | ORAL_TABLET | ORAL | Status: DC

## 2012-03-02 MED ORDER — FUROSEMIDE 40 MG PO TABS: 20.0000 mg | ORAL_TABLET | ORAL | Status: DC

## 2012-03-02 MED ORDER — ISOSORBIDE MONONITRATE ER 60 MG PO TB24: 60.0000 mg | ORAL_TABLET | Freq: Every day | ORAL | Status: DC

## 2012-03-02 MED ORDER — NYSTATIN 100000 UNIT/GM EX POWD: Freq: Three times a day (TID) | CUTANEOUS | Status: DC

## 2012-03-02 MED ORDER — DEXAMETHASONE 2 MG PO TABS: 4.0000 mg | ORAL_TABLET | Freq: Every day | ORAL | Status: DC

## 2012-03-02 MED ORDER — OXYCODONE HCL 5 MG PO TABS: 5.0000 mg | ORAL_TABLET | ORAL | Status: DC | PRN

## 2012-03-02 MED ORDER — DOCUSATE SODIUM 100 MG PO CAPS: ORAL_CAPSULE | ORAL | Status: DC

## 2012-03-02 NOTE — Progress Notes (Signed)
Patient discharged to [facility] in [place] ([facility]) via EMS. Patient's spouse in room at discharge and will meet patient at facility. Paperwork sent to facility via EMS.

## 2012-03-02 NOTE — Progress Notes (Addendum)
ANTICOAGULATION CONSULT NOTE - Follow-up Consult  Pharmacy Consult for Coumadin Indication: s/p TKA, PAF  Allergies  Allergen Reactions  . Ace Inhibitors     Renal insufficiency  . Beta Adrenergic Blockers     bradycardia  . Statins    Labs:  [facility] 03/02/12 0450 03/01/12 0545 02/29/12 0420  HGB 9.4* 8.3* --  HCT 28.1* 25.5* 28.5*  PLT 219 209 250  APTT -- -- --  LABPROT 21.4* 21.4* 15.8*  INR 1.94* 1.94* 1.29  HEPARINUNFRC -- -- --  CREATININE 1.78* 2.01* 1.88*  CKTOTAL -- -- --  CKMB -- -- --  TROPONINI -- -- --    Estimated Creatinine Clearance: 36.9 ml/min (by C-G formula based on Cr of 1.78).   Medical History: Past Medical History  Diagnosis Date  . Diabetes mellitus   . Degenerative disc disease, lumbar     back surgery 2011  . Gout   . Atrial fibrillation   . PVD (peripheral vascular disease)   . Aortic stenosis   . Hyperlipidemia   . Chronic kidney disease (CKD), stage III (moderate)   . H/O colonoscopy June 2007    normal  . Screening for osteoporosis Feb 2012    T -1.3 hip  . Stroke 2012    Rmbloic, with expressive aphasia  . Renal artery stenosis, native, bilateral     s/p right RA stent 2012, pending left   . Heart murmur   . Anxiety   . Sleep apnea     does not use CPAP  . Right knee DJD   . Hypertension     dr tim gollan   [facility] [place]  . Right knee DJD 02/28/2012   Assessment: 78 year old male who presented to [facility] today for a TKA.  He was taking Coumadin PTA for PAF, but had stopped 5 days prior to surgery. INR almost at goal today.  No complications noted.  Home dose of Coumadin = 5 mg daily except 7.5 mg Wednesday and Friday  Goal of Therapy:  INR 2-3 Monitor platelets by anticoagulation protocol: Yes   Plan:  1) Resume home Coumadin 5 mg po daily except 7.5 mg wed/fri 2) Daily INR 3) Lovenox 30 mg sq q12h continues - hopefully can d/c in am if INR >2  Jill Side L. Illene Bolus, PharmD, BCPS Clinical Pharmacist Pager:  [phone] Pharmacy: [phone] 03/02/2012 12:48 PM

## 2012-03-02 NOTE — Progress Notes (Signed)
Occupational Therapy Treatment Patient Details Name: Scott Norman MRN: [id number] DOB: [birth year] Today's Date: 03/02/2012 Time: 7829-5621 OT Time Calculation (min): 39 min  OT Assessment / Plan / Recommendation Comments on Treatment Session Pt. is progressing with functional mobility.  Pain in Lt. shoulder improving per subjective report and pt. with improved ROM    Follow Up Recommendations  Skilled nursing facility    Barriers to Discharge       Equipment Recommendations  None recommended by PT;None recommended by OT    Recommendations for Other Services    Frequency Min 2X/week   Plan Discharge plan remains appropriate    Precautions / Restrictions Precautions Precautions: Fall;Knee Required Braces or Orthoses: Knee Immobilizer - Right Knee Immobilizer - Right: Discontinue post op day 2 Restrictions Weight Bearing Restrictions: No RLE Weight Bearing: Weight bearing as tolerated   Pertinent Vitals/Pain     ADL  Toilet Transfer: Simulated;Moderate assistance Toilet Transfer Method: Sit to stand Toilet Transfer Equipment: Raised toilet seat with arms (or 3-in-1 over toilet) Toileting - Clothing Manipulation and Hygiene: Simulated;Moderate assistance Where Assessed - Toileting Clothing Manipulation and Hygiene: Standing Equipment Used: Rolling walker Transfers/Ambulation Related to ADLs: Pt. ambulated with min A ADL Comments: Pt. very motivated and participatory.  Pt reports continued pain in Lt. shoulder.  Soft tissue mobilization performed. Pt. performed 10 reps mid range shoulder flexion/ext active assited.  pt. with complaint of pain with eccentric shoulder flexion.  Performed 10 reps scap depression with elbow extension     OT Diagnosis:    OT Problem List:   OT Treatment Interventions:     OT Goals ADL Goals ADL Goal: Toilet Transfer - Progress: Progressing toward goals ADL Goal: Additional Goal #1 - Progress: Progressing toward goals  Visit Information  Last OT Received On: 03/02/12 Assistance Needed: +1 PT/OT Co-Evaluation/Treatment: Yes    Subjective Data      Prior Functioning       Cognition  Overall Cognitive Status: Appears within functional limits for tasks assessed/performed Arousal/Alertness: Awake/alert Orientation Level: Appears intact for tasks assessed Behavior During Session: [facility] for tasks performed Cognition - Other Comments: pt speaking better this afternoon when not agitated and had received Valium. Pt's caregiver present and reports that he takes Valium in morning and night at home and that she feels his cognitive function has declined slightly over past few months.    Mobility  Shoulder Instructions Bed Mobility Bed Mobility: Supine to Sit;Sitting - Scoot to Edge of Bed Supine to Sit: 3: Mod assist;With rails;HOB flat Sitting - Scoot to Edge of Bed: 4: Min assist Sit to Supine: 4: Min assist Details for Bed Mobility Assistance: A for LEs in and out of bed. Cues for positioning and technique Transfers Transfers: Sit to Stand;Stand to Sit Sit to Stand: 1: +2 Total assist;With upper extremity assist;From bed Sit to Stand: Patient Percentage: 70% Stand to Sit: 4: Min assist;With upper extremity assist;To bed Details for Transfer Assistance: A to initiate stand and to ensure balance. Cues for positioning and safe hand placement       Exercises      Balance     End of Session OT - End of Session Equipment Utilized During Treatment: Gait belt Activity Tolerance: Patient tolerated treatment well Patient left: in bed;with family/visitor present  GO     Byard Carranza, Ursula Alert M 03/02/2012, 11:21 PM

## 2012-03-02 NOTE — Progress Notes (Signed)
Orthopedic Tech Progress Note Patient Details:  Scott Norman 1930-07-27 [id number] Upon verification of Ortho Tech orders it was discovered that patient has already been issued Knee Immobilizer. No action needed. Patient ID: Scott Norman, male   DOB: [birth year], 78 y.o.   MRN: [id number]   [name] 03/02/2012, 12:40 PM

## 2012-03-02 NOTE — Progress Notes (Signed)
Clinical Social Work  CSW faxed [employer] summary and hard scipts to SNF [facility]). SNF received information and agreeable to admission today. CSW informed patient, caregiver and RN of dc and all were agreeable. CSW prepared dc packet and coordinated transportation via [facility]. CSW is signing off.  [place], [place] [phone]

## 2012-03-02 NOTE — Progress Notes (Signed)
Physical Therapy Treatment Patient Details Name: Scott Norman MRN: [id number] DOB: 1930-11-30 Today's Date: 03/02/2012 Time: 9562-1308 PT Time Calculation (min): 21 min  PT Assessment / Plan / Recommendation Comments on Treatment Session  Patient progressing with ambulation today. Patient has been agitated with people not knowing how to approach him and giving him the time to complete the task as his pain allows him to do. Planning on discharging to SNF today    Follow Up Recommendations  Post acute inpatient rehab    Barriers to Discharge        Equipment Recommendations  None recommended by PT;None recommended by OT    Recommendations for Other Services    Frequency 7X/week   Plan Discharge plan remains appropriate;Frequency remains appropriate    Precautions / Restrictions Precautions Precautions: Fall;Knee Knee Immobilizer - Right: Discontinue post op day 2 Restrictions Weight Bearing Restrictions: No RLE Weight Bearing: Weight bearing as tolerated   Pertinent Vitals/Pain     Mobility  Bed Mobility Supine to Sit: 3: Mod assist;With rails;HOB flat Sitting - Scoot to Edge of Bed: 4: Min assist Sit to Supine: 4: Min assist Details for Bed Mobility Assistance: A for LEs in and out of bed. Cues for positioning and technique Transfers Sit to Stand: 1: +2 Total assist;With upper extremity assist;From bed Sit to Stand: Patient Percentage: 70% Stand to Sit: 4: Min assist;With upper extremity assist;To bed Details for Transfer Assistance: A to initiate stand and to ensure balance. Cues for positioning and safe hand placement Ambulation/Gait Ambulation/Gait Assistance: 4: Min assist Ambulation Distance (Feet): 20 Feet Assistive device: Rolling walker Ambulation/Gait Assistance Details: Cues for upright posture and gait sequence.  Gait Pattern: Step-through pattern;Decreased stride length;Trunk flexed;Antalgic    Exercises Total Joint Exercises Ankle Circles/Pumps:  AROM;Both;10 reps Quad Sets: AROM;Right;10 reps Straight Leg Raises: AAROM;Right;5 reps   PT Diagnosis:    PT Problem List:   PT Treatment Interventions:     PT Goals Acute Rehab PT Goals PT Goal: Supine/Side to Sit - Progress: Progressing toward goal PT Goal: Sit to Supine/Side - Progress: Progressing toward goal PT Goal: Sit to Stand - Progress: Progressing toward goal PT Goal: Stand to Sit - Progress: Progressing toward goal PT Goal: Ambulate - Progress: Progressing toward goal PT Goal: Perform Home Exercise Program - Progress: Progressing toward goal  Visit Information  Last PT Received On: 03/02/12 Assistance Needed: +1    Subjective Data      Cognition  Overall Cognitive Status: Appears within functional limits for tasks assessed/performed Arousal/Alertness: Awake/alert Orientation Level: Appears intact for tasks assessed Behavior During Session: [facility] for tasks performed    Balance     End of Session PT - End of Session Equipment Utilized During Treatment: Gait belt Activity Tolerance: Patient tolerated treatment well Patient left: in bed;with call bell/phone within reach Nurse Communication: Mobility status   GP     Fredrich Birks 03/02/2012, 12:56 PM 03/02/2012 Fredrich Birks PTA [phone] pager [phone] office

## 2012-03-02 NOTE — Discharge Summary (Signed)
Patient ID: Scott Norman MRN: [id number] DOB/AGE: 76-Jul-1932 76 y.o.  Admit date: 02/28/2012 Discharge date: 03/02/2012  Admission Diagnoses:  Principal Problem:  *Right knee DJD Active Problems:  Atrial fibrillation  Aortic stenosis  Hyperlipidemia  Hypertension  Bradycardia  CKD (chronic kidney disease)  CVA (cerebrovascular accident)  Atrial flutter  Type II diabetes mellitus with nephropathy  Right renal artery stenosis  Valvular cardiomyopathy  Knee pain, right  Acute on chronic diastolic CHF (congestive heart failure)  Iron deficiency anemia  Dysphagia as late effect of stroke  Degenerative disc disease, lumbar  PVD (peripheral vascular disease)  Chronic kidney disease (CKD), stage III (moderate)  Stroke  Anxiety  Sleep apnea  Postoperative anemia due to acute blood loss   Discharge Diagnoses:  Same  Past Medical History  Diagnosis Date  . Diabetes mellitus   . Degenerative disc disease, lumbar     back surgery 2011  . Gout   . Atrial fibrillation   . PVD (peripheral vascular disease)   . Aortic stenosis   . Hyperlipidemia   . Chronic kidney disease (CKD), stage III (moderate)   . H/O colonoscopy June 2007    normal  . Screening for osteoporosis Feb 2012    T -1.3 hip  . Stroke 2012    Rmbloic, with expressive aphasia  . Renal artery stenosis, native, bilateral     s/p right RA stent 2012, pending left   . Heart murmur   . Anxiety   . Sleep apnea     does not use CPAP  . Right knee DJD   . Hypertension     dr tim gollan   [facility]   . Right knee DJD 02/28/2012    Surgeries: Procedure(s): TOTAL KNEE ARTHROPLASTY on 02/28/2012   Consultants: Treatment Team:  [facility], MD  Discharged Condition: Improved  Hospital Course: Scott Norman is an 78 y.o. male who was admitted 02/28/2012 for operative treatment ofRight knee DJD. Patient has severe unremitting pain that affects sleep, daily activities, and work/hobbies. After  pre-op clearance the patient was taken to the operating room on 02/28/2012 and underwent  Procedure(s): TOTAL KNEE ARTHROPLASTY.    Patient was given perioperative antibiotics: Anti-infectives     Start     Dose/Rate Route Frequency Ordered Stop   02/28/12 1615   ceFAZolin (ANCEF) IVPB 2 g/50 mL premix        2 g 100 mL/hr over 30 Minutes Intravenous Every 6 hours 02/28/12 1614 02/28/12 2314   02/28/12 0912   cefUROXime (ZINACEF) injection  Status:  Discontinued          As needed 02/28/12 0912 02/28/12 1125   02/27/12 1549   ceFAZolin (ANCEF) IVPB 2 g/50 mL premix  Status:  Discontinued        2 g 100 mL/hr over 30 Minutes Intravenous 60 min pre-op 02/27/12 1549 02/28/12 1426   02/27/12 1549   ceFAZolin (ANCEF) IVPB 2 g/50 mL premix  Status:  Discontinued        2 g 100 mL/hr over 30 Minutes Intravenous 60 min pre-op 02/27/12 1549 02/28/12 1426          The night of surgery the patient was placed in ICU due to his uncontrolled hypertension and cardiac arrythmia with frequent PVC.  Post op day one the patient's blood pressure was stable and his PVC were still frequent but asymptomatic and did not change with activity with physical therapy.  Post op day 1 in the late afternoon  the patient was transferred to a regular telemetry bed.  Post op day 2, the patient was doing very well and stable,  He was transfused 2 units of packed red blood cells due to his postoperative acute blood loss anemia in the setting of significant coronary arrhythmia.  He tolerated the transfusion well.  He declined physical therapy twice on post op day 2.  He got up to the bedside commode in the morning but stooled in the bed in the afternoon.  Post op day three, I got him up ambulating with his knee immobilizer on.  He ambulated from the bed to the door and back to the chair.  He is not motivated to ambulate and will require maximum encouragement to do so.  The patient needs to lie mostly flat at night with the head of  the bed only slightly elevated in order for his yeast infections in his groin to get some air.  He tolerated the yellow block from post op day one on.  This needs to be under his heel at all times except when he is walking or when he is in his CPM.  He tolerated the CPM 0-60 degrees.  He needs to do this 6 hrs a day.  2 hours per shift.  He needs to increase by 10 degrees a day until he has reached 90.  Patient was given sequential compression devices, early ambulation, and chemoprophylaxis to prevent DVT.  Patient benefited maximally from hospital stay and there were no other  complications.    Recent vital signs: Patient Vitals for the past 24 hrs:  BP Temp Temp src Pulse Resp SpO2  03/02/12 0700 159/60 mmHg - - 56  - -  03/02/12 0653 183/47 mmHg 98.2 F (36.8 C) Oral 52  18  95 %  03/02/12 0400 - - - - 18  -  03/02/12 0000 - - - - 18  99 %  03/25/2012 2215 160/58 mmHg - - - - -  Mar 25, 2012 2205 199/59 mmHg 98.8 F (37.1 C) Oral 70  18  99 %  March 25, 2012 2155 199/59 mmHg - - - - -  Mar 25, 2012 2000 - - - - 18  99 %  2012-03-25 1640 175/46 mmHg 98.8 F (37.1 C) Oral 79  18  -  Mar 25, 2012 1555 168/58 mmHg 98.7 F (37.1 C) Oral 76  16  -  03-25-12 1455 176/60 mmHg 98.6 F (37 C) Oral 77  16  -  03-25-2012 1355 179/49 mmHg 98.8 F (37.1 C) Oral 72  18  -  03/25/2012 1340 165/57 mmHg 98.5 F (36.9 C) Oral 72  18  -  March 25, 2012 1256 172/58 mmHg 98.4 F (36.9 C) Oral 72  18  -  03/25/2012 1230 176/59 mmHg 98.2 F (36.8 C) Oral 72  18  -  March 25, 2012 1130 154/58 mmHg 98.8 F (37.1 C) Oral 70  18  -  Mar 25, 2012 1030 153/47 mmHg 98.9 F (37.2 C) Oral 69  18  -  2012-03-25 1015 162/46 mmHg 98.8 F (37.1 C) Oral 68  16  -     Recent laboratory studies:  Basename 03/02/12 0450 2012-03-25 0545  WBC 9.5 9.2  HGB 9.4* 8.3*  HCT 28.1* 25.5*  PLT 219 209  NA 141 142  K 3.8 4.4  CL 110 110  CO2 23 24  BUN 35* 33*  CREATININE 1.78* 2.01*  GLUCOSE 114* 102*  INR 1.94* 1.94*  CALCIUM 8.9 --     Discharge  Medications:  Medication List     As of 03/02/2012  9:03 AM    STOP taking these medications         Cytra-2 500-334 MG/5ML Soln      enoxaparin 100 MG/ML injection   Commonly known as: LOVENOX      glipiZIDE 5 MG tablet   Commonly known as: GLUCOTROL      labetalol 100 MG tablet   Commonly known as: NORMODYNE      oxyCODONE-acetaminophen 5-325 MG per tablet   Commonly known as: PERCOCET/ROXICET      potassium chloride 20 MEQ/15ML (10%) solution      SPS 15 GM/60ML suspension   Generic drug: sodium polystyrene      TAKE these medications         amLODipine 5 MG tablet   Commonly known as: NORVASC   Take 5 mg by mouth daily.      chlorhexidine 0.12 % solution   Commonly known as: PERIDEX   Use as directed 5 mLs in the mouth or throat Daily.      dexamethasone 2 MG tablet   Commonly known as: DECADRON   Take 2 tablets (4 mg total) by mouth daily.      diazepam 5 MG tablet   Commonly known as: VALIUM   Take 2.5 mg by mouth 2 (two) times daily before lunch and supper.      docusate sodium 100 MG capsule   Commonly known as: COLACE   1 tab 2 times a day while on narcotics. For stool softner      doxazosin 4 MG tablet   Commonly known as: CARDURA   Take 1 tablet (4 mg total) by mouth at bedtime.      fenofibrate 145 MG tablet   Commonly known as: TRICOR   Take 1 tablet (145 mg total) by mouth daily.      furosemide 40 MG tablet   Commonly known as: LASIX   Take 0.5 tablets (20 mg total) by mouth 3 (three) times a week.      glucose blood test strip   Use as instructed      hydrALAZINE 100 MG tablet   Commonly known as: APRESOLINE   Take 1.5 tablets (150 mg total) by mouth 4 (four) times daily.      insulin aspart 100 UNIT/ML injection   Commonly known as: novoLOG   Correction coverage: Moderate (average weight, post-op)  CBG < 70: implement hypoglycemia protocol  CBG 70 - 120: 0 units  CBG 121 - 150: 2 units  CBG 151 - 200: 3 units  CBG 201  - 250: 5 units  CBG 251 - 300: 8 units  CBG 301 - 350: 11 units  CBG 351 - 400: 15 units  CBG > 400: call MD and obtain STAT lab verification      insulin aspart 100 UNIT/ML injection   Commonly known as: novoLOG   0-5 Units, Subcutaneous, Daily at bedtime, First dose on Mon 02/28/12 at 2200  Correction coverage: HS scale  CBG < 70: implement hypoglycemia protocol  CBG 70 - 120: 0 units  CBG 121 - 150: 0 units  CBG 151 - 200: 0 units  CBG 201 - 250: 2 units  CBG 251 - 300: 3 units  CBG 301 - 350: 4 units  CBG 351 - 400: 5 units  CBG > 400: call MD and obtain STAT lab verification      isosorbide mononitrate 60 MG 24 hr tablet  Commonly known as: IMDUR   Take 60 mg by mouth See admin instructions. Take 1 tablet at bed time.  If hypertension in am then take one as needed in am      mirtazapine 7.5 mg Tabs   Commonly known as: REMERON   Take 7.5 mg by mouth at bedtime.      nitroGLYCERIN 0.4 MG SL tablet   Commonly known as: NITROSTAT   Place 0.4 mg under the tongue every 5 (five) minutes as needed. For chest pain      nystatin powder   Commonly known as: MYCOSTATIN   Apply topically 3 (three) times daily. Clean bilateral groin area well with soap and water.  Dry.  Apply powder to rash      oxyCODONE 5 MG immediate release tablet   Commonly known as: Oxy IR/ROXICODONE   Take 1-2 tablets (5-10 mg total) by mouth every 3 (three) hours as needed.      warfarin 5 MG tablet   Commonly known as: COUMADIN   Take 5 mg by mouth daily. 5mg  every day except for Wednesday and Friday take 7.5mg         Diagnostic Studies: Dg Chest 2 View  02/10/2012  *RADIOLOGY REPORT*  Clinical Data: Follow up on pneumonia.  CHEST - 2 VIEW  Comparison: 12/24/2011  Findings: The heart is enlarged.  There is patchy infiltrate in the left midlung zone and perihilar region, with increased lateral component since previous exam.  Right lung remains clear.  No edema.  IMPRESSION: Persistent  left midlung zone infiltrate with more lateral component compared to previous studies. Given the failure of this process to resolve over more than 2 months, findings are concerning for possible malignancy.  If the patient's symptoms are typical for infection, consider one additional follow-up.  However, if the patient is asymptomatic, a repeat CT would be recommended.   Original Report Authenticated By: Patterson Hammersmith, M.D.     Disposition: 03-Skilled Nursing Facility      Discharge Orders    Future Appointments: Provider: Department: Dept Phone: Center:   03/17/2012 11:00 AM Nyoka Cowden, MD [facility] [phone] None   05/04/2012 11:15 AM Antonieta Iba, MD [facility] [phone] [facility]     Future Orders Please Complete By Expires   Diet - low sodium heart healthy      Call MD / Call 911      Comments:   If you experience chest pain or shortness of breath, CALL 911 and be transported to the hospital emergency room.  If you develope a fever above 101 F, pus (white drainage) or increased drainage or redness at the wound, or calf pain, call your surgeon's office.   Constipation Prevention      Comments:   Drink plenty of fluids.  Prune juice may be helpful.  You may use a stool softener, such as Colace (over the counter) 100 mg twice a day.  Use MiraLax (over the counter) for constipation as needed.   Increase activity slowly as tolerated      Discharge instructions      Comments:   Total Knee Replacement Care After Refer to this sheet in the next few weeks. These discharge instructions provide you with general information on caring for yourself after you leave the hospital. Your caregiver may also give you specific instructions. Your treatment has been planned according to the most current medical practices available, but unavoidable complications sometimes occur. If you have any problems or questions after  discharge, please call your caregiver. Regaining  a near full range of motion of your knee within the first 3 to 6 weeks after surgery is critical. HOME CARE INSTRUCTIONS  You may resume a normal diet and activities as directed.  Perform exercises as directed.  Place yellow foam block, yellow side up under heel at all times except when in CPM or when walking.  DO NOT modify, tear, cut, or change in any way. You will receive physical therapy daily  Take showers instead of baths until informed otherwise.  Change bandages (dressings)daily Do not take over-the-counter or prescription medicines for pain, discomfort, or fever. Eat a well-balanced diet.  Avoid lifting or driving until you are instructed otherwise.  Make an appointment to see your caregiver for stitches (suture) or staple removal as directed.  If you have been sent home with a continuous passive motion machine (CPM machine), 0-90 degrees 6 hrs a day   2 hrs a shift SEEK MEDICAL CARE IF: You have swelling of your calf or leg.  You develop shortness of breath or chest pain.  You have redness, swelling, or increasing pain in the wound.  There is pus or any unusual drainage coming from the surgical site.  You notice a bad smell coming from the surgical site or dressing.  The surgical site breaks open after sutures or staples have been removed.  There is persistent bleeding from the suture or staple line.  You are getting worse or are not improving.  You have any other questions or concerns.  SEEK IMMEDIATE MEDICAL CARE IF:  You have a fever.  You develop a rash.  You have difficulty breathing.  You develop any reaction or side effects to medicines given.  Your knee motion is decreasing rather than improving.  MAKE SURE YOU:  Understand these instructions.  Will watch your condition.  Will get help right away if you are not doing well or get worse.   CPM      Comments:   Continuous passive motion machine (CPM):      Use the CPM from 0 to 90 for 6 hours per day.       You may  break it up into 2 or 3 sessions per day.      Use CPM for 2 weeks or until you are told to stop.   TED hose      Comments:   Use stockings (TED hose) for 2 weeks on both leg(s).  You may remove them at night for sleeping.   Change dressing      Comments:   Change the dressing daily with sterile 4 x 4 inch gauze dressing and apply TED hose.  You may clean the incision with alcohol prior to redressing.   Do not put a pillow under the knee. Place it under the heel.      Comments:   Place yellow foam block, yellow side up under heel at all times except when in CPM or when walking.  DO NOT modify, tear, cut, or change in any way the yellow foam block.      Follow-up Information    Follow up with Nilda Simmer, MD. On 03/13/2012. (appt time 2 pm)    Contact information:   [facility] [address] [place] [address] [phone]           Signed: Pascal Lux 03/02/2012, 9:03 AM

## 2012-03-14 LAB — PROTIME-INR: Prothrombin Time: 19 secs — ABNORMAL HIGH (ref 11.5–14.7)

## 2012-03-15 ENCOUNTER — Telehealth: Payer: Self-pay | Admitting: Cardiovascular Disease

## 2012-03-15 ENCOUNTER — Ambulatory Visit (INDEPENDENT_AMBULATORY_CARE_PROVIDER_SITE_OTHER): Payer: Medicare Other

## 2012-03-15 DIAGNOSIS — I6350 Cerebral infarction due to unspecified occlusion or stenosis of unspecified cerebral artery: Secondary | ICD-10-CM

## 2012-03-15 DIAGNOSIS — Z7901 Long term (current) use of anticoagulants: Secondary | ICD-10-CM

## 2012-03-15 DIAGNOSIS — I639 Cerebral infarction, unspecified: Secondary | ICD-10-CM

## 2012-03-15 DIAGNOSIS — I4891 Unspecified atrial fibrillation: Secondary | ICD-10-CM

## 2012-03-15 NOTE — Telephone Encounter (Signed)
Returned call to [name] pt's caregiver.  Made f/u appt for Coumadin check today at 2:40pm.

## 2012-03-15 NOTE — Telephone Encounter (Signed)
Pt had right knee replacement.  Pt states he was told after he got out of hospital she wanted to see him back to do a coumadin check.  It's been 2 weeks and wants to have his coumadin checked.  Please call to advise.

## 2012-03-16 ENCOUNTER — Telehealth: Payer: Self-pay

## 2012-03-16 LAB — PROTIME-INR
INR: 1.7
Prothrombin Time: 20.4 secs — ABNORMAL HIGH (ref 11.5–14.7)

## 2012-03-16 NOTE — Telephone Encounter (Signed)
She states pt is currently in rehab at [facility], and while there they are monitoring and dosing pt's Coumadin.  Unsure of pt's current Coumadin dosage at present.  Advised pt's caregiver to make sure they are monitoring INR closely while pt is on Avelox.  Pt will most likely be in rehab facility x 3-4 more weeks. Will resume f/u in [facility] once he returns home.

## 2012-03-17 ENCOUNTER — Ambulatory Visit: Payer: Medicare Other | Admitting: Internal Medicine

## 2012-03-28 LAB — BASIC METABOLIC PANEL
Anion Gap: 10 (ref 7–16)
BUN: 30 mg/dL — ABNORMAL HIGH (ref 7–18)
Calcium, Total: 8.3 mg/dL — ABNORMAL LOW (ref 8.5–10.1)
EGFR (African American): 41 — ABNORMAL LOW
EGFR (Non-African Amer.): 36 — ABNORMAL LOW
Glucose: 94 mg/dL (ref 65–99)
Potassium: 3.8 mmol/L (ref 3.5–5.1)
Sodium: 143 mmol/L (ref 136–145)

## 2012-03-28 LAB — PROTIME-INR: Prothrombin Time: 23.1 secs — ABNORMAL HIGH (ref 11.5–14.7)

## 2012-03-31 ENCOUNTER — Encounter: Payer: Self-pay | Admitting: Internal Medicine

## 2012-03-31 ENCOUNTER — Ambulatory Visit: Payer: Medicare Other | Admitting: Internal Medicine

## 2012-04-05 ENCOUNTER — Ambulatory Visit (INDEPENDENT_AMBULATORY_CARE_PROVIDER_SITE_OTHER): Payer: Medicare Other | Admitting: Internal Medicine

## 2012-04-05 ENCOUNTER — Encounter: Payer: Self-pay | Admitting: Internal Medicine

## 2012-04-05 ENCOUNTER — Ambulatory Visit (INDEPENDENT_AMBULATORY_CARE_PROVIDER_SITE_OTHER): Payer: Medicare Other

## 2012-04-05 ENCOUNTER — Ambulatory Visit (INDEPENDENT_AMBULATORY_CARE_PROVIDER_SITE_OTHER)
Admission: RE | Admit: 2012-04-05 | Discharge: 2012-04-05 | Disposition: A | Discharge status: Home / Self Care | Payer: Medicare Other | Source: Ambulatory Visit | Attending: Internal Medicine | Admitting: Internal Medicine

## 2012-04-05 VITALS — BP 132/70 | HR 65 | Ht 72.0 in | Wt 220.0 lb

## 2012-04-05 DIAGNOSIS — J189 Pneumonia, unspecified organism: Secondary | ICD-10-CM

## 2012-04-05 DIAGNOSIS — I6350 Cerebral infarction due to unspecified occlusion or stenosis of unspecified cerebral artery: Secondary | ICD-10-CM

## 2012-04-05 DIAGNOSIS — I639 Cerebral infarction, unspecified: Secondary | ICD-10-CM

## 2012-04-05 DIAGNOSIS — I10 Essential (primary) hypertension: Secondary | ICD-10-CM

## 2012-04-05 DIAGNOSIS — Z7901 Long term (current) use of anticoagulants: Secondary | ICD-10-CM

## 2012-04-05 DIAGNOSIS — I4891 Unspecified atrial fibrillation: Secondary | ICD-10-CM

## 2012-04-05 NOTE — Patient Instructions (Addendum)
Please remember to go to the  x-ray department downstairs for your tests - we will call you with the results when they are available.  No further follow up here needed - we are in Dr Melina Schools office also if you need additional pulmonary follow up for new problems (cough or short of breath)

## 2012-04-05 NOTE — Progress Notes (Signed)
  Subjective:    Patient ID: Scott Norman, male    DOB: [birth year]   MRN: [id number]  HPI  25 yowm never smoker with no respiratory symptoms s/p cva 09/2010 [facility] with rehab in [place] with course complicated by dysphagia but no pna and changed diet to nl Sep 2012.  12/08/2011 1st pulmonary eval cc new onset choking x sev weeks while on vasotec and preop cxr for knee surgery c/w LLL sup segment pna.  He has only sltly discolored sputum and denies cp, fever, chills or sob but he is extremely limited by his knee at this point. rec Stop vasotec and start benicar 20 mg one daily instead Augmentin 875 twice daily with big glass water, then yogurt for lunch and let your coumadin clinic know you're on it    04/05/2012 f/u ov/Wert all smiles, cc no cough no sob, swallowing better, no need for pain meds   Sleeping ok without nocturnal  or early am exacerbation  of respiratory  c/o's or need for noct saba. Also denies any obvious fluctuation of symptoms with weather or environmental changes or other aggravating or alleviating factors except as outlined above   ROS  The following are not active complaints unless bolded sore throat, dysphagia, dental problems, itching, sneezing,  nasal congestion or excess/ purulent secretions, ear ache,   fever, chills, sweats, unintended wt loss, pleuritic or exertional cp, hemoptysis,  orthopnea pnd or leg swelling, presyncope, palpitations, heartburn, abdominal pain, anorexia, nausea, vomiting, diarrhea  or change in bowel or urinary habits, change in stools or urine, dysuria,hematuria,  rash, arthralgias, visual complaints, headache, numbness weakness or ataxia or problems with walking or coordination,  change in mood/affect or memory.               Objective:   Physical Exam Wt 215 12/09/2011 > 220 04/05/2012   4 prong walker elderly wm with expressive aphasia and hoarseness  HEENT: nl  turbinates, and orophanx. Nl external ear canals without cough  reflex   NECK :  without JVD/Nodes/TM/ nl carotid upstrokes bilaterally   LUNGS: no acc muscle use, clear to A and P bilaterally without cough on insp or exp maneuvers   CV:  RRR  no s3 or murmur or increase in P2, no edema   ABD:  soft and nontender with nl excursion in the supine position. No bruits or organomegaly, bowel sounds nl  MS:  warm without deformities, calf tenderness, cyanosis or clubbing  SKIN: warm and dry without lesions    NEURO:  alert, approp, no deficits    CXR  04/05/2012 :  Enlargement of cardiac silhouette with pulmonary vascular congestion. Peribronchial thickening increased perihilar markings question edema versus infection.       Assessment & Plan:

## 2012-04-06 NOTE — Progress Notes (Signed)
Quick Note:  Spoke with pt and notified of results per Dr. Wert. Pt verbalized understanding and denied any questions.  ______ 

## 2012-04-09 NOTE — Assessment & Plan Note (Signed)
Trial off acei 12/08/11 due to "choking" > resolved on f/u 04/05/12  Not clear this had anything to do with his apparent asp pna but he's doing so much better off ACEI I would not rechallenge him at this point

## 2012-04-09 NOTE — Assessment & Plan Note (Signed)
Resolved p augmentin c/w asp pna and doing much better so no pulmonary f/u needed

## 2012-04-10 ENCOUNTER — Ambulatory Visit (INDEPENDENT_AMBULATORY_CARE_PROVIDER_SITE_OTHER): Payer: Medicare Other

## 2012-04-10 ENCOUNTER — Ambulatory Visit (INDEPENDENT_AMBULATORY_CARE_PROVIDER_SITE_OTHER): Payer: Medicare Other | Admitting: Cardiovascular Disease

## 2012-04-10 ENCOUNTER — Encounter: Payer: Self-pay | Admitting: Cardiovascular Disease

## 2012-04-10 VITALS — BP 102/50 | HR 62 | Ht 72.0 in | Wt 211.0 lb

## 2012-04-10 DIAGNOSIS — I10 Essential (primary) hypertension: Secondary | ICD-10-CM

## 2012-04-10 DIAGNOSIS — I359 Nonrheumatic aortic valve disorder, unspecified: Secondary | ICD-10-CM

## 2012-04-10 DIAGNOSIS — I4892 Unspecified atrial flutter: Secondary | ICD-10-CM

## 2012-04-10 DIAGNOSIS — I5033 Acute on chronic diastolic (congestive) heart failure: Secondary | ICD-10-CM

## 2012-04-10 DIAGNOSIS — I6350 Cerebral infarction due to unspecified occlusion or stenosis of unspecified cerebral artery: Secondary | ICD-10-CM

## 2012-04-10 DIAGNOSIS — I4891 Unspecified atrial fibrillation: Secondary | ICD-10-CM

## 2012-04-10 DIAGNOSIS — I350 Nonrheumatic aortic (valve) stenosis: Secondary | ICD-10-CM

## 2012-04-10 DIAGNOSIS — Z7901 Long term (current) use of anticoagulants: Secondary | ICD-10-CM

## 2012-04-10 DIAGNOSIS — I509 Heart failure, unspecified: Secondary | ICD-10-CM

## 2012-04-10 DIAGNOSIS — I639 Cerebral infarction, unspecified: Secondary | ICD-10-CM

## 2012-04-10 MED ORDER — TORSEMIDE 20 MG PO TABS: 20.0000 mg | ORAL_TABLET | Freq: Every day | ORAL | Status: DC | PRN

## 2012-04-10 MED ORDER — AMLODIPINE BESYLATE 5 MG PO TABS: 5.0000 mg | ORAL_TABLET | Freq: Every day | ORAL | Status: DC

## 2012-04-10 NOTE — Assessment & Plan Note (Signed)
Maintaining normal sinus rhythm 

## 2012-04-10 NOTE — Progress Notes (Signed)
Patient ID: Scott Norman, male    DOB: 1930/07/05, 78 y.o.   MRN: [id number]  HPI Comments: 78 yo with history of paroxysmal atrial fibrillation, CKD, and mild to moderate aortic stenosis, recent hospitalization for large cardioembolic stroke ([place] in 5/12 with a left MCA cardioembolic stroke),   treated with tPA, noted to be in atrial fibrillation  while in the hospital with residual profound expressive aphasia, now on coumadin,  with weight gain, worsening edema and abdominal swelling in 2012 , Severe hypertension, found to have renal artery stenosis, now status post stent placement. He presents for routine followup  He reports that he has gained weight recently at rehabilitation. He is recovering from total knee replacement on the right. He has worsening edema on the left. Edema on the right has improved with leg wraps. Blood pressure continues to be labile. He recently restarted Lasix at home for worsening left leg edema. Weight is up from 202 pounds, approximately 10 pounds. His family reports that he is less anxious with less stress at home. The stress from his wife has somewhat resolved as she has moved out.  EKG shows normal sinus rhythm with rate 62 beats per minute with right bundle branch block, nonspecific ST abnormality    Outpatient Encounter Prescriptions as of 04/10/2012  Medication Sig Dispense Refill  . amLODipine (NORVASC) 5 MG tablet Take 5 mg by mouth daily.      . chlorhexidine (PERIDEX) 0.12 % solution Use as directed 5 mLs in the mouth or throat Daily.       Marland Kitchen dexamethasone (DECADRON) 2 MG tablet Take 2 tablets (4 mg total) by mouth daily.  30 tablet  0  . diazepam (VALIUM) 5 MG tablet Take 2.5 mg by mouth 2 (two) times daily before lunch and supper.       . doxazosin (CARDURA) 4 MG tablet Take 1 tablet (4 mg total) by mouth at bedtime.  30 tablet  3  . fenofibrate (TRICOR) 145 MG tablet Take 1 tablet (145 mg total) by mouth daily.  30 tablet  6  . furosemide  (LASIX) 40 MG tablet Take 0.5 mg tablets (20 mg total) by mouth 3 (three) times a week.      Marland Kitchen glucose blood (BAYER CONTOUR NEXT TEST) test strip Use as instructed  100 each  12  . hydrALAZINE (APRESOLINE) 100 MG tablet Take 1.5 tablets (150 mg total) by mouth 4 (four) times daily.  135 tablet  6  . isosorbide mononitrate (IMDUR) 60 MG 24 hr tablet Take 60 mg by mouth See admin instructions. Take 1 tablet at bed time.  If hypertension in am then take one as needed in am      . mirtazapine (REMERON) 7.5 mg TABS Take 7.5 mg by mouth at bedtime.      . nitroGLYCERIN (NITROSTAT) 0.4 MG SL tablet Place 0.4 mg under the tongue every 5 (five) minutes as needed. For chest pain      . nystatin (MYCOSTATIN) powder Apply topically 3 (three) times daily. Clean bilateral groin area well with soap and water.  Dry.  Apply powder to rash  60 g  0  . torsemide (DEMADEX) 5 MG tablet Take 5 mg by mouth daily.      Marland Kitchen warfarin (COUMADIN) 5 MG tablet Take 5 mg by mouth daily. 5mg  every day except for Wednesday and Friday take 7.5mg       . [DISCONTINUED] furosemide (LASIX) 40 MG tablet Take 0.5 tablets (20 mg  total) by mouth 3 (three) times a week.  30 tablet  3      Review of Systems  HENT: Negative.   Eyes: Negative.   Respiratory: Negative.   Cardiovascular: Positive for leg swelling.  Gastrointestinal: Negative.   Musculoskeletal: Positive for gait problem.  Skin: Negative.   Neurological: Negative.        Speech difficulty  Hematological: Negative.   Psychiatric/Behavioral: Negative.   All other systems reviewed and are negative.   BP 102/50  Pulse 62  Ht 6' (1.829 m)  Wt 211 lb (95.709 kg)  BMI 28.62 kg/m2  Physical Exam  Nursing note and vitals reviewed. Constitutional: He is oriented to person, place, and time. He appears well-developed and well-nourished.  HENT:  Head: Normocephalic.  Nose: Nose normal.  Mouth/Throat: Oropharynx is clear and moist.  Eyes: Conjunctivae normal are normal.  Pupils are equal, round, and reactive to light.  Neck: Normal range of motion. Neck supple. No JVD present. Carotid bruit is present.  Cardiovascular: Normal rate, S1 normal, S2 normal and intact distal pulses.  An irregularly irregular rhythm present.  Occasional extrasystoles are present. Exam reveals no gallop and no friction rub.   Murmur heard.  Crescendo systolic murmur is present with a grade of 2/6  Pulmonary/Chest: Effort normal and breath sounds normal. No respiratory distress. He has no wheezes. He has no rales. He exhibits no tenderness.  Abdominal: Soft. Bowel sounds are normal. He exhibits no distension. There is no tenderness.  Musculoskeletal: Normal range of motion. He exhibits no edema and no tenderness.  Lymphadenopathy:    He has no cervical adenopathy.  Neurological: He is alert and oriented to person, place, and time. Coordination normal.  Skin: Skin is warm and dry. No rash noted. No erythema.  Psychiatric: He has a normal mood and affect. His behavior is normal. Judgment and thought content normal.           Assessment and Plan

## 2012-04-10 NOTE — Assessment & Plan Note (Signed)
Mild to moderate by previous echocardiogram. This could be contributing to underlying fluid retention.

## 2012-04-10 NOTE — Patient Instructions (Addendum)
You are doing well.  Please continue torsemide (water pill) for edema and weight gain. Hold hydralazine as needed for low blood pressure   Please call us if you have new issues that need to be addressed before your next appt.  Your physician wants you to follow-up in: 3 months.  You will receive a reminder letter in the mail two months in advance. If you don't receive a letter, please call our office to schedule the follow-up appointment.

## 2012-04-10 NOTE — Assessment & Plan Note (Signed)
He recently restarted his diuretic at home. He was on torsemide at rehabilitation and has been taking Lasix at home. We have suggested he go back on torsemide 20 mg daily. This could be held when edema significantly improves.

## 2012-04-10 NOTE — Assessment & Plan Note (Signed)
We are asked him to continue monitoring blood pressure. It is low today. We have suggested they adjust hydralazine as needed for low on high blood pressure.

## 2012-04-12 ENCOUNTER — Encounter: Payer: Self-pay | Admitting: Internal Medicine

## 2012-04-12 ENCOUNTER — Telehealth: Payer: Self-pay | Admitting: Internal Medicine

## 2012-04-12 ENCOUNTER — Ambulatory Visit (INDEPENDENT_AMBULATORY_CARE_PROVIDER_SITE_OTHER): Payer: Medicare Other | Admitting: Internal Medicine

## 2012-04-12 VITALS — BP 180/64 | HR 63 | Temp 98.1°F | Resp 12 | Ht 72.0 in | Wt 215.5 lb

## 2012-04-12 DIAGNOSIS — E1129 Type 2 diabetes mellitus with other diabetic kidney complication: Secondary | ICD-10-CM

## 2012-04-12 DIAGNOSIS — I10 Essential (primary) hypertension: Secondary | ICD-10-CM

## 2012-04-12 DIAGNOSIS — B3789 Other sites of candidiasis: Secondary | ICD-10-CM

## 2012-04-12 DIAGNOSIS — E1349 Other specified diabetes mellitus with other diabetic neurological complication: Secondary | ICD-10-CM

## 2012-04-12 DIAGNOSIS — E08610 Diabetes mellitus due to underlying condition with diabetic neuropathic arthropathy: Secondary | ICD-10-CM

## 2012-04-12 DIAGNOSIS — E1121 Type 2 diabetes mellitus with diabetic nephropathy: Secondary | ICD-10-CM

## 2012-04-12 DIAGNOSIS — IMO0002 Reserved for concepts with insufficient information to code with codable children: Secondary | ICD-10-CM

## 2012-04-12 DIAGNOSIS — N058 Unspecified nephritic syndrome with other morphologic changes: Secondary | ICD-10-CM

## 2012-04-12 LAB — HEMOGLOBIN A1C: Hgb A1c MFr Bld: 6 % (ref 4.6–6.5)

## 2012-04-12 MED ORDER — DEXAMETHASONE 0.75 MG PO TABS: ORAL_TABLET | ORAL | Status: DC

## 2012-04-12 NOTE — Patient Instructions (Addendum)
Continue using the nystatin powder on the right groin area twice daily until the redness resolves.   We need to figure out who prescribed dexamethasone and why, so we can start tapering you off of it   We will recheck your hgba1c today   I do not advise using glipizide at this time unless I call you and tell you to use it, but that will require checking blood sugars

## 2012-04-12 NOTE — Telephone Encounter (Signed)
i have sent an rx to pharmacy for a steroid taper he can start tomorrow or the next day .  It will take 20 days.  If they hve any questions., the directions are:  3 tablets daily for 5 days,  Then 2 tablets daily for 5 days,  Then 1 tablet daily for 5 days,  Then 1/2 tablet daily for 5 days,  Then stop

## 2012-04-12 NOTE — Progress Notes (Signed)
Patient ID: Scott Norman, male   DOB: 03-09-1931, 78 y.o.   MRN: [id number]   Patient Active Problem List  Diagnosis  . Atrial fibrillation  . Aortic stenosis  . Hyperlipidemia  . Hypertension  . Bradycardia  . Encounter for long-term (current) use of anticoagulants  . CVA (cerebrovascular accident)  . Type II diabetes mellitus with nephropathy  . Right renal artery stenosis  . Valvular cardiomyopathy  . Screening for osteoporosis  . Iron deficiency anemia  . Dysphagia as late effect of stroke  . Malaise  . Degenerative disc disease, lumbar  . PVD (peripheral vascular disease)  . Chronic kidney disease (CKD), stage III (moderate)  . Stroke  . Renal artery stenosis, native, bilateral  . Anxiety  . Sleep apnea  . Right knee DJD  . Postoperative anemia due to acute blood loss  . Candida rash of groin  . Encounter for long-term (current) use of steroids    Subjective:  CC:   Chief Complaint  Patient presents with  . Follow-up    HPI:   Scott Norman a 78 y.o. male who presents for follow up after undergoing  total knee replacement.  He was dc'd last Monday from rehab, and has gained a few lbs.  Now receiving outpatient PT 3 itmes weekly.  Daughter has noticed an inguinal and rash under his pannus.  Has been treating with nystatin .  He is still having labile blood pressure and is checking bp 4 times a day: morning pre meds.  Lunchtime pre meds,  5 pm pre meds and evening pre meds. .  Has noted a mild  improvement since the surgery until today when it was 200 systolic.   2) was started on dexamethasone on oct 3 for ?"inflammation" (per rehab [facility]) by Einar Crow,  The physician in charge of the rehab facility , but it was never explained to family and has not been stopped..3) has developed a pressure ulcer on the back of the heel due to the sponge that ortho had him sleeping itn to prevent rotation of the foot and knee.  Dr Lajoyce Corners is treating it.  4) History of diet  controlled diabetese with nor recent follow up , was given  insulin shots while in house and in rehab and was prescribed glipizide at discharge which he has not been taking.      Past Medical History  Diagnosis Date  . Diabetes mellitus   . Degenerative disc disease, lumbar     back surgery 2011  . Gout   . Atrial fibrillation   . PVD (peripheral vascular disease)   . Aortic stenosis   . Hyperlipidemia   . Chronic kidney disease (CKD), stage III (moderate)   . H/O colonoscopy June 2007    normal  . Screening for osteoporosis Feb 2012    T -1.3 hip  . Stroke 2012    Rmbloic, with expressive aphasia  . Renal artery stenosis, native, bilateral     s/p right RA stent 2012, pending left   . Heart murmur   . Anxiety   . Sleep apnea     does not use CPAP  . Right knee DJD   . Hypertension     dr tim gollan   [facility] [place]  . Right knee DJD 02/28/2012    Past Surgical History  Procedure Date  . Shoulder surgery   . Lumbar spine surgery   . Renal artery stent 04/2011  . Tonsillectomy   .  Appendectomy   . Throat surgery 1993    excess tissue removed to help with snoring  . Cardiac catheterization   . Total knee arthroplasty 02/28/2012    Procedure: TOTAL KNEE ARTHROPLASTY;  Surgeon: Nilda Simmer, MD;  Location: [facility] OR;  Service: Orthopedics;  Laterality: Right;         The following portions of the patient's history were reviewed and updated as appropriate: Allergies, current medications, and problem list.    Review of Systems:   12 Pt  review of systems was negative except those addressed in the HPI,     History   Social History  . Marital Status: Legally Separated    Spouse Name: N/A    Number of Children: N/A  . Years of Education: N/A   Occupational History  . retired    Social History Main Topics  . Smoking status: Never Smoker   . Smokeless tobacco: Never Used  . Alcohol Use: 1.2 oz/week    2 Glasses of wine per week     Comment: rarely  .  Drug Use: No  . Sexually Active: No   Other Topics Concern  . Not on file   Social History Narrative   Lives with second wife,  often left alone for hours on end.  ,  Meals delayed by wife's absence from hone until 7 or 8 PM.  Daughter [name] actively involved , Ambulates with walker.     Objective:  BP 180/64  Pulse 63  Temp 98.1 F (36.7 C) (Oral)  Resp 12  Ht 6' (1.829 m)  Wt 215 lb 8 oz (97.75 kg)  BMI 29.23 kg/m2  SpO2 97%  General appearance: alert, cooperative and appears stated age Ears: normal TM's and external ear canals both ears Throat: lips, mucosa, and tongue normal; teeth and gums normal Neck: no adenopathy, no carotid bruit, supple, symmetrical, trachea midline and thyroid not enlarged, symmetric, no tenderness/mass/nodules Back: symmetric, no curvature. ROM normal. No CVA tenderness. Lungs: clear to auscultation bilaterally Heart: regular rate and rhythm, S1, S2 normal, no murmur, click, rub or gallop Abdomen: soft, non-tender; bowel sounds normal; no masses,  no organomegaly Pulses: 2+ and symmetric Skin: Skin color, texture, turgor normal. No rashes or lesions Lymph nodes: Cervical, supraclavicular, and axillary nodes normal.  Assessment and Plan:  Type II diabetes mellitus with nephropathy Currently managed on diet alone.Well controlled by current AIC. The elevations seen at rehab were due to use f dexamethasone and will resolve with cessation of steroids. He is not on an ACE inhibitor or ARB secondary to renal insufficiency. He is up to date on annual eye exams  Hypertension Still labile despite resolution of RAS, mitigation of marital stressors, and relief of knee pain .  Hopefully tapering the steroid off will improve his control. Untreated sleep apnea is certainly playing a role , as is anxiety.   Candida rash of groin Nystatin powder bid to affected area.,  Pack pannus areas with gauze to allow it to dry  Encounter for long-term (current) use  of steroids After considerable research it appears he was placed on dexamethasone over a month ago by the rehab faciity's MD and it was never stopped.  3 week taper prescribed.    Updated Medication List Outpatient Encounter Prescriptions as of 04/12/2012  Medication Sig Dispense Refill  . amLODipine (NORVASC) 5 MG tablet Take 1 tablet (5 mg total) by mouth daily.  90 tablet  6  . chlorhexidine (PERIDEX) 0.12 % solution  Use as directed 5 mLs in the mouth or throat Daily.       . cholecalciferol (VITAMIN D) 1000 UNITS tablet Take 2,000 Units by mouth daily.      . cyclobenzaprine (FLEXERIL) 5 MG tablet Take 5 mg by mouth every 8 (eight) hours as needed.      . diazepam (VALIUM) 5 MG tablet Take 2.5 mg by mouth 2 (two) times daily before lunch and supper.       . doxazosin (CARDURA) 4 MG tablet Take 1 tablet (4 mg total) by mouth at bedtime.  30 tablet  3  . fenofibrate (TRICOR) 145 MG tablet Take 1 tablet (145 mg total) by mouth daily.  30 tablet  6  . glucose blood (BAYER CONTOUR NEXT TEST) test strip Use as instructed  100 each  12  . hydrALAZINE (APRESOLINE) 100 MG tablet Take 1.5 tablets (150 mg total) by mouth 4 (four) times daily.  135 tablet  6  . isosorbide mononitrate (IMDUR) 60 MG 24 hr tablet Take 60 mg by mouth See admin instructions. Take 1 tablet at bed time.  If hypertension in am then take one as needed in am      . mirtazapine (REMERON) 7.5 mg TABS Take 7.5 mg by mouth at bedtime.      . nitroGLYCERIN (NITROSTAT) 0.4 MG SL tablet Place 0.4 mg under the tongue every 5 (five) minutes as needed. For chest pain      . nystatin (MYCOSTATIN) powder Apply topically 3 (three) times daily. Clean bilateral groin area well with soap and water.  Dry.  Apply powder to rash  60 g  0  . polyethylene glycol (MIRALAX / GLYCOLAX) packet Take 17 g by mouth daily.      Marland Kitchen torsemide (DEMADEX) 20 MG tablet Take 1 tablet (20 mg total) by mouth daily as needed.  90 tablet  6  . warfarin (COUMADIN) 5 MG  tablet Take 5 mg by mouth daily. 5mg  every day except for Wednesday and Friday take 7.5mg       . [DISCONTINUED] dexamethasone (DECADRON) 2 MG tablet Take 2 tablets (4 mg total) by mouth daily.  30 tablet  0     Orders Placed This Encounter  Procedures  . Hemoglobin A1c    No Follow-up on file.

## 2012-04-12 NOTE — Telephone Encounter (Signed)
Pt's wife is calling back and she says Dr. Gaynell Face from [facility] put him on Dexamthasone. They were not sure how long he should be on that. She said Dr. Darrick Huntsman wanted to know that information

## 2012-04-13 NOTE — Telephone Encounter (Signed)
Left message on daughter lisa lane vm with instructions also mailed instructions to patient daughter.

## 2012-04-14 ENCOUNTER — Ambulatory Visit: Payer: Self-pay | Admitting: Oncology

## 2012-04-14 LAB — CANCER CENTER HEMOGLOBIN: HGB: 10.8 g/dL — ABNORMAL LOW (ref 13.0–18.0)

## 2012-04-15 ENCOUNTER — Encounter: Payer: Self-pay | Admitting: Internal Medicine

## 2012-04-15 DIAGNOSIS — IMO0002 Reserved for concepts with insufficient information to code with codable children: Secondary | ICD-10-CM | POA: Insufficient documentation

## 2012-04-15 DIAGNOSIS — B3789 Other sites of candidiasis: Secondary | ICD-10-CM | POA: Insufficient documentation

## 2012-04-15 NOTE — Assessment & Plan Note (Signed)
After considerable research it appears he was placed on dexamethasone over a month ago by the rehab faciity's MD and it was never stopped.  3 week taper prescribed.

## 2012-04-15 NOTE — Assessment & Plan Note (Signed)
Currently managed on diet alone.Well controlled by current AIC. The elevations seen at rehab were due to use f dexamethasone and will resolve with cessation of steroids. He is not on an ACE inhibitor or ARB secondary to renal insufficiency. He is up to date on annual eye exams

## 2012-04-15 NOTE — Assessment & Plan Note (Signed)
Nystatin powder bid to affected area.,  Pack pannus areas with gauze to allow it to dry

## 2012-04-15 NOTE — Assessment & Plan Note (Signed)
Still labile despite resolution of RAS, mitigation of marital stressors, and relief of knee pain .  Hopefully tapering the steroid off will improve his control. Untreated sleep apnea is certainly playing a role , as is anxiety.

## 2012-04-19 ENCOUNTER — Ambulatory Visit (INDEPENDENT_AMBULATORY_CARE_PROVIDER_SITE_OTHER): Payer: Medicare Other

## 2012-04-19 DIAGNOSIS — Z7901 Long term (current) use of anticoagulants: Secondary | ICD-10-CM

## 2012-04-19 DIAGNOSIS — I6350 Cerebral infarction due to unspecified occlusion or stenosis of unspecified cerebral artery: Secondary | ICD-10-CM

## 2012-04-19 DIAGNOSIS — I639 Cerebral infarction, unspecified: Secondary | ICD-10-CM

## 2012-04-19 DIAGNOSIS — I4891 Unspecified atrial fibrillation: Secondary | ICD-10-CM

## 2012-04-19 LAB — POCT INR: INR: 2.2

## 2012-04-24 ENCOUNTER — Telehealth: Payer: Self-pay | Admitting: Internal Medicine

## 2012-04-24 NOTE — Telephone Encounter (Signed)
Error

## 2012-04-26 ENCOUNTER — Other Ambulatory Visit: Payer: Self-pay

## 2012-04-26 MED ORDER — WARFARIN SODIUM 5 MG PO TABS: ORAL_TABLET | ORAL | Status: DC

## 2012-04-30 ENCOUNTER — Ambulatory Visit: Payer: Self-pay | Admitting: Oncology

## 2012-05-02 ENCOUNTER — Other Ambulatory Visit: Payer: Self-pay

## 2012-05-02 MED ORDER — WARFARIN SODIUM 6 MG PO TABS: ORAL_TABLET | ORAL | Status: DC

## 2012-05-03 ENCOUNTER — Ambulatory Visit (INDEPENDENT_AMBULATORY_CARE_PROVIDER_SITE_OTHER): Payer: Medicare Other

## 2012-05-03 DIAGNOSIS — Z7901 Long term (current) use of anticoagulants: Secondary | ICD-10-CM

## 2012-05-03 DIAGNOSIS — I4891 Unspecified atrial fibrillation: Secondary | ICD-10-CM

## 2012-05-03 DIAGNOSIS — I6350 Cerebral infarction due to unspecified occlusion or stenosis of unspecified cerebral artery: Secondary | ICD-10-CM

## 2012-05-03 DIAGNOSIS — I639 Cerebral infarction, unspecified: Secondary | ICD-10-CM

## 2012-05-03 LAB — POCT INR: INR: 4.2

## 2012-05-04 ENCOUNTER — Ambulatory Visit: Payer: Medicare Other | Admitting: Cardiovascular Disease

## 2012-05-05 ENCOUNTER — Ambulatory Visit (INDEPENDENT_AMBULATORY_CARE_PROVIDER_SITE_OTHER): Payer: Medicare Other | Admitting: Internal Medicine

## 2012-05-05 ENCOUNTER — Encounter: Payer: Self-pay | Admitting: Internal Medicine

## 2012-05-05 VITALS — BP 152/56 | HR 64 | Temp 98.6°F | Resp 12 | Ht 67.5 in | Wt 237.0 lb

## 2012-05-05 DIAGNOSIS — I69391 Dysphagia following cerebral infarction: Secondary | ICD-10-CM

## 2012-05-05 DIAGNOSIS — I69991 Dysphagia following unspecified cerebrovascular disease: Secondary | ICD-10-CM

## 2012-05-05 DIAGNOSIS — N058 Unspecified nephritic syndrome with other morphologic changes: Secondary | ICD-10-CM

## 2012-05-05 DIAGNOSIS — I701 Atherosclerosis of renal artery: Secondary | ICD-10-CM

## 2012-05-05 DIAGNOSIS — E1121 Type 2 diabetes mellitus with diabetic nephropathy: Secondary | ICD-10-CM

## 2012-05-05 DIAGNOSIS — L89899 Pressure ulcer of other site, unspecified stage: Secondary | ICD-10-CM

## 2012-05-05 DIAGNOSIS — E1129 Type 2 diabetes mellitus with other diabetic kidney complication: Secondary | ICD-10-CM

## 2012-05-05 DIAGNOSIS — L8993 Pressure ulcer of unspecified site, stage 3: Secondary | ICD-10-CM

## 2012-05-05 DIAGNOSIS — IMO0002 Reserved for concepts with insufficient information to code with codable children: Secondary | ICD-10-CM

## 2012-05-05 DIAGNOSIS — F419 Anxiety disorder, unspecified: Secondary | ICD-10-CM

## 2012-05-05 DIAGNOSIS — R5383 Other fatigue: Secondary | ICD-10-CM

## 2012-05-05 DIAGNOSIS — R5381 Other malaise: Secondary | ICD-10-CM

## 2012-05-05 DIAGNOSIS — F411 Generalized anxiety disorder: Secondary | ICD-10-CM

## 2012-05-05 DIAGNOSIS — L89893 Pressure ulcer of other site, stage 3: Secondary | ICD-10-CM

## 2012-05-05 MED ORDER — BUPROPION HCL ER (SR) 100 MG PO TB12: 100.0000 mg | ORAL_TABLET | Freq: Two times a day (BID) | ORAL | Status: DC

## 2012-05-05 MED ORDER — DIAZEPAM 5 MG PO TABS: 2.5000 mg | ORAL_TABLET | Freq: Two times a day (BID) | ORAL | Status: DC

## 2012-05-05 NOTE — Patient Instructions (Addendum)
The torsemide is for fluid retention from heart failure.  It is very strong and should not be combined with furosemide.  Elevate the legs as much as possible and wear the compression stocking on the left leg.   I am changing the anti depressant from mirtazipine  to wellbutrin.  Take it twice daily to start with ,  The second one by 2 pm to avoid aggravating the insomnia.

## 2012-05-05 NOTE — Progress Notes (Signed)
Patient ID: Scott Norman, male   DOB: [birth year], 78 y.o.   MRN: [id number]  Patient Active Problem List  Diagnosis  . Atrial fibrillation  . Aortic stenosis  . Hyperlipidemia  . Hypertension  . Bradycardia  . Encounter for long-term (current) use of anticoagulants  . CVA (cerebrovascular accident)  . Type II diabetes mellitus with nephropathy  . Right renal artery stenosis  . Valvular cardiomyopathy  . Screening for osteoporosis  . Iron deficiency anemia  . Dysphagia as late effect of stroke  . Malaise  . Degenerative disc disease, lumbar  . Chronic kidney disease (CKD), stage III (moderate)  . Stroke  . Renal artery stenosis, native, bilateral  . Anxiety  . Sleep apnea  . Right knee DJD  . Postoperative anemia due to acute blood loss  . Candida rash of groin  . Encounter for long-term (current) use of steroids  . Pressure ulcer of foot, stage 3    Subjective:  CC:   Chief Complaint  Patient presents with  . Mouth Lesions    HPI:   Scott Portman Fosteris a 78 y.o. male who presents Several issues.  1) His right foot ulcer which occurred after his knee replacement was debrided and wrapped by Dr. Lajoyce Corners yesterday per referral from Dr. Thurston Hole.  Per caregiver he does not want it unwrapped.  He has follow up on Monday. Daughter and caregiver are concerned that it is not improving. 2) His depression has not responded to mirtazapine and he is gaining weight. Daughter is requesting an alternative. 3) Persistent lower extremity edema. Weight has been stable. Caregiver has been applying compression stocking to the left leg after patient has already been up and around and having a difficult time getting patient to recline  5) Blood pressures continue to be quite labile with recent systolics in the 200 range . His daughter has been reluctant to use the nitroglycerin tablets but the caregiver has placed them in his prn box.  Patient understands not to use more than 2.    Past Medical History   Diagnosis Date  . Diabetes mellitus   . Degenerative disc disease, lumbar     back surgery 2011  . Gout   . Atrial fibrillation   . PVD (peripheral vascular disease)   . Aortic stenosis   . Hyperlipidemia   . Chronic kidney disease (CKD), stage III (moderate)   . H/O colonoscopy June 2007    normal  . Screening for osteoporosis Feb 2012    T -1.3 hip  . Stroke 2012    Rmbloic, with expressive aphasia  . Renal artery stenosis, native, bilateral     s/p right RA stent 2012, pending left   . Heart murmur   . Anxiety   . Sleep apnea     does not use CPAP  . Right knee DJD   . Hypertension     dr tim gollan   [facility] [place]  . Right knee DJD 02/28/2012    Past Surgical History  Procedure Date  . Shoulder surgery   . Lumbar spine surgery   . Renal artery stent 04/2011  . Tonsillectomy   . Appendectomy   . Throat surgery 1993    excess tissue removed to help with snoring  . Cardiac catheterization   . Total knee arthroplasty 02/28/2012    Procedure: TOTAL KNEE ARTHROPLASTY;  Surgeon: Nilda Simmer, MD;  Location: [facility] OR;  Service: Orthopedics;  Laterality: Right;  The following portions of the patient's history were reviewed and updated as appropriate: Allergies, current medications, and problem list.    Review of Systems:   Patient denies headache, fevers, malaise, unintentional weight loss, skin rash, eye pain, sinus congestion and sinus pain, sore throat, dysphagia,  hemoptysis , cough, dyspnea, wheezing, chest pain, palpitations, orthopnea, edema, abdominal pain, nausea, melena, diarrhea, constipation, flank pain, dysuria, hematuria, urinary  Frequency, nocturia, numbness, tingling, seizures,  Focal weakness, Loss of consciousness,  Tremor, insomnia, depression, anxiety, and suicidal ideation.     History   Social History  . Marital Status: Legally Separated    Spouse Name: N/A    Number of Children: N/A  . Years of Education: N/A   Occupational  History  . retired    Social History Main Topics  . Smoking status: Never Smoker   . Smokeless tobacco: Never Used  . Alcohol Use: 1.2 oz/week    2 Glasses of wine per week     Comment: rarely  . Drug Use: No  . Sexually Active: No   Other Topics Concern  . Not on file   Social History Narrative   Lives with second wife,  often left alone for hours on end.  ,  Meals delayed by wife's absence from hone until 7 or 8 PM.  Daughter [name] actively involved , Ambulates with walker.     Objective:  BP 152/56  Pulse 64  Temp 98.6 F (37 C) (Oral)  Resp 12  Ht 5' 7.5" (1.715 m)  Wt 237 lb (107.502 kg)  BMI 36.57 kg/m2  SpO2 96%  General appearance: alert, cooperative and appears stated age Ears: normal TM's and external ear canals both ears Throat: lips, mucosa, and tongue normal; teeth and gums normal Neck: no adenopathy, no carotid bruit, supple, symmetrical, trachea midline and thyroid not enlarged, symmetric, no tenderness/mass/nodules Back: symmetric, no curvature. ROM normal. No CVA tenderness. Lungs: clear to auscultation bilaterally Heart: regular rate and rhythm, S1, S2 normal, no murmur, click, rub or gallop Abdomen: soft, non-tender; bowel sounds normal; no masses,  no organomegaly Pulses: 2+ and symmetric Skin: Skin color, texture, turgor normal. No rashes or lesions Lymph nodes: Cervical, supraclavicular, and axillary nodes normal.  Assessment and Plan:  Anxiety Managed with low dose valium twice daily.  Dysphagia as late effect of stroke Improved.  No recent episodes of chocking or regurgitation.   Encounter for long-term (current) use of steroids Steroid taper has been done and he is on the final week.   Malaise No improvement with mirtazipine and weight gain noted.  Daughter requesting an alternative. wellbutrin  started.   Type II diabetes mellitus with nephropathy Recent hgba1c indicated no need for medications. He is getting regular eye care,   Foot care and on the appropriate medications   Pressure ulcer of foot, stage 3 S/p debridement by Dr. Lajoyce Corners.   Renal artery stenosis, native, bilateral With prior intervention  On the right,  Blood pressure remains quite labile aggravated by anxiety.    Updated Medication List Outpatient Encounter Prescriptions as of 05/05/2012  Medication Sig Dispense Refill  . amLODipine (NORVASC) 5 MG tablet Take 1 tablet (5 mg total) by mouth daily.  90 tablet  6  . chlorhexidine (PERIDEX) 0.12 % solution Use as directed 5 mLs in the mouth or throat Daily.       . cholecalciferol (VITAMIN D) 1000 UNITS tablet Take 2,000 Units by mouth daily.      . cyclobenzaprine (FLEXERIL)  5 MG tablet Take 5 mg by mouth every 8 (eight) hours as needed.      Marland Kitchen dexamethasone (DECADRON) 0.75 MG tablet 3 tablets daily for 5 days, then 2 tablets daily for 5 days, then 1 tablet daily for 5 days,  Then 1/2 tablet daily for 5 days, then stop  27.5 tablet  0  . diazepam (VALIUM) 5 MG tablet Take 0.5 tablets (2.5 mg total) by mouth 2 (two) times daily before lunch and supper.  60 tablet  5  . doxazosin (CARDURA) 4 MG tablet Take 1 tablet (4 mg total) by mouth at bedtime.  30 tablet  3  . fenofibrate (TRICOR) 145 MG tablet Take 1 tablet (145 mg total) by mouth daily.  30 tablet  6  . glucose blood (BAYER CONTOUR NEXT TEST) test strip Use as instructed  100 each  12  . hydrALAZINE (APRESOLINE) 100 MG tablet Take 1.5 tablets (150 mg total) by mouth 4 (four) times daily.  135 tablet  6  . isosorbide mononitrate (IMDUR) 60 MG 24 hr tablet Take 60 mg by mouth See admin instructions. Take 1 tablet at bed time.  If hypertension in am then take one as needed in am      . nitroGLYCERIN (NITROSTAT) 0.4 MG SL tablet Place 0.4 mg under the tongue every 5 (five) minutes as needed. For chest pain      . nystatin (MYCOSTATIN) powder Apply topically 3 (three) times daily. Clean bilateral groin area well with soap and water.  Dry.  Apply powder to  rash  60 g  0  . polyethylene glycol (MIRALAX / GLYCOLAX) packet Take 17 g by mouth daily.      Marland Kitchen torsemide (DEMADEX) 20 MG tablet Take 1 tablet (20 mg total) by mouth daily as needed.  90 tablet  6  . warfarin (COUMADIN) 5 MG tablet Take as directed by anticoagulation clinic  30 tablet  3  . warfarin (COUMADIN) 6 MG tablet Take as directed by anticoagulation clinic  35 tablet  2  . [DISCONTINUED] diazepam (VALIUM) 5 MG tablet Take 2.5 mg by mouth 2 (two) times daily before lunch and supper.       . [DISCONTINUED] mirtazapine (REMERON) 7.5 mg TABS Take 7.5 mg by mouth at bedtime.      Marland Kitchen buPROPion (WELLBUTRIN SR) 100 MG 12 hr tablet Take 1 tablet (100 mg total) by mouth 2 (two) times daily.  60 tablet  3     No orders of the defined types were placed in this encounter.    No Follow-up on file.

## 2012-05-07 ENCOUNTER — Encounter: Payer: Self-pay | Admitting: Internal Medicine

## 2012-05-07 DIAGNOSIS — L89893 Pressure ulcer of other site, stage 3: Secondary | ICD-10-CM | POA: Insufficient documentation

## 2012-05-07 NOTE — Assessment & Plan Note (Signed)
Recent hgba1c indicated no need for medications. He is getting regular eye care,  Foot care and on the appropriate medications

## 2012-05-07 NOTE — Assessment & Plan Note (Signed)
With prior intervention  On the right,  Blood pressure remains quite labile aggravated by anxiety.

## 2012-05-07 NOTE — Assessment & Plan Note (Signed)
S/p debridement by Dr. Lajoyce Corners.

## 2012-05-07 NOTE — Assessment & Plan Note (Signed)
No improvement with mirtazipine and weight gain noted.  Daughter requesting an alternative. wellbutrin  started.

## 2012-05-07 NOTE — Assessment & Plan Note (Signed)
Steroid taper has been done and he is on the final week.

## 2012-05-07 NOTE — Assessment & Plan Note (Signed)
Improved.  No recent episodes of chocking or regurgitation.

## 2012-05-07 NOTE — Assessment & Plan Note (Signed)
Managed with low dose valium twice daily.

## 2012-05-08 ENCOUNTER — Telehealth: Payer: Self-pay | Admitting: Cardiovascular Disease

## 2012-05-08 NOTE — Telephone Encounter (Signed)
Pt caregiver calling states that pt is feeling very fatigue. Has some swelling. Pt has had some coughing with mucus. Getting choked a lot also.. Not sure if pt has pneumonia again.

## 2012-05-08 NOTE — Telephone Encounter (Signed)
[name], pt's caregiver, says pt has had some new onset fatigue, productive cough and sob. Says pt is "acting as if he has pneumonia again". Pt was seen by Dr. Darrick Huntsman on Friday and was not feeling this way.  Says these symptoms appeared over w/e and pt is extremely fatigued.  I advised pt see PCP today but [name] says he has appt with wound MD for ulcer on foot today at 3:30 and appt usually take 1 hour. She asks if PCP can see pt tomm am I had her hold while I made appt with PCP's PA at 0915 tomm am [name] verb understanding and confirms appt I advised her to call our office between now and then if symptoms change/worsen, she should call us back. Understanding verb

## 2012-05-09 ENCOUNTER — Ambulatory Visit: Payer: Medicare Other | Admitting: Adult Health

## 2012-05-09 ENCOUNTER — Encounter: Payer: Self-pay | Admitting: Adult Health

## 2012-05-09 ENCOUNTER — Ambulatory Visit (INDEPENDENT_AMBULATORY_CARE_PROVIDER_SITE_OTHER): Payer: Medicare Other | Admitting: Adult Health

## 2012-05-09 VITALS — BP 179/60 | HR 58 | Temp 98.3°F | Ht 68.0 in | Wt 228.0 lb

## 2012-05-09 DIAGNOSIS — Z7901 Long term (current) use of anticoagulants: Secondary | ICD-10-CM

## 2012-05-09 DIAGNOSIS — J189 Pneumonia, unspecified organism: Secondary | ICD-10-CM

## 2012-05-09 LAB — PROTIME-INR: Prothrombin Time: 27.8 s — ABNORMAL HIGH (ref 10.2–12.4)

## 2012-05-09 MED ORDER — LEVOFLOXACIN 250 MG PO TABS: 250.0000 mg | ORAL_TABLET | Freq: Every day | ORAL | Status: DC

## 2012-05-09 NOTE — Patient Instructions (Addendum)
  Please have your PT/INR drawn prior to leaving the office. We will call you this afternoon if we need to adjust your coumadin.   You will start Levaquin today. The tablets should be 250 mg and he should have 2 tablets the first day and then 1 tablet every 24 hours for the next 6 days. He has been dosed for his renal function.  I have referred you to Speech Therapy at [facility]. We will call you with the appointment.  Please call if your symptoms do not improve in the next 2-3 days or if your symptoms worsen.

## 2012-05-09 NOTE — Progress Notes (Signed)
Subjective:    Patient ID: Scott Norman, male    DOB: 06-Jul-1930, 78 y.o.   MRN: [id number]  HPI  Patient is a 78 y/o patient with multiple medical problems including CVA with dysphagia as late effect, expressive aphasia, HTN, HLD, afib, AS, DM type II with nephropathy, renal artery stenosis who presents to the office with 2 week hx of "not feeling good". Reports coughing "when he eats" and coughing up food and mucus. He also reports recently having all of his lower teeth removed 2/2 infection and does not have dentures in place.  His cough is not worse at night. Denies fever. He comes in with his caregiver, [name].   Current Outpatient Prescriptions on File Prior to Visit  Medication Sig Dispense Refill  . amLODipine (NORVASC) 5 MG tablet Take 1 tablet (5 mg total) by mouth daily.  90 tablet  6  . buPROPion (WELLBUTRIN SR) 100 MG 12 hr tablet Take 1 tablet (100 mg total) by mouth 2 (two) times daily.  60 tablet  3  . chlorhexidine (PERIDEX) 0.12 % solution Use as directed 5 mLs in the mouth or throat Daily.       . cholecalciferol (VITAMIN D) 1000 UNITS tablet Take 2,000 Units by mouth daily.      . cyclobenzaprine (FLEXERIL) 5 MG tablet Take 5 mg by mouth every 8 (eight) hours as needed.      . diazepam (VALIUM) 5 MG tablet Take 0.5 tablets (2.5 mg total) by mouth 2 (two) times daily before lunch and supper.  60 tablet  5  . doxazosin (CARDURA) 4 MG tablet Take 1 tablet (4 mg total) by mouth at bedtime.  30 tablet  3  . fenofibrate (TRICOR) 145 MG tablet Take 1 tablet (145 mg total) by mouth daily.  30 tablet  6  . glucose blood (BAYER CONTOUR NEXT TEST) test strip Use as instructed  100 each  12  . hydrALAZINE (APRESOLINE) 100 MG tablet Take 1.5 tablets (150 mg total) by mouth 4 (four) times daily.  135 tablet  6  . isosorbide mononitrate (IMDUR) 60 MG 24 hr tablet Take 60 mg by mouth See admin instructions. Take 1 tablet at bed time.  If hypertension in am then take one as needed in am       . nitroGLYCERIN (NITROSTAT) 0.4 MG SL tablet Place 0.4 mg under the tongue every 5 (five) minutes as needed. For chest pain      . nystatin (MYCOSTATIN) powder Apply topically 3 (three) times daily. Clean bilateral groin area well with soap and water.  Dry.  Apply powder to rash  60 g  0  . polyethylene glycol (MIRALAX / GLYCOLAX) packet Take 17 g by mouth daily.      Marland Kitchen torsemide (DEMADEX) 20 MG tablet Take 1 tablet (20 mg total) by mouth daily as needed.  90 tablet  6  . warfarin (COUMADIN) 5 MG tablet Take as directed by anticoagulation clinic  30 tablet  3  . warfarin (COUMADIN) 6 MG tablet Take as directed by anticoagulation clinic  35 tablet  2     Review of Systems  Constitutional: Positive for fatigue. Negative for fever and appetite change.  HENT: Positive for dental problem. Negative for rhinorrhea and postnasal drip.   Respiratory: Positive for cough. Negative for chest tightness and shortness of breath.   Cardiovascular: Negative for chest pain.  Psychiatric/Behavioral: Negative.     BP 179/60  Pulse 58  Temp 98.3 F (36.8  C) (Oral)  Ht 5\' 8"  (1.727 m)  Wt 228 lb (103.42 kg)  BMI 34.67 kg/m2  SpO2 98%     Objective:   Physical Exam  Constitutional: He is oriented to person, place, and time. He appears well-developed and well-nourished. No distress.  Neck: No tracheal deviation present.  Cardiovascular: S1 normal and S2 normal.  An irregular rhythm present. Bradycardia present.  Exam reveals no gallop, no S3, no S4 and no friction rub.   Murmur heard.  Systolic murmur is present  Pulmonary/Chest: Effort normal. No respiratory distress. He exhibits no tenderness.       Rhonchi RUL, cleared with coughing  Lymphadenopathy:    He has no cervical adenopathy.  Neurological: He is alert and oriented to person, place, and time.       Expressive aphasia  Skin: Skin is warm and dry.  Psychiatric: He has a normal mood and affect. His behavior is normal.            Assessment & Plan:

## 2012-05-09 NOTE — Assessment & Plan Note (Signed)
PT/INR recently elevated. Re-draw today. Pt started on Levaquin

## 2012-05-09 NOTE — Assessment & Plan Note (Addendum)
Presentation suspicious for aspiration pneumonia. Started levaquin and renally dose (Cr/Cl 50.36). Checked PT/INR since pt on long term anticoagulation tx. Referral to outpatient speech.

## 2012-05-10 ENCOUNTER — Other Ambulatory Visit: Payer: Self-pay | Admitting: Adult Health

## 2012-05-12 ENCOUNTER — Ambulatory Visit: Payer: Self-pay | Admitting: Internal Medicine

## 2012-05-17 ENCOUNTER — Telehealth: Payer: Self-pay | Admitting: Internal Medicine

## 2012-05-17 ENCOUNTER — Ambulatory Visit (INDEPENDENT_AMBULATORY_CARE_PROVIDER_SITE_OTHER): Payer: Medicare Other

## 2012-05-17 DIAGNOSIS — Z7901 Long term (current) use of anticoagulants: Secondary | ICD-10-CM

## 2012-05-17 DIAGNOSIS — I69391 Dysphagia following cerebral infarction: Secondary | ICD-10-CM

## 2012-05-17 DIAGNOSIS — I639 Cerebral infarction, unspecified: Secondary | ICD-10-CM

## 2012-05-17 DIAGNOSIS — I4891 Unspecified atrial fibrillation: Secondary | ICD-10-CM

## 2012-05-17 DIAGNOSIS — I6350 Cerebral infarction due to unspecified occlusion or stenosis of unspecified cerebral artery: Secondary | ICD-10-CM

## 2012-05-17 NOTE — Telephone Encounter (Signed)
Spoke to patient daughter gave her directions as instructed.

## 2012-05-17 NOTE — Telephone Encounter (Signed)
Is swallow study showed normal swallow function and no aspiration. He does need to sit up straight while  Eating, take small bites and sips, alternate between liquids and solids and eat slowly.

## 2012-05-31 ENCOUNTER — Ambulatory Visit: Payer: Self-pay | Admitting: Oncology

## 2012-06-01 ENCOUNTER — Ambulatory Visit (INDEPENDENT_AMBULATORY_CARE_PROVIDER_SITE_OTHER): Payer: Medicare Other

## 2012-06-01 DIAGNOSIS — I639 Cerebral infarction, unspecified: Secondary | ICD-10-CM

## 2012-06-01 DIAGNOSIS — Z7901 Long term (current) use of anticoagulants: Secondary | ICD-10-CM

## 2012-06-01 DIAGNOSIS — I4891 Unspecified atrial fibrillation: Secondary | ICD-10-CM

## 2012-06-01 DIAGNOSIS — I6350 Cerebral infarction due to unspecified occlusion or stenosis of unspecified cerebral artery: Secondary | ICD-10-CM

## 2012-06-01 LAB — POCT INR: INR: 2.2

## 2012-06-13 ENCOUNTER — Encounter: Payer: Self-pay | Admitting: Internal Medicine

## 2012-06-13 LAB — CBC CANCER CENTER
Basophil %: 0.9 %
Eosinophil #: 0.2 x10 3/mm (ref 0.0–0.7)
Eosinophil %: 2.2 %
HGB: 11.8 g/dL — ABNORMAL LOW (ref 13.0–18.0)
Lymphocyte %: 20.2 %
MCH: 29.2 pg (ref 26.0–34.0)
MCV: 87 fL (ref 80–100)
Monocyte #: 0.5 x10 3/mm (ref 0.2–1.0)
Monocyte %: 7.3 %
Neutrophil #: 4.9 x10 3/mm (ref 1.4–6.5)
Neutrophil %: 69.4 %
RBC: 4.02 10*6/uL — ABNORMAL LOW (ref 4.40–5.90)
RDW: 15 % — ABNORMAL HIGH (ref 11.5–14.5)

## 2012-06-13 LAB — IRON AND TIBC: Unbound Iron-Bind.Cap.: 200 ug/dL

## 2012-06-21 ENCOUNTER — Ambulatory Visit (INDEPENDENT_AMBULATORY_CARE_PROVIDER_SITE_OTHER): Payer: Medicare Other

## 2012-06-21 DIAGNOSIS — I6350 Cerebral infarction due to unspecified occlusion or stenosis of unspecified cerebral artery: Secondary | ICD-10-CM

## 2012-06-21 DIAGNOSIS — I639 Cerebral infarction, unspecified: Secondary | ICD-10-CM

## 2012-06-21 DIAGNOSIS — Z7901 Long term (current) use of anticoagulants: Secondary | ICD-10-CM

## 2012-06-21 DIAGNOSIS — I4891 Unspecified atrial fibrillation: Secondary | ICD-10-CM

## 2012-07-01 ENCOUNTER — Ambulatory Visit: Payer: Self-pay | Admitting: Oncology

## 2012-07-05 ENCOUNTER — Other Ambulatory Visit: Payer: Self-pay | Admitting: *Deleted

## 2012-07-05 MED ORDER — HYDRALAZINE HCL 100 MG PO TABS: 150.0000 mg | ORAL_TABLET | Freq: Four times a day (QID) | ORAL | Status: DC

## 2012-07-05 NOTE — Telephone Encounter (Signed)
Refilled Hydralazine

## 2012-07-11 ENCOUNTER — Ambulatory Visit (INDEPENDENT_AMBULATORY_CARE_PROVIDER_SITE_OTHER): Payer: Medicare Other | Admitting: Cardiovascular Disease

## 2012-07-11 ENCOUNTER — Encounter: Payer: Self-pay | Admitting: Cardiovascular Disease

## 2012-07-11 VITALS — BP 170/57 | HR 62 | Ht 72.0 in | Wt 224.0 lb

## 2012-07-11 DIAGNOSIS — I509 Heart failure, unspecified: Secondary | ICD-10-CM

## 2012-07-11 DIAGNOSIS — I4891 Unspecified atrial fibrillation: Secondary | ICD-10-CM

## 2012-07-11 DIAGNOSIS — I5032 Chronic diastolic (congestive) heart failure: Secondary | ICD-10-CM | POA: Insufficient documentation

## 2012-07-11 DIAGNOSIS — I350 Nonrheumatic aortic (valve) stenosis: Secondary | ICD-10-CM

## 2012-07-11 DIAGNOSIS — I359 Nonrheumatic aortic valve disorder, unspecified: Secondary | ICD-10-CM

## 2012-07-11 DIAGNOSIS — I10 Essential (primary) hypertension: Secondary | ICD-10-CM

## 2012-07-11 DIAGNOSIS — E785 Hyperlipidemia, unspecified: Secondary | ICD-10-CM

## 2012-07-11 MED ORDER — ISOSORBIDE MONONITRATE ER 60 MG PO TB24: 60.0000 mg | ORAL_TABLET | Freq: Two times a day (BID) | ORAL | Status: DC

## 2012-07-11 MED ORDER — HYDRALAZINE HCL 100 MG PO TABS: 150.0000 mg | ORAL_TABLET | Freq: Four times a day (QID) | ORAL | Status: AC

## 2012-07-11 MED ORDER — TORSEMIDE 20 MG PO TABS: 40.0000 mg | ORAL_TABLET | Freq: Two times a day (BID) | ORAL | Status: DC

## 2012-07-11 MED ORDER — POTASSIUM CHLORIDE ER 10 MEQ PO TBCR: 10.0000 meq | EXTENDED_RELEASE_TABLET | Freq: Every day | ORAL | Status: DC

## 2012-07-11 NOTE — Patient Instructions (Addendum)
Please increase the torsemide to 2 pills in the AM, one after lunch Take with potassium one a day  Hold amlodipine (this can cause leg swelling) Increase the isosorbide to twice a day for blood pressure  Goal weight is less than 208 We can check kidney function when weight is less than 210.  Fluid restriction!!!!  Please call us if you have new issues that need to be addressed before your next appt.  Your physician wants you to follow-up in: 2 to 3 weeks

## 2012-07-11 NOTE — Assessment & Plan Note (Signed)
Not on a statin. Currently on fenofibrate

## 2012-07-11 NOTE — Assessment & Plan Note (Signed)
Maintaining normal sinus rhythm on today's visit 

## 2012-07-11 NOTE — Progress Notes (Signed)
Patient ID: Scott Norman, male    DOB: 1930/09/29, 79 y.o.   MRN: [id number]  HPI Comments: 79 yo with history of paroxysmal atrial fibrillation, CKD, and mild to moderate aortic stenosis, recent hospitalization for large cardioembolic stroke ([place] in 5/12 with a left MCA cardioembolic stroke),   treated with tPA, noted to be in atrial fibrillation  while in the hospital with residual profound expressive aphasia, now on coumadin,  with weight gain,  edema and abdominal swelling in 2012 , Severe labile hypertension, found to have renal artery stenosis,  status post stent placement. He presents for routine followup.  Since his last clinic visit, he has had weight gain, worsening edema, now with ulcers on his right lower extremity requiring a wrap. Baseline weight was 202 pounds, now up to 224. Less stress at home. He reports that he is eating more but edema is worse. He is working with physical therapy following knee replacement surgery. Denies any shortness of breath, PND or orthopnea. Blood pressure has recently been running very high, 180 systolic at times though still labile.  EKG shows normal sinus rhythm with rate 62 beats per minute with right bundle branch block, nonspecific ST abnormality    Outpatient Encounter Prescriptions as of 07/11/2012  Medication Sig Dispense Refill  . amLODipine (NORVASC) 5 MG tablet Take 1 tablet (5 mg total) by mouth daily.  90 tablet  6  . buPROPion (WELLBUTRIN SR) 100 MG 12 hr tablet Take 1 tablet (100 mg total) by mouth 2 (two) times daily.  60 tablet  3  . cholecalciferol (VITAMIN D) 1000 UNITS tablet Take 2,000 Units by mouth daily.      . diazepam (VALIUM) 5 MG tablet Take 5 mg by mouth daily.      Marland Kitchen doxazosin (CARDURA) 4 MG tablet Take 1 tablet (4 mg total) by mouth at bedtime.  30 tablet  3  . fenofibrate (TRICOR) 145 MG tablet Take 1 tablet (145 mg total) by mouth daily.  30 tablet  6  . glucose blood (BAYER CONTOUR NEXT TEST) test strip Use as  instructed  100 each  12  . hydrALAZINE (APRESOLINE) 100 MG tablet Take 1.5 tablets (150 mg total) by mouth 4 (four) times daily.  135 tablet  3  . isosorbide mononitrate (IMDUR) 60 MG 24 hr tablet Take 60 mg by mouth See admin instructions. Take 1 tablet at bed time.  If hypertension in am then take one as needed in am      . nitroGLYCERIN (NITROSTAT) 0.4 MG SL tablet Place 0.4 mg under the tongue every 5 (five) minutes as needed. For chest pain      . polyethylene glycol (MIRALAX / GLYCOLAX) packet Take 17 g by mouth as needed.       . torsemide (DEMADEX) 20 MG tablet Take 1 tablet (20 mg total) by mouth daily as needed.  90 tablet  6  . warfarin (COUMADIN) 6 MG tablet Take as directed by anticoagulation clinic  35 tablet  2  . [DISCONTINUED] diazepam (VALIUM) 5 MG tablet Take 0.5 tablets (2.5 mg total) by mouth 2 (two) times daily before lunch and supper.  60 tablet  5     Review of Systems  Constitutional: Negative.   HENT: Negative.   Eyes: Negative.   Respiratory: Negative.   Cardiovascular: Positive for leg swelling.  Gastrointestinal: Negative.   Musculoskeletal: Positive for gait problem.  Skin: Negative.   Neurological: Negative.  Speech difficulty  Psychiatric/Behavioral: Negative.   All other systems reviewed and are negative.   BP 170/57  Pulse 62  Ht 6' (1.829 m)  Wt 224 lb (101.606 kg)  BMI 30.37 kg/m2  Physical Exam  Nursing note and vitals reviewed. Constitutional: He is oriented to person, place, and time. He appears well-developed and well-nourished.  Speech impediment from prior stroke  HENT:  Head: Normocephalic.  Nose: Nose normal.  Mouth/Throat: Oropharynx is clear and moist.  Eyes: Conjunctivae are normal. Pupils are equal, round, and reactive to light.  Neck: Normal range of motion. Neck supple. No JVD present. Carotid bruit is present.  Cardiovascular: Normal rate, S1 normal, S2 normal and intact distal pulses.  An irregularly irregular rhythm  present.  Occasional extrasystoles are present. Exam reveals no gallop and no friction rub.   Murmur heard.  Crescendo systolic murmur is present with a grade of 2/6  1-2+ pitting edema is too around the knees bilaterally, Ace wrap in place on the right for leg ulcers  Pulmonary/Chest: Effort normal and breath sounds normal. No respiratory distress. He has no wheezes. He has no rales. He exhibits no tenderness.  Abdominal: Soft. Bowel sounds are normal. He exhibits no distension. There is no tenderness.  Musculoskeletal: Normal range of motion. He exhibits no edema and no tenderness.  Lymphadenopathy:    He has no cervical adenopathy.  Neurological: He is alert and oriented to person, place, and time. Coordination normal.  Skin: Skin is warm and dry. No rash noted. No erythema.  Psychiatric: He has a normal mood and affect. His behavior is normal. Judgment and thought content normal.      Assessment and Plan

## 2012-07-11 NOTE — Assessment & Plan Note (Addendum)
Weight is up 20 pounds from his baseline. Significant lower extremity edema with skin breakdown, ulcerations. We have suggested he take torsemide 40 mg in the morning, 20 mg in the afternoon with potassium daily. Goal weight less than 208 pounds. He reports his home weight is 218 pounds.  Weight is 224 pounds in the office.We have recommended fluid restriction and we will hold his amlodipine as this can contribute to edema.

## 2012-07-11 NOTE — Assessment & Plan Note (Signed)
We will hold his amlodipine to see if this helps improve his edema to significant. We will increase isosorbide to 60 mg twice a day and will continue aggressive diuresis for the next several weeks. He we'll monitor his blood pressure. If this continues to run high despite diuresis him a additional medications will be needed. Continue hydralazine 4 times a day.

## 2012-07-11 NOTE — Assessment & Plan Note (Addendum)
At least mild to moderate aortic valve stenosis. This will contribute to some of his fluid retention.

## 2012-07-12 ENCOUNTER — Telehealth: Payer: Self-pay | Admitting: *Deleted

## 2012-07-12 NOTE — Telephone Encounter (Signed)
Pt's caregiver says meds were changes at yesterday's appt d/t edema, weight gain and HTN Isosorbide was increased to BID Torsemide was increased to 2 tabs in am and 1 tablet after lunch He was to continue hydralazine as prescribed and stop amlodipine [name] reports compliance with this  Reports pt's BP this am was 178/62 at 0830 prior to meds She gave him meds as prescribed and at 1010 he began to feel lightheaded BP at 1010 was 89/41, HR=59 At 1200 BP=109/46, 1230=106/45  She has not given him any hydralazine today She is going to hold the lunch dose of torsemide I explained we are trying to decrease fluid retention and he may require the extra torsemide I agreed with him holding torsemide for today He should continue to monitor fluid intake   I will share this info with Dr. Mariah Milling and will call her back with instructions She states, "thats not necessary". I will just hold the torsemide if BP is low I told her I would still need to talk with Dr. Mariah Milling and call her back

## 2012-07-12 NOTE — Telephone Encounter (Signed)
[name] CARE GIVER CALLED AND STATES BP IS DROPPING 109/46.at 10am 89/41.

## 2012-07-17 NOTE — Telephone Encounter (Signed)
lmtcb on [name]'s VM to call me back Calling to assess pt's symptoms/BP

## 2012-07-18 ENCOUNTER — Telehealth: Payer: Self-pay | Admitting: *Deleted

## 2012-07-18 NOTE — Telephone Encounter (Signed)
[name] called back Says pt's weight is increasing, has gained 5 pounds in 3 days. Home weight went from 218 pounds to 223 pounds this am Says pt has signifiocant amt edema in LE Denies worsening sob Confirms compliance with medication regimen as instructed at last OV (torsemide 2 tabs in am, 1 tablet after lunch) Says BP has been elevated as well (174/60 this am), systolic BP yesterday after wound visit=200 mm hg I told [name] I would have to page Dr. Mariah Milling and call her back Understanding verb

## 2012-07-18 NOTE — Telephone Encounter (Signed)
lmtcb

## 2012-07-18 NOTE — Telephone Encounter (Signed)
Pt caregiver, diane called and states pt bp is 174/60. States pt is retaining fluid. States pt medication has been chnged, but still retaining fluid. States bp yesterday was over 200.

## 2012-07-18 NOTE — Telephone Encounter (Signed)
[name] informed Understanding verb

## 2012-07-18 NOTE — Telephone Encounter (Signed)
"  Have patient increase torsemide to 2 tablets in the morning and 2 tablets after lunch.  Have patient restrict fluid intake and salt consumption. Reassess before end of week and if weight is still climbing, we may have to add metolazone." VO Dr. Alvis Lemmings, RN

## 2012-07-19 ENCOUNTER — Telehealth: Payer: Self-pay | Admitting: Emergency Medicine

## 2012-07-19 ENCOUNTER — Ambulatory Visit (INDEPENDENT_AMBULATORY_CARE_PROVIDER_SITE_OTHER): Payer: Medicare Other

## 2012-07-19 DIAGNOSIS — I4891 Unspecified atrial fibrillation: Secondary | ICD-10-CM

## 2012-07-19 DIAGNOSIS — Z7901 Long term (current) use of anticoagulants: Secondary | ICD-10-CM

## 2012-07-19 DIAGNOSIS — I639 Cerebral infarction, unspecified: Secondary | ICD-10-CM

## 2012-07-19 DIAGNOSIS — I6350 Cerebral infarction due to unspecified occlusion or stenosis of unspecified cerebral artery: Secondary | ICD-10-CM

## 2012-07-19 LAB — POCT INR: INR: 2.5

## 2012-07-19 NOTE — Telephone Encounter (Signed)
Spoke with pt son chris.  Thayer Ohm is aware of appointment

## 2012-07-19 NOTE — Telephone Encounter (Signed)
Pt's caregiver called and stated his BP is up and down x3days. She had contacted Dr. Windell Hummingbird office has adjusted his meds. They would like to see T[place]o preferably this week.

## 2012-07-19 NOTE — Telephone Encounter (Signed)
Fine give him an appt 15 minutes ok

## 2012-07-21 ENCOUNTER — Ambulatory Visit (INDEPENDENT_AMBULATORY_CARE_PROVIDER_SITE_OTHER): Payer: Medicare Other | Admitting: Internal Medicine

## 2012-07-21 ENCOUNTER — Telehealth: Payer: Self-pay

## 2012-07-21 ENCOUNTER — Encounter: Payer: Self-pay | Admitting: Internal Medicine

## 2012-07-21 VITALS — BP 108/58 | HR 65 | Temp 98.4°F | Resp 16 | Wt 222.0 lb

## 2012-07-21 DIAGNOSIS — I701 Atherosclerosis of renal artery: Secondary | ICD-10-CM

## 2012-07-21 DIAGNOSIS — L0390 Cellulitis, unspecified: Secondary | ICD-10-CM

## 2012-07-21 DIAGNOSIS — I5032 Chronic diastolic (congestive) heart failure: Secondary | ICD-10-CM

## 2012-07-21 DIAGNOSIS — N189 Chronic kidney disease, unspecified: Secondary | ICD-10-CM

## 2012-07-21 DIAGNOSIS — Z79899 Other long term (current) drug therapy: Secondary | ICD-10-CM

## 2012-07-21 DIAGNOSIS — L03119 Cellulitis of unspecified part of limb: Secondary | ICD-10-CM

## 2012-07-21 DIAGNOSIS — I10 Essential (primary) hypertension: Secondary | ICD-10-CM

## 2012-07-21 DIAGNOSIS — G4730 Sleep apnea, unspecified: Secondary | ICD-10-CM

## 2012-07-21 DIAGNOSIS — L02419 Cutaneous abscess of limb, unspecified: Secondary | ICD-10-CM

## 2012-07-21 DIAGNOSIS — D509 Iron deficiency anemia, unspecified: Secondary | ICD-10-CM

## 2012-07-21 DIAGNOSIS — R5383 Other fatigue: Secondary | ICD-10-CM

## 2012-07-21 DIAGNOSIS — I509 Heart failure, unspecified: Secondary | ICD-10-CM

## 2012-07-21 DIAGNOSIS — R609 Edema, unspecified: Secondary | ICD-10-CM

## 2012-07-21 DIAGNOSIS — N183 Chronic kidney disease, stage 3 unspecified: Secondary | ICD-10-CM

## 2012-07-21 DIAGNOSIS — N179 Acute kidney failure, unspecified: Secondary | ICD-10-CM

## 2012-07-21 DIAGNOSIS — L0291 Cutaneous abscess, unspecified: Secondary | ICD-10-CM

## 2012-07-21 LAB — CBC WITH DIFFERENTIAL/PLATELET
Basophils Relative: 0.4 % (ref 0.0–3.0)
Eosinophils Relative: 2 % (ref 0.0–5.0)
Lymphocytes Relative: 17.1 % (ref 12.0–46.0)
Neutrophils Relative %: 72.1 % (ref 43.0–77.0)
Platelets: 265 10*3/uL (ref 150.0–400.0)
RBC: 3.84 Mil/uL — ABNORMAL LOW (ref 4.22–5.81)
WBC: 9.4 10*3/uL (ref 4.5–10.5)

## 2012-07-21 LAB — BASIC METABOLIC PANEL
Calcium: 8.8 mg/dL (ref 8.4–10.5)
Creatinine, Ser: 3.2 mg/dL — ABNORMAL HIGH (ref 0.4–1.5)
GFR: 20.12 mL/min — ABNORMAL LOW (ref >60.00)

## 2012-07-21 MED ORDER — ISOSORBIDE MONONITRATE ER 60 MG PO TB24: 60.0000 mg | ORAL_TABLET | Freq: Every day | ORAL | Status: DC

## 2012-07-21 MED ORDER — BUPROPION HCL ER (SR) 150 MG PO TB12: 150.0000 mg | ORAL_TABLET | Freq: Two times a day (BID) | ORAL | Status: AC

## 2012-07-21 MED ORDER — CEPHALEXIN 500 MG PO CAPS: 500.0000 mg | ORAL_CAPSULE | Freq: Four times a day (QID) | ORAL | Status: DC

## 2012-07-21 NOTE — Telephone Encounter (Signed)
Pt went to PCP today See note

## 2012-07-21 NOTE — Progress Notes (Signed)
Patient ID: Scott Norman, male   DOB: 04/24/1931, 79 y.o.   MRN: [id number]  Patient Active Problem List  Diagnosis  . Atrial fibrillation  . Aortic stenosis  . Hyperlipidemia  . Hypertension  . Bradycardia  . Long term (current) use of anticoagulants  . CVA (cerebrovascular accident)  . Type II diabetes mellitus with nephropathy  . Right renal artery stenosis  . Valvular cardiomyopathy  . Screening for osteoporosis  . Iron deficiency anemia  . Dysphagia as late effect of stroke  . Malaise  . Degenerative disc disease, lumbar  . Chronic kidney disease (CKD), stage III (moderate)  . Stroke  . Renal artery stenosis, native, bilateral  . Anxiety  . Sleep apnea  . Right knee DJD  . Pressure ulcer of foot, stage 3  . Chronic diastolic CHF (congestive heart failure)  . Cellulitis and abscess of leg    Subjective:  CC:   Chief Complaint  Patient presents with  . Follow-up    BP    HPI:   Scott Norman a 79 y.o. male who presents  With several issues 1) Labile blood pressure  for the past week or so with frequent episodes of hypotension since his torsemide was increased to 2 tablets bid.  His daughter has been suspending the  hydralazine and the torsemide based on BP readings.    2) Edema/weight gain. He had a 5 lb wt gain overnight from yesterday to today despite taking 2  torsemide in the am and one at lunch   Has reduced imdur to one at bedtime. Denies chest pain, orthopnea. His Ideal weight is < 220 per Dr. Mariah Norman. Review of his home readings indicate hypotension occurring in the late morning and elevations in the early evening.  3) Foot/ankle ulcer on right.  His wound is being managed by [facility] since it occurred as a pressure ulcer afterr his knee replacement .  It is currently under a compression r wrap, which he feels is too tight.    4) moodiness.  Per daughter he has been more emtirritable and angry lately and she is requesting adjustment of his  antidepressant.     Past Medical History  Diagnosis Date  . Diabetes mellitus   . Degenerative disc disease, lumbar     back surgery 2011  . Gout   . Atrial fibrillation   . PVD (peripheral vascular disease)   . Aortic stenosis   . Hyperlipidemia   . Chronic kidney disease (CKD), stage III (moderate)   . H/O colonoscopy June 2007    normal  . Screening for osteoporosis Feb 2012    T -1.3 hip  . Stroke 2012    Rmbloic, with expressive aphasia  . Renal artery stenosis, native, bilateral     s/p right RA stent 2012, pending left   . Heart murmur   . Anxiety   . Sleep apnea     does not use CPAP  . Right knee DJD   . Hypertension     dr Scott Norman   [facility] [place]  . Right knee DJD 02/28/2012    Past Surgical History  Procedure Laterality Date  . Shoulder surgery    . Lumbar spine surgery    . Renal artery stent  04/2011  . Tonsillectomy    . Appendectomy    . Throat surgery  1993    excess tissue removed to help with snoring  . Cardiac catheterization    . Total knee arthroplasty  02/28/2012    Procedure: TOTAL KNEE ARTHROPLASTY;  Surgeon: Scott Simmer, MD;  Location: [facility] OR;  Service: Orthopedics;  Laterality: Right;       The following portions of the patient's history were reviewed and updated as appropriate: Allergies, current medications, and problem list.    Review of Systems:  Patient denies headache, fevers,  unintentional weight loss, skin rash, eye pain, sinus congestion and sinus pain, sore throat, dysphagia,  hemoptysis , cough, dyspnea, wheezing, chest pain, palpitations, orthopnea, edema, abdominal pain, nausea, melena, diarrhea, constipation, flank pain, dysuria, hematuria, urinary  Frequency, nocturia, numbness, tingling, seizures,  Focal weakness, Loss of consciousness,  Tremor, insomnia,and suicidal ideation.      History   Social History  . Marital Status: Legally Separated    Spouse Name: N/A    Number of Children: N/A  . Years of  Education: N/A   Occupational History  . retired    Social History Main Topics  . Smoking status: Never Smoker   . Smokeless tobacco: Never Used  . Alcohol Use: 1.2 oz/week    2 Glasses of wine per week     Comment: rarely  . Drug Use: No  . Sexually Active: No   Other Topics Concern  . Not on file   Social History Narrative   Lives with second wife,  often left alone for hours on end.  ,  Meals delayed by wife's absence from hone until 7 or 8 PM.  Daughter Scott Norman actively involved , Ambulates with walker.     Objective:  BP 108/58  Pulse 65  Temp(Src) 98.4 F (36.9 C) (Oral)  Resp 16  Wt 222 lb (100.699 kg)  BMI 30.1 kg/m2  SpO2 97%  General appearance: alert, cooperative and appears stated age Ears: normal TM's and external ear canals both ears Throat: lips, mucosa, and tongue normal; teeth and gums normal Neck: no adenopathy, no carotid bruit, supple, symmetrical, trachea midline and thyroid not enlarged, symmetric, no tenderness/mass/nodules Back: symmetric, no curvature. ROM normal. No CVA tenderness. Lungs: clear to auscultation bilaterally Heart: regular rate and rhythm, S1, S2 normal, no murmur, click, rub or gallop Abdomen: soft, non-tender; bowel sounds normal; no masses,  no organomegaly Pulses: 2+ and symmetric Skin: the skin above his compressive wrap on right lef is warm and red.  Cervical, supraclavicular, and axillary nodes normal.  Assessment and Plan:  Hypertension Labile, compliated by renal artery stenosis and severe aortic stenosis and mitral regurgitation.  Changing Imdur to once daily at 5 pm given low morning bps.  Torsemide was initially continued at twice daily but held after renal function was dtermined today to be severely diminised to GFR of 20.  Continue hydralazine tid but hold for systolics < 150.   Malaise Multifactorial, secondary to depression, anemia and deconditioning from chronic pain secondary to DJD knee.  continue PT,   Increasing effexor dose to 150 mg today   Chronic kidney disease (CKD), stage III (moderate) Acute decline in GFR to 20 ml/min presumed secondary to increased dose of torsemide for fluid retention. Torsemide dose was held starting Saturday aftenroon and repeat BMET scheduled for Monday.  If no improcement may need urgent renal ultrasound and nephrology follow up,.   Cellulitis and abscess of leg Suspected given appearance of leg.,  Unfortunately he cancelled his wound care appt which was for today in order to come here for his hypertension issues and put in a 15 minute slot and there was no time or staff  available to unwrap the right  leg. Empiric cephalexin prescribed and caregiver will unwrap the leg this afternoon. Patient's daughter advised ot reschedule wound care visit.   Iron deficiency anemia Complicated by surgical losses and CKD.  Repeat hgb today is lately falsely elevated due to volume contraction.   Renal artery stenosis, native, bilateral There has been no major improvement to his labile hypertension since Stenting of the Right RA.    Sleep apnea He has not tolerated CPAP .  Untreated OSA is likely aggravating his labile hypertension and heart failure   Chronic diastolic CHF (congestive heart failure) Multifactorial, secondary to pulmonary hypertension (presumed, from untreated OSA), aortic stenosis and atrial fibrillation. Torsemide bid dosing has been stopped despite fluid retention due to decline in GFR for 20 from 30 ml/min 3 months ago  A total of 40 minutes was spent with patient more than half of which was spent in counseling, reviewing records from other prviders and coordination of care.   Updated Medication List Outpatient Encounter Prescriptions as of 07/21/2012  Medication Sig Dispense Refill  . buPROPion (WELLBUTRIN SR) 150 MG 12 hr tablet Take 1 tablet (150 mg total) by mouth 2 (two) times daily.  60 tablet  3  . cholecalciferol (VITAMIN D) 1000 UNITS tablet Take  2,000 Units by mouth daily.      . diazepam (VALIUM) 5 MG tablet Take 5 mg by mouth daily.      Marland Kitchen doxazosin (CARDURA) 4 MG tablet Take 1 tablet (4 mg total) by mouth at bedtime.  30 tablet  3  . fenofibrate (TRICOR) 145 MG tablet Take 1 tablet (145 mg total) by mouth daily.  30 tablet  6  . glucose blood (BAYER CONTOUR NEXT TEST) test strip Use as instructed  100 each  12  . hydrALAZINE (APRESOLINE) 100 MG tablet Take 1.5 tablets (150 mg total) by mouth 4 (four) times daily.  180 tablet  6  . isosorbide mononitrate (IMDUR) 60 MG 24 hr tablet Take 1 tablet (60 mg total) by mouth daily after supper. Take 1 tablet at bed time.  If hypertension in am then take one as needed in am  60 tablet  6  . nitroGLYCERIN (NITROSTAT) 0.4 MG SL tablet Place 0.4 mg under the tongue every 5 (five) minutes as needed. For chest pain      . potassium chloride (K-DUR) 10 MEQ tablet Take 1 tablet (10 mEq total) by mouth daily.  30 tablet  6  . torsemide (DEMADEX) 20 MG tablet Take 2 tablets (40 mg total) by mouth 2 (two) times daily.  120 tablet  6  . warfarin (COUMADIN) 6 MG tablet 1 tablet everyday except 1/2 tablet on Wednesday. Take as directed by anticoagulation clinic      . [DISCONTINUED] buPROPion (WELLBUTRIN SR) 100 MG 12 hr tablet Take 1 tablet (100 mg total) by mouth 2 (two) times daily.  60 tablet  3  . [DISCONTINUED] isosorbide mononitrate (IMDUR) 60 MG 24 hr tablet Take 1 tablet (60 mg total) by mouth 2 (two) times daily. Take 1 tablet at bed time.  If hypertension in am then take one as needed in am  60 tablet  6  . [DISCONTINUED] warfarin (COUMADIN) 6 MG tablet Take as directed by anticoagulation clinic  35 tablet  2  . cephALEXin (KEFLEX) 500 MG capsule Take 1 capsule (500 mg total) by mouth 4 (four) times daily.  28 capsule  0  . HYDROcodone-acetaminophen (NORCO) 10-325 MG per tablet Take 1  tablet by mouth as needed.      Marland Kitchen ketoconazole (NIZORAL) 2 % cream Apply 1 application topically as needed.      .  polyethylene glycol (MIRALAX / GLYCOLAX) packet Take 17 g by mouth as needed.       . triamcinolone cream (KENALOG) 0.1 % Apply 1 application topically as needed.      . [DISCONTINUED] amLODipine (NORVASC) 5 MG tablet Take 1 tablet (5 mg total) by mouth daily.  90 tablet  6   No facility-administered encounter medications on file as of 07/21/2012.     Orders Placed This Encounter  Procedures  . Basic metabolic panel  . CBC with Differential  . Basic metabolic panel  . B Nat Peptide  . Microalbumin / creatinine urine ratio    No Follow-up on file.

## 2012-07-21 NOTE — Patient Instructions (Addendum)
Change the imdur to once daily at 5 pm.  Continue the torsemide twice daily  Hold the hydralazine for BP < 120   Have [name] unwrap the leg today and if the wound looks infected call me to decide whether to re wrap it,  I am prescribing keflex , an antibiotic to cover any skin infection that may be causing your leg to be so red.   Labs today to check potassium level  Increasing the bupropion to 150 mg twice daily to improve your mood

## 2012-07-21 NOTE — Telephone Encounter (Signed)
Assess BP and weights

## 2012-07-22 ENCOUNTER — Encounter: Payer: Self-pay | Admitting: Internal Medicine

## 2012-07-23 ENCOUNTER — Encounter: Payer: Self-pay | Admitting: Internal Medicine

## 2012-07-23 DIAGNOSIS — L02419 Cutaneous abscess of limb, unspecified: Secondary | ICD-10-CM | POA: Insufficient documentation

## 2012-07-23 NOTE — Assessment & Plan Note (Signed)
Suspected given appearance of leg.,  Unfortunately he cancelled his wound care appt which was for today in order to come here for his hypertension issues and put in a 15 minute slot and there was no time or staff available to unwrap the right  leg. Empiric cephalexin prescribed and caregiver will unwrap the leg this afternoon. Patient's daughter advised ot reschedule wound care visit.

## 2012-07-23 NOTE — Assessment & Plan Note (Signed)
Complicated by surgical losses and CKD.  Repeat hgb today is lately falsely elevated due to volume contraction.

## 2012-07-23 NOTE — Assessment & Plan Note (Signed)
Acute decline in GFR to 20 ml/min presumed secondary to increased dose of torsemide for fluid retention. Torsemide dose was held starting Saturday aftenroon and repeat BMET scheduled for Monday.  If no improcement may need urgent renal ultrasound and nephrology follow up,.

## 2012-07-23 NOTE — Assessment & Plan Note (Signed)
There has been no major improvement to his labile hypertension since Stenting of the Right RA.

## 2012-07-23 NOTE — Assessment & Plan Note (Signed)
Labile, compliated by renal artery stenosis and severe aortic stenosis and mitral regurgitation.  Changing Imdur to once daily at 5 pm given low morning bps.  Torsemide was initially continued at twice daily but held after renal function was dtermined today to be severely diminised to GFR of 20.  Continue hydralazine tid but hold for systolics < 150.

## 2012-07-23 NOTE — Assessment & Plan Note (Signed)
Multifactorial, secondary to depression, anemia and deconditioning from chronic pain secondary to DJD knee.  continue PT,  Increasing effexor dose to 150 mg today

## 2012-07-23 NOTE — Assessment & Plan Note (Signed)
He has not tolerated CPAP .  Untreated OSA is likely aggravating his labile hypertension and heart failure

## 2012-07-23 NOTE — Assessment & Plan Note (Signed)
Multifactorial, secondary to pulmonary hypertension (presumed, from untreated OSA), aortic stenosis and atrial fibrillation. Torsemide bid dosing has been stopped despite fluid retention due to decline in GFR for 20 from 30 ml/min 3 months ago

## 2012-07-24 ENCOUNTER — Encounter: Payer: Self-pay | Admitting: Internal Medicine

## 2012-07-24 ENCOUNTER — Other Ambulatory Visit (INDEPENDENT_AMBULATORY_CARE_PROVIDER_SITE_OTHER): Payer: Medicare Other

## 2012-07-24 DIAGNOSIS — N189 Chronic kidney disease, unspecified: Secondary | ICD-10-CM

## 2012-07-24 DIAGNOSIS — Z79899 Other long term (current) drug therapy: Secondary | ICD-10-CM

## 2012-07-24 DIAGNOSIS — Z8701 Personal history of pneumonia (recurrent): Secondary | ICD-10-CM

## 2012-07-24 DIAGNOSIS — R609 Edema, unspecified: Secondary | ICD-10-CM

## 2012-07-24 DIAGNOSIS — N179 Acute kidney failure, unspecified: Secondary | ICD-10-CM

## 2012-07-24 LAB — BRAIN NATRIURETIC PEPTIDE: Pro B Natriuretic peptide (BNP): 516 pg/mL — ABNORMAL HIGH (ref 0.0–100.0)

## 2012-07-24 LAB — MICROALBUMIN / CREATININE URINE RATIO
Creatinine,U: 31.8 mg/dL
Microalb Creat Ratio: 99.2 mg/g — ABNORMAL HIGH (ref 0.0–30.0)
Microalb, Ur: 31.6 mg/dL — ABNORMAL HIGH (ref 0.0–1.9)

## 2012-07-24 LAB — CBC WITH DIFFERENTIAL/PLATELET
Basophils Relative: 0.3 % (ref 0.0–3.0)
Eosinophils Absolute: 0.2 10*3/uL (ref 0.0–0.7)
Eosinophils Relative: 1.9 % (ref 0.0–5.0)
Hemoglobin: 11 g/dL — ABNORMAL LOW (ref 13.0–17.0)
Lymphocytes Relative: 17.6 % (ref 12.0–46.0)
MCHC: 33.1 g/dL (ref 30.0–36.0)
MCV: 87 fl (ref 78.0–100.0)
Monocytes Absolute: 0.6 10*3/uL (ref 0.1–1.0)
Neutro Abs: 5.8 10*3/uL (ref 1.4–7.7)
RBC: 3.83 Mil/uL — ABNORMAL LOW (ref 4.22–5.81)
WBC: 7.9 10*3/uL (ref 4.5–10.5)

## 2012-07-25 ENCOUNTER — Other Ambulatory Visit: Payer: Self-pay | Admitting: Internal Medicine

## 2012-07-25 ENCOUNTER — Encounter: Payer: Self-pay | Admitting: Internal Medicine

## 2012-07-25 DIAGNOSIS — N179 Acute kidney failure, unspecified: Secondary | ICD-10-CM

## 2012-07-25 LAB — BASIC METABOLIC PANEL
BUN: 62 mg/dL — ABNORMAL HIGH (ref 6–23)
Chloride: 105 mEq/L (ref 96–112)
GFR: 21.94 mL/min — ABNORMAL LOW (ref >60.00)
Potassium: 4.5 mEq/L (ref 3.5–5.1)
Sodium: 140 mEq/L (ref 135–145)

## 2012-07-25 LAB — COMPREHENSIVE METABOLIC PANEL
AST: 21 U/L (ref 0–37)
Albumin: 3.2 g/dL — ABNORMAL LOW (ref 3.5–5.2)
BUN: 62 mg/dL — ABNORMAL HIGH (ref 6–23)
Calcium: 8.6 mg/dL (ref 8.4–10.5)
Chloride: 105 mEq/L (ref 96–112)
Creatinine, Ser: 3 mg/dL — ABNORMAL HIGH (ref 0.4–1.5)
Glucose, Bld: 116 mg/dL — ABNORMAL HIGH (ref 70–99)
Potassium: 4.5 mEq/L (ref 3.5–5.1)

## 2012-07-26 ENCOUNTER — Other Ambulatory Visit: Payer: Self-pay | Admitting: Internal Medicine

## 2012-07-26 DIAGNOSIS — N179 Acute kidney failure, unspecified: Secondary | ICD-10-CM

## 2012-07-28 ENCOUNTER — Ambulatory Visit: Admit: 2012-07-28 | Payer: Self-pay | Admitting: Orthopedic Surgery

## 2012-07-28 ENCOUNTER — Encounter: Payer: Self-pay | Admitting: Cardiovascular Disease

## 2012-07-28 ENCOUNTER — Ambulatory Visit (INDEPENDENT_AMBULATORY_CARE_PROVIDER_SITE_OTHER): Payer: Medicare Other | Admitting: Cardiovascular Disease

## 2012-07-28 ENCOUNTER — Telehealth: Payer: Self-pay | Admitting: Internal Medicine

## 2012-07-28 ENCOUNTER — Encounter (HOSPITAL_COMMUNITY): Payer: Self-pay

## 2012-07-28 VITALS — BP 158/58 | HR 68 | Ht 72.0 in | Wt 224.0 lb

## 2012-07-28 DIAGNOSIS — I350 Nonrheumatic aortic (valve) stenosis: Secondary | ICD-10-CM

## 2012-07-28 DIAGNOSIS — I701 Atherosclerosis of renal artery: Secondary | ICD-10-CM

## 2012-07-28 DIAGNOSIS — L8993 Pressure ulcer of unspecified site, stage 3: Secondary | ICD-10-CM

## 2012-07-28 DIAGNOSIS — N183 Chronic kidney disease, stage 3 unspecified: Secondary | ICD-10-CM

## 2012-07-28 DIAGNOSIS — L89893 Pressure ulcer of other site, stage 3: Secondary | ICD-10-CM

## 2012-07-28 DIAGNOSIS — I4891 Unspecified atrial fibrillation: Secondary | ICD-10-CM

## 2012-07-28 DIAGNOSIS — R0602 Shortness of breath: Secondary | ICD-10-CM

## 2012-07-28 DIAGNOSIS — L89899 Pressure ulcer of other site, unspecified stage: Secondary | ICD-10-CM

## 2012-07-28 DIAGNOSIS — I359 Nonrheumatic aortic valve disorder, unspecified: Secondary | ICD-10-CM

## 2012-07-28 DIAGNOSIS — I10 Essential (primary) hypertension: Secondary | ICD-10-CM

## 2012-07-28 SURGERY — AMPUTATION, FOOT, RAY
Anesthesia: General | Laterality: Right

## 2012-07-28 MED ORDER — NYSTATIN 100000 UNIT/GM EX POWD: Freq: Four times a day (QID) | CUTANEOUS | Status: AC

## 2012-07-28 NOTE — Assessment & Plan Note (Signed)
Currently doing well on warfarin

## 2012-07-28 NOTE — Assessment & Plan Note (Signed)
Suspected overdiuresis on torsemide with increasing creatinine and BUN above his baseline. The hope is that this will improve with less diuretic over the next several weeks. We have asked him to increase his fluid intake mildly. If repeat creatinine continues to be elevated on torsemide every other day, we could decrease the dose to one half torsemide every other day ( 10 mg).

## 2012-07-28 NOTE — Assessment & Plan Note (Signed)
Mild to moderate, possibly moderate aortic valve stenosis. No action needed at this time

## 2012-07-28 NOTE — Assessment & Plan Note (Signed)
Family friend helps with blood pressure and has extra hydralazine, extra isosorbide ( either full pill or one half pill), and even could double up on Cardura if needed. Anxiety is a component of his blood pressure is well and he checks his blood pressure numerous times per day. Previously, blood pressure was well-controlled. Uncertain why it is higher now. We'll recheck renal artery stent with ultrasound

## 2012-07-28 NOTE — Telephone Encounter (Signed)
Left message on daughter Nathanial Rancher phone taht the options for osteomyelitis are amputation or possibly long term antibiotics, but I advised amputation.

## 2012-07-28 NOTE — Assessment & Plan Note (Signed)
Acceptable risk for upcoming foot surgery next week. No further testing needed will likely need local block, possibly epidural

## 2012-07-28 NOTE — Patient Instructions (Addendum)
Please continue the extra hydralazine and isosorbide (1/2 or whole dose) as needed for high blood pressure  We will order renal ultrasound to look at the renal artery stent  Please call us if you have new issues that need to be addressed before your next appt.  Your physician wants you to follow-up in: 2 months.

## 2012-07-28 NOTE — Telephone Encounter (Signed)
Pt daughter called stating that pt seen Dr. Cherylann Ratel on Wednesday additional labs run kidneys functioning at 20%. Went to [facility] [address] to see Dr. Lajoyce Corners at [facility] his foot is infected has eaten through one of the bones in his little toe. Wanted pt to be hospitalized to get this taken care of. Pt declined.    Pt daughter would like a call back to discuss alternative options and where to turn to next.

## 2012-07-28 NOTE — Assessment & Plan Note (Signed)
We'll repeat renal artery  Ultrasound to evaluate patency of stent given labile blood pressures.

## 2012-07-28 NOTE — Progress Notes (Signed)
Patient ID: Scott Norman, male    DOB: 30-Aug-1930, 79 y.o.   MRN: [id number]  HPI Comments: 79 yo with history of paroxysmal atrial fibrillation, CKD,  mild to moderate aortic stenosis,  hospitalization for large cardioembolic stroke ([place] in 5/12 with a left MCA cardioembolic stroke),   treated with tPA, noted to be in atrial fibrillation  while in the hospital with residual profound expressive aphasia, now on coumadin,  with weight gain,  edema and abdominal swelling in 2012 , Severe labile hypertension, found to have renal artery stenosis,  status post stent placement. He presents for routine followup.  On his last clinic visit, he had worsening edema, ulcers on his right lower extremity requiring a wrap.  He was started on high-dose torsemide with significant weight loss, improvement of his edema. Amlodipine was also held on the thought that the calcium channel blocker was causing worsening lower extremity edema. His weight did decrease and he felt better with better blood pressure but renal function studies showed any up to 3. Diuretic has been decreased to every other day.   Blood pressure recently has been very high. His family friend who typically comes with him reports that it has been labile but generally she treats it as is labile., low in the morning and high the rest of the day. There is a anxiety component for his labile blood pressures.  Blood pressure today at home was in the 200 range this afternoon. They have been taking less hydralazine, sometimes 50, sometimes 100 mg at a time. Isosorbide was cut back to evening only as blood pressure was low in the morning  He is scheduled to have foot surgery for what sounds like osteomyelitis of the right foot  EKG shows normal sinus rhythm with rate 68 beats per minute with right bundle branch block, nonspecific ST abnormality    Outpatient Encounter Prescriptions as of 07/28/2012  Medication Sig Dispense Refill  . buPROPion  (WELLBUTRIN SR) 150 MG 12 hr tablet Take 1 tablet (150 mg total) by mouth 2 (two) times daily.  60 tablet  3  . cephALEXin (KEFLEX) 500 MG capsule Take 1 capsule (500 mg total) by mouth 4 (four) times daily.  28 capsule  0  . cholecalciferol (VITAMIN D) 1000 UNITS tablet Take 2,000 Units by mouth daily.      . diazepam (VALIUM) 5 MG tablet Take 5 mg by mouth daily.      Marland Kitchen doxazosin (CARDURA) 4 MG tablet Take 1 tablet (4 mg total) by mouth at bedtime.  30 tablet  3  . fenofibrate (TRICOR) 145 MG tablet Take 1 tablet (145 mg total) by mouth daily.  30 tablet  6  . glucose blood (BAYER CONTOUR NEXT TEST) test strip Use as instructed  100 each  12  . hydrALAZINE (APRESOLINE) 100 MG tablet Take 1.5 tablets (150 mg total) by mouth 4 (four) times daily.  180 tablet  6  . HYDROcodone-acetaminophen (NORCO) 10-325 MG per tablet Take 1 tablet by mouth as needed.      . isosorbide mononitrate (IMDUR) 60 MG 24 hr tablet Take 1 tablet (60 mg total) by mouth daily after supper. Take 1 tablet at bed time.  If hypertension in am then take one as needed in am  60 tablet  6  . ketoconazole (NIZORAL) 2 % cream Apply 1 application topically as needed.      . nitroGLYCERIN (NITROSTAT) 0.4 MG SL tablet Place 0.4 mg under the tongue every  5 (five) minutes as needed. For chest pain      . polyethylene glycol (MIRALAX / GLYCOLAX) packet Take 17 g by mouth as needed.       . triamcinolone cream (KENALOG) 0.1 % Apply 1 application topically as needed.      . warfarin (COUMADIN) 6 MG tablet 1 tablet everyday except 1/2 tablet on Wednesday. Take as directed by anticoagulation clinic      . nystatin (MYCOSTATIN) powder Apply topically 4 (four) times daily.  15 g  0    Review of Systems  Constitutional: Negative.   HENT: Negative.   Eyes: Negative.   Respiratory: Negative.   Cardiovascular: Positive for leg swelling.  Gastrointestinal: Negative.   Musculoskeletal: Positive for gait problem.  Skin: Negative.    Neurological: Negative.        Speech difficulty  Psychiatric/Behavioral: Negative.   All other systems reviewed and are negative.   BP 158/58  Pulse 68  Ht 6' (1.829 m)  Wt 224 lb (101.606 kg)  BMI 30.37 kg/m2  Physical Exam  Nursing note and vitals reviewed. Constitutional: He is oriented to person, place, and time. He appears well-developed and well-nourished.  Speech impediment from prior stroke  HENT:  Head: Normocephalic.  Nose: Nose normal.  Mouth/Throat: Oropharynx is clear and moist.  Eyes: Conjunctivae are normal. Pupils are equal, round, and reactive to light.  Neck: Normal range of motion. Neck supple. No JVD present. Carotid bruit is present.  Cardiovascular: Normal rate, S1 normal, S2 normal and intact distal pulses.  An irregularly irregular rhythm present.  Occasional extrasystoles are present. Exam reveals no gallop and no friction rub.   Murmur heard.  Crescendo systolic murmur is present with a grade of 2/6  trace pitting edema   Ace wrap in place on the right  Pulmonary/Chest: Effort normal and breath sounds normal. No respiratory distress. He has no wheezes. He has no rales. He exhibits no tenderness.  Abdominal: Soft. Bowel sounds are normal. He exhibits no distension. There is no tenderness.  Musculoskeletal: Normal range of motion. He exhibits no edema and no tenderness.  Lymphadenopathy:    He has no cervical adenopathy.  Neurological: He is alert and oriented to person, place, and time. Coordination normal.  Skin: Skin is warm and dry. No rash noted. No erythema.  Psychiatric: He has a normal mood and affect. His behavior is normal. Judgment and thought content normal.      Assessment and Plan

## 2012-08-01 ENCOUNTER — Encounter (INDEPENDENT_AMBULATORY_CARE_PROVIDER_SITE_OTHER): Payer: Medicare Other

## 2012-08-01 ENCOUNTER — Encounter (HOSPITAL_COMMUNITY): Payer: Self-pay | Admitting: *Deleted

## 2012-08-01 ENCOUNTER — Inpatient Hospital Stay (HOSPITAL_COMMUNITY): Admission: RE | Admit: 2012-08-01 | Payer: Medicare Other | Source: Ambulatory Visit

## 2012-08-01 ENCOUNTER — Telehealth: Payer: Self-pay | Admitting: Internal Medicine

## 2012-08-01 DIAGNOSIS — I701 Atherosclerosis of renal artery: Secondary | ICD-10-CM

## 2012-08-01 MED ORDER — ALPRAZOLAM 0.5 MG PO TABS: 0.5000 mg | ORAL_TABLET | Freq: Three times a day (TID) | ORAL | Status: DC | PRN

## 2012-08-01 NOTE — Telephone Encounter (Signed)
Patient Information:  Caller Name: [name]  Phone: [phone]  Patient: Scott Norman, Scott Norman  Gender: Male  DOB: [birth year]  Age: 79 Years  PCP: Duncan Dull (Adults only)  Office Follow Up:  Does the office need to follow up with this patient?: No  Instructions For The Office: N/A  RN Note:  caregiver will call back with any concerns and questions  Symptoms  Reason For Call & Symptoms: Caregiver calling concerned about BP.  12:00 196/78  HR 63.  Caregiver has given pt NTG x 4 as directed by Cardilogist.  She is calling because BP is still high after regular meds.  11:22 213/84  HR 65  Caregiver is worried about CVA  Care giver has added another dose of BP meds as well.  Caregiver is believes that he is having anxiety.  He is due to have surgery.  Reviewed Health History In EMR: Yes  Reviewed Medications In EMR: Yes  Reviewed Allergies In EMR: Yes  Reviewed Surgeries / Procedures: Yes  Date of Onset of Symptoms: 07/31/2012  Guideline(s) Used:  High Blood Pressure  Disposition Per Guideline:   See Within 2 Weeks in Office  Reason For Disposition Reached:   BP > 140/90 and is taking BP medications  Advice Given:  Call Back If:  You want to go in to the office for a blood pressure check  You become worse.

## 2012-08-01 NOTE — Progress Notes (Signed)
Pts care taker was instructed by Dr Lajoyce Corners to continue Coumadin prior to surgery.

## 2012-08-01 NOTE — Telephone Encounter (Signed)
I agree, it may be his anxiety about having his toe amputated that is driving his bp up. f he has not already taken a total of 10 mg of amlodipine today,  I would take that first ,  Starting with a 5 mg dose,  Which will bring it down.  Needs to wait 4 hours before taking the 2nd dose  . I will also authorize use of alprazolam 0.5 mg every 8 hours as needed to try to help calm his nerves,  It is similar to diazepam but shorter acting .  please call in and notify  dtr or caregiver. Marland Kitchen

## 2012-08-01 NOTE — Telephone Encounter (Signed)
Spoke with Dr. Darrick Huntsman aware of last BP reading..Plan to continue to check  BP [name] is aware and okay with the plan.

## 2012-08-01 NOTE — Telephone Encounter (Signed)
Spoke with [name] (caregiver).,aware that we are calling in Alprazolam 0.5mg  to calm is nerves (script called to [facility]. [name] relates that Dr. Mariah Milling dc'd the Amlodipine and that at 2pm pt BP 168/78, P-78. Please advise.

## 2012-08-02 ENCOUNTER — Encounter (HOSPITAL_COMMUNITY)
Admission: RE | Disposition: A | Discharge status: Skilled Nursing Facility | Payer: Self-pay | Source: Ambulatory Visit | Attending: Orthopedic Surgery

## 2012-08-02 ENCOUNTER — Ambulatory Visit (HOSPITAL_COMMUNITY): Payer: Medicare Other | Admitting: Anesthesiology

## 2012-08-02 ENCOUNTER — Ambulatory Visit (HOSPITAL_COMMUNITY)
Admission: RE | Admit: 2012-08-02 | Discharge: 2012-08-07 | Disposition: A | Discharge status: Skilled Nursing Facility | Payer: Medicare Other | Source: Ambulatory Visit | Attending: Orthopedic Surgery | Admitting: Orthopedic Surgery

## 2012-08-02 ENCOUNTER — Encounter (HOSPITAL_COMMUNITY): Payer: Self-pay | Admitting: Anesthesiology

## 2012-08-02 ENCOUNTER — Encounter (HOSPITAL_COMMUNITY): Payer: Self-pay | Admitting: *Deleted

## 2012-08-02 DIAGNOSIS — I509 Heart failure, unspecified: Secondary | ICD-10-CM | POA: Insufficient documentation

## 2012-08-02 DIAGNOSIS — L97509 Non-pressure chronic ulcer of other part of unspecified foot with unspecified severity: Secondary | ICD-10-CM | POA: Insufficient documentation

## 2012-08-02 DIAGNOSIS — M869 Osteomyelitis, unspecified: Secondary | ICD-10-CM

## 2012-08-02 DIAGNOSIS — M86679 Other chronic osteomyelitis, unspecified ankle and foot: Secondary | ICD-10-CM | POA: Insufficient documentation

## 2012-08-02 DIAGNOSIS — N183 Chronic kidney disease, stage 3 unspecified: Secondary | ICD-10-CM | POA: Insufficient documentation

## 2012-08-02 DIAGNOSIS — I129 Hypertensive chronic kidney disease with stage 1 through stage 4 chronic kidney disease, or unspecified chronic kidney disease: Secondary | ICD-10-CM | POA: Insufficient documentation

## 2012-08-02 DIAGNOSIS — M9080 Osteopathy in diseases classified elsewhere, unspecified site: Secondary | ICD-10-CM | POA: Insufficient documentation

## 2012-08-02 DIAGNOSIS — I739 Peripheral vascular disease, unspecified: Secondary | ICD-10-CM | POA: Insufficient documentation

## 2012-08-02 DIAGNOSIS — E1169 Type 2 diabetes mellitus with other specified complication: Secondary | ICD-10-CM | POA: Insufficient documentation

## 2012-08-02 DIAGNOSIS — L98499 Non-pressure chronic ulcer of skin of other sites with unspecified severity: Secondary | ICD-10-CM | POA: Insufficient documentation

## 2012-08-02 HISTORY — PX: TOE AMPUTATION: SHX809

## 2012-08-02 HISTORY — DX: Anemia, unspecified: D64.9

## 2012-08-02 HISTORY — DX: Pneumonia, unspecified organism: J18.9

## 2012-08-02 HISTORY — PX: AMPUTATION: SHX166

## 2012-08-02 HISTORY — DX: Shortness of breath: R06.02

## 2012-08-02 LAB — GLUCOSE, CAPILLARY: Glucose-Capillary: 93 mg/dL (ref 70–99)

## 2012-08-02 LAB — PROTIME-INR: INR: 2.24 — ABNORMAL HIGH (ref 0.00–1.49)

## 2012-08-02 SURGERY — AMPUTATION, FOOT, RAY
Anesthesia: General | Site: Foot | Laterality: Right | Wound class: Dirty or Infected

## 2012-08-02 MED ORDER — POLYETHYLENE GLYCOL 3350 17 G PO PACK
17.0000 g | PACK | Freq: Every day | ORAL | Status: DC | PRN
  Administered 2012-08-05 – 2012-08-06 (×2): 17 g via ORAL
  Filled 2012-08-02: qty 1

## 2012-08-02 MED ORDER — OXYCODONE-ACETAMINOPHEN 5-325 MG PO TABS
ORAL_TABLET | ORAL | Status: AC
  Filled 2012-08-02: qty 2

## 2012-08-02 MED ORDER — WARFARIN SODIUM 3 MG PO TABS
3.0000 mg | ORAL_TABLET | ORAL | Status: DC
  Administered 2012-08-02: 3 mg via ORAL
  Filled 2012-08-02: qty 1

## 2012-08-02 MED ORDER — WARFARIN SODIUM 3 MG PO TABS
3.0000 mg | ORAL_TABLET | ORAL | Status: DC
  Filled 2012-08-02: qty 1

## 2012-08-02 MED ORDER — ONDANSETRON HCL 4 MG/2ML IJ SOLN: 4.0000 mg | Freq: Four times a day (QID) | INTRAMUSCULAR | Status: DC | PRN

## 2012-08-02 MED ORDER — 0.9 % SODIUM CHLORIDE (POUR BTL) OPTIME
TOPICAL | Status: DC | PRN
  Administered 2012-08-02: 1000 mL

## 2012-08-02 MED ORDER — ALPRAZOLAM 0.5 MG PO TABS: 0.5000 mg | ORAL_TABLET | Freq: Three times a day (TID) | ORAL | Status: DC | PRN

## 2012-08-02 MED ORDER — CEFAZOLIN SODIUM-DEXTROSE 2-3 GM-% IV SOLR
INTRAVENOUS | Status: AC
  Filled 2012-08-02: qty 50

## 2012-08-02 MED ORDER — CEFAZOLIN SODIUM-DEXTROSE 2-3 GM-% IV SOLR
2.0000 g | Freq: Once | INTRAVENOUS | Status: AC
  Administered 2012-08-02: 2 g via INTRAVENOUS

## 2012-08-02 MED ORDER — FENTANYL CITRATE 0.05 MG/ML IJ SOLN
INTRAMUSCULAR | Status: DC | PRN
  Administered 2012-08-02: 50 ug via INTRAVENOUS

## 2012-08-02 MED ORDER — KETOCONAZOLE 2 % EX CREA
1.0000 "application " | TOPICAL_CREAM | Freq: Every day | CUTANEOUS | Status: DC | PRN
  Filled 2012-08-02: qty 15

## 2012-08-02 MED ORDER — WARFARIN SODIUM 6 MG PO TABS
6.0000 mg | ORAL_TABLET | ORAL | Status: DC
  Administered 2012-08-03: 6 mg via ORAL
  Filled 2012-08-02 (×2): qty 1

## 2012-08-02 MED ORDER — ISOSORBIDE MONONITRATE ER 60 MG PO TB24
60.0000 mg | ORAL_TABLET | Freq: Every day | ORAL | Status: DC | PRN
  Filled 2012-08-02: qty 1

## 2012-08-02 MED ORDER — LIDOCAINE HCL (CARDIAC) 20 MG/ML IV SOLN
INTRAVENOUS | Status: DC | PRN
  Administered 2012-08-02: 100 mg via INTRAVENOUS

## 2012-08-02 MED ORDER — DOXAZOSIN MESYLATE 4 MG PO TABS
4.0000 mg | ORAL_TABLET | Freq: Every day | ORAL | Status: DC
  Administered 2012-08-02 – 2012-08-06 (×5): 4 mg via ORAL
  Filled 2012-08-02 (×7): qty 1

## 2012-08-02 MED ORDER — NYSTATIN 100000 UNIT/GM EX POWD
Freq: Four times a day (QID) | CUTANEOUS | Status: DC
  Filled 2012-08-02 (×30): qty 30

## 2012-08-02 MED ORDER — CEFAZOLIN SODIUM 1-5 GM-% IV SOLN
1.0000 g | Freq: Four times a day (QID) | INTRAVENOUS | Status: AC
  Administered 2012-08-02 – 2012-08-03 (×3): 1 g via INTRAVENOUS
  Filled 2012-08-02 (×3): qty 50

## 2012-08-02 MED ORDER — ISOSORBIDE MONONITRATE ER 60 MG PO TB24
60.0000 mg | ORAL_TABLET | Freq: Every day | ORAL | Status: DC
  Administered 2012-08-02 – 2012-08-06 (×5): 60 mg via ORAL
  Filled 2012-08-02 (×6): qty 1

## 2012-08-02 MED ORDER — DIAZEPAM 5 MG PO TABS
ORAL_TABLET | ORAL | Status: AC
  Filled 2012-08-02: qty 1

## 2012-08-02 MED ORDER — PROMETHAZINE HCL 25 MG/ML IJ SOLN: 6.2500 mg | INTRAMUSCULAR | Status: DC | PRN

## 2012-08-02 MED ORDER — PROPOFOL 10 MG/ML IV BOLUS
INTRAVENOUS | Status: DC | PRN
  Administered 2012-08-02: 120 mg via INTRAVENOUS

## 2012-08-02 MED ORDER — HYDROMORPHONE HCL PF 1 MG/ML IJ SOLN
0.2500 mg | INTRAMUSCULAR | Status: DC | PRN
  Administered 2012-08-02 (×3): 0.25 mg via INTRAVENOUS

## 2012-08-02 MED ORDER — BUPROPION HCL ER (SR) 150 MG PO TB12
150.0000 mg | ORAL_TABLET | Freq: Two times a day (BID) | ORAL | Status: DC
  Administered 2012-08-02 – 2012-08-07 (×10): 150 mg via ORAL
  Filled 2012-08-02 (×11): qty 1

## 2012-08-02 MED ORDER — HYDRALAZINE HCL 50 MG PO TABS
150.0000 mg | ORAL_TABLET | Freq: Four times a day (QID) | ORAL | Status: DC
  Administered 2012-08-02 – 2012-08-07 (×19): 150 mg via ORAL
  Filled 2012-08-02 (×22): qty 3

## 2012-08-02 MED ORDER — TRIAMCINOLONE ACETONIDE 0.1 % EX CREA
1.0000 "application " | TOPICAL_CREAM | Freq: Every day | CUTANEOUS | Status: DC | PRN
  Filled 2012-08-02: qty 15

## 2012-08-02 MED ORDER — HYDROMORPHONE HCL PF 1 MG/ML IJ SOLN
0.5000 mg | INTRAMUSCULAR | Status: DC | PRN
  Administered 2012-08-02: 1 mg via INTRAVENOUS
  Filled 2012-08-02: qty 1

## 2012-08-02 MED ORDER — LACTATED RINGERS IV SOLN
INTRAVENOUS | Status: DC | PRN
  Administered 2012-08-02: 11:00:00 via INTRAVENOUS

## 2012-08-02 MED ORDER — WARFARIN - PHARMACIST DOSING INPATIENT
Freq: Every day | Status: DC
  Administered 2012-08-06: 17:00:00

## 2012-08-02 MED ORDER — WARFARIN SODIUM 6 MG PO TABS: 6.0000 mg | ORAL_TABLET | ORAL | Status: DC

## 2012-08-02 MED ORDER — ARTIFICIAL TEARS OP OINT
TOPICAL_OINTMENT | OPHTHALMIC | Status: DC | PRN
  Administered 2012-08-02: 1 via OPHTHALMIC

## 2012-08-02 MED ORDER — OXYCODONE HCL 5 MG/5ML PO SOLN: 5.0000 mg | Freq: Once | ORAL | Status: DC | PRN

## 2012-08-02 MED ORDER — POTASSIUM CHLORIDE ER 10 MEQ PO TBCR
10.0000 meq | EXTENDED_RELEASE_TABLET | Freq: Every day | ORAL | Status: DC
  Administered 2012-08-03 – 2012-08-07 (×5): 10 meq via ORAL
  Filled 2012-08-02 (×5): qty 1

## 2012-08-02 MED ORDER — FENOFIBRATE 54 MG PO TABS
54.0000 mg | ORAL_TABLET | Freq: Every day | ORAL | Status: DC
  Administered 2012-08-02 – 2012-08-07 (×6): 54 mg via ORAL
  Filled 2012-08-02 (×6): qty 1

## 2012-08-02 MED ORDER — DIAZEPAM 5 MG PO TABS
5.0000 mg | ORAL_TABLET | Freq: Every day | ORAL | Status: DC
  Administered 2012-08-02 – 2012-08-06 (×5): 5 mg via ORAL
  Filled 2012-08-02 (×5): qty 1

## 2012-08-02 MED ORDER — NYSTATIN 100000 UNIT/GM EX POWD
Freq: Four times a day (QID) | CUTANEOUS | Status: DC
  Administered 2012-08-02 – 2012-08-07 (×8): via TOPICAL
  Filled 2012-08-02 (×2): qty 15

## 2012-08-02 MED ORDER — LORATADINE 10 MG PO TABS
10.0000 mg | ORAL_TABLET | Freq: Every day | ORAL | Status: DC
  Administered 2012-08-02 – 2012-08-07 (×6): 10 mg via ORAL
  Filled 2012-08-02 (×6): qty 1

## 2012-08-02 MED ORDER — WARFARIN SODIUM 3 MG PO TABS
3.0000 mg | ORAL_TABLET | ORAL | Status: DC
Start: 2012-08-02 — End: 2012-08-02
  Filled 2012-08-02: qty 1

## 2012-08-02 MED ORDER — ONDANSETRON HCL 4 MG PO TABS: 4.0000 mg | ORAL_TABLET | Freq: Four times a day (QID) | ORAL | Status: DC | PRN

## 2012-08-02 MED ORDER — WARFARIN SODIUM 3 MG PO TABS: 3.0000 mg | ORAL_TABLET | ORAL | Status: DC

## 2012-08-02 MED ORDER — GLYCOPYRROLATE 0.2 MG/ML IJ SOLN
INTRAMUSCULAR | Status: DC | PRN
  Administered 2012-08-02 (×2): 0.2 mg via INTRAVENOUS

## 2012-08-02 MED ORDER — METOCLOPRAMIDE HCL 10 MG PO TABS: 5.0000 mg | ORAL_TABLET | Freq: Three times a day (TID) | ORAL | Status: DC | PRN

## 2012-08-02 MED ORDER — HYDROCODONE-ACETAMINOPHEN 5-325 MG PO TABS
1.0000 | ORAL_TABLET | ORAL | Status: DC | PRN
  Administered 2012-08-02 – 2012-08-03 (×2): 2 via ORAL
  Administered 2012-08-04: 1 via ORAL
  Filled 2012-08-02 (×2): qty 2
  Filled 2012-08-02: qty 1

## 2012-08-02 MED ORDER — AMLODIPINE BESYLATE 5 MG PO TABS
5.0000 mg | ORAL_TABLET | Freq: Every day | ORAL | Status: DC
  Administered 2012-08-03 – 2012-08-04 (×2): 5 mg via ORAL
  Filled 2012-08-02 (×3): qty 1

## 2012-08-02 MED ORDER — MUPIROCIN 2 % EX OINT: TOPICAL_OINTMENT | Freq: Two times a day (BID) | CUTANEOUS | Status: DC

## 2012-08-02 MED ORDER — MUPIROCIN 2 % EX OINT
TOPICAL_OINTMENT | CUTANEOUS | Status: AC
  Filled 2012-08-02: qty 22

## 2012-08-02 MED ORDER — ONDANSETRON HCL 4 MG/2ML IJ SOLN
INTRAMUSCULAR | Status: DC | PRN
  Administered 2012-08-02: 4 mg via INTRAVENOUS

## 2012-08-02 MED ORDER — OXYCODONE HCL 5 MG PO TABS: 5.0000 mg | ORAL_TABLET | Freq: Once | ORAL | Status: DC | PRN

## 2012-08-02 MED ORDER — HYDROMORPHONE HCL PF 1 MG/ML IJ SOLN
INTRAMUSCULAR | Status: AC
  Administered 2012-08-02: 0.25 mg via INTRAVENOUS
  Filled 2012-08-02: qty 1

## 2012-08-02 MED ORDER — SODIUM CHLORIDE 0.9 % IV SOLN
INTRAVENOUS | Status: DC
  Administered 2012-08-02: 18:00:00 via INTRAVENOUS

## 2012-08-02 MED ORDER — METOCLOPRAMIDE HCL 5 MG/ML IJ SOLN: 5.0000 mg | Freq: Three times a day (TID) | INTRAMUSCULAR | Status: DC | PRN

## 2012-08-02 MED ORDER — OXYCODONE-ACETAMINOPHEN 5-325 MG PO TABS
1.0000 | ORAL_TABLET | ORAL | Status: DC | PRN
  Administered 2012-08-02: 2 via ORAL

## 2012-08-02 SURGICAL SUPPLY — 44 items
BANDAGE ESMARK 6X9 LF (GAUZE/BANDAGES/DRESSINGS) IMPLANT
BANDAGE GAUZE ELAST BULKY 4 IN (GAUZE/BANDAGES/DRESSINGS) ×2 IMPLANT
BLADE SAW SGTL MED 73X18.5 STR (BLADE) IMPLANT
BNDG COHESIVE 4X5 TAN STRL (GAUZE/BANDAGES/DRESSINGS) IMPLANT
BNDG COHESIVE 6X5 TAN STRL LF (GAUZE/BANDAGES/DRESSINGS) IMPLANT
BNDG ESMARK 6X9 LF (GAUZE/BANDAGES/DRESSINGS)
CLOTH BEACON ORANGE TIMEOUT ST (SAFETY) ×2 IMPLANT
CUFF TOURNIQUET SINGLE 34IN LL (TOURNIQUET CUFF) IMPLANT
CUFF TOURNIQUET SINGLE 44IN (TOURNIQUET CUFF) IMPLANT
DRAPE U-SHAPE 47X51 STRL (DRAPES) ×2 IMPLANT
DRSG ADAPTIC 3X8 NADH LF (GAUZE/BANDAGES/DRESSINGS) ×2 IMPLANT
DRSG PAD ABDOMINAL 8X10 ST (GAUZE/BANDAGES/DRESSINGS) ×2 IMPLANT
DURAPREP 26ML APPLICATOR (WOUND CARE) ×2 IMPLANT
ELECT REM PT RETURN 9FT ADLT (ELECTROSURGICAL) ×2
ELECTRODE REM PT RTRN 9FT ADLT (ELECTROSURGICAL) ×1 IMPLANT
GLOVE BIOGEL PI IND STRL 7.0 (GLOVE) ×1 IMPLANT
GLOVE BIOGEL PI IND STRL 9 (GLOVE) ×1 IMPLANT
GLOVE BIOGEL PI INDICATOR 7.0 (GLOVE) ×1
GLOVE BIOGEL PI INDICATOR 9 (GLOVE) ×1
GLOVE SURG ORTHO 9.0 STRL STRW (GLOVE) ×2 IMPLANT
GLOVE SURG SS PI 6.5 STRL IVOR (GLOVE) ×2 IMPLANT
GOWN PREVENTION PLUS XLARGE (GOWN DISPOSABLE) ×2 IMPLANT
GOWN SRG XL XLNG 56XLVL 4 (GOWN DISPOSABLE) ×1 IMPLANT
GOWN STRL NON-REIN XL XLG LVL4 (GOWN DISPOSABLE) ×1
KIT BASIN OR (CUSTOM PROCEDURE TRAY) ×2 IMPLANT
KIT ROOM TURNOVER OR (KITS) ×2 IMPLANT
MANIFOLD NEPTUNE II (INSTRUMENTS) IMPLANT
NS IRRIG 1000ML POUR BTL (IV SOLUTION) ×2 IMPLANT
PACK ORTHO EXTREMITY (CUSTOM PROCEDURE TRAY) ×2 IMPLANT
PAD ARMBOARD 7.5X6 YLW CONV (MISCELLANEOUS) ×2 IMPLANT
PAD CAST 4YDX4 CTTN HI CHSV (CAST SUPPLIES) IMPLANT
PADDING CAST COTTON 4X4 STRL (CAST SUPPLIES)
SPECIMEN JAR SMALL (MISCELLANEOUS) ×2 IMPLANT
SPONGE GAUZE 4X4 12PLY (GAUZE/BANDAGES/DRESSINGS) ×2 IMPLANT
SPONGE LAP 18X18 X RAY DECT (DISPOSABLE) ×2 IMPLANT
STAPLER VISISTAT 35W (STAPLE) IMPLANT
STOCKINETTE IMPERVIOUS LG (DRAPES) IMPLANT
SUCTION FRAZIER TIP 10 FR DISP (SUCTIONS) IMPLANT
SUT ETHILON 2 0 PSLX (SUTURE) ×4 IMPLANT
TOWEL OR 17X24 6PK STRL BLUE (TOWEL DISPOSABLE) ×2 IMPLANT
TOWEL OR 17X26 10 PK STRL BLUE (TOWEL DISPOSABLE) ×2 IMPLANT
TUBE CONNECTING 12X1/4 (SUCTIONS) IMPLANT
UNDERPAD 30X30 INCONTINENT (UNDERPADS AND DIAPERS) ×2 IMPLANT
WATER STERILE IRR 1000ML POUR (IV SOLUTION) IMPLANT

## 2012-08-02 NOTE — Transfer of Care (Signed)
Immediate Anesthesia Transfer of Care Note  Patient: Scott Norman  Procedure(s) Performed: Procedure(s) with comments: Right Foot 5th Ray Amputation  (Right) - Right Foot 5th Ray Amputation   Patient Location: PACU  Anesthesia Type:General  Level of Consciousness: awake, alert  and oriented  Airway & Oxygen Therapy: Patient Spontanous Breathing and Patient connected to nasal cannula oxygen  Post-op Assessment: Report given to PACU RN  Post vital signs: Reviewed and stable  Complications: No apparent anesthesia complications

## 2012-08-02 NOTE — Preoperative (Signed)
Beta Blockers   Reason not to administer Beta Blockers:Not Applicable 

## 2012-08-02 NOTE — Progress Notes (Signed)
ANTICOAGULATION CONSULT NOTE - Initial Consult  Pharmacy Consult for Coumadin Indication: atrial fibrillation  Allergies  Allergen Reactions  . Ace Inhibitors     Renal insufficiency  . Amlodipine     Severe leg edema  . Beta Adrenergic Blockers     bradycardia  . Statins     Patient Measurements: Height: 6' (182.9 cm) Weight: 223 lb 1.7 oz (101.2 kg) IBW/kg (Calculated) : 77.6  Vital Signs: Temp: 98.1 F (36.7 C) (03/05 1445) Temp src: Oral (03/05 1001) BP: 180/56 mmHg (03/05 1430) Pulse Rate: 52 (03/05 1500)  Labs:  Recent Labs  08/02/12 1019  APTT 42*  LABPROT 23.8*  INR 2.24*    Estimated Creatinine Clearance: 24.6 ml/min (by C-G formula based on Cr of 3).   Medical History: Past Medical History  Diagnosis Date  . Diabetes mellitus   . Degenerative disc disease, lumbar     back surgery 2011  . Gout   . Atrial fibrillation   . PVD (peripheral vascular disease)   . Aortic stenosis   . Hyperlipidemia   . H/O colonoscopy June 2007    normal  . Screening for osteoporosis Feb 2012    T -1.3 hip  . Heart murmur   . Anxiety   . Right knee DJD   . Hypertension     dr tim gollan   [facility] [place]  . Right knee DJD 02/28/2012  . Chronic kidney disease (CKD), stage III (moderate)     20%  . Renal artery stenosis, native, bilateral     s/p right RA stent 2012, pending left   . Bone infection of right foot   . Sleep apnea     does not use CPAP  . Stroke 2012    Rmbloic, with expressive aphasia-   . Shortness of breath     with activity  . Pneumonia     2013  . Anemia     2013 had Iron treatments    Medications:  Scheduled:  . amLODipine  5 mg Oral Daily  . buPROPion  150 mg Oral BID  . ceFAZolin      .  ceFAZolin (ANCEF) IV  1 g Intravenous Q6H  . [COMPLETED]  ceFAZolin (ANCEF) IV  2 g Intravenous Once  . diazepam  5 mg Oral QHS  . doxazosin  4 mg Oral QHS  . fenofibrate  54 mg Oral Daily  . hydrALAZINE  150 mg Oral QID  . isosorbide  mononitrate  60 mg Oral QPC supper  . loratadine  10 mg Oral Daily  . mupirocin ointment      . nystatin   Topical QID  . oxyCODONE-acetaminophen      . potassium chloride  10 mEq Oral Daily  . warfarin  6 mg Oral See admin instructions  . Warfarin - Pharmacist Dosing Inpatient   Does not apply q1800  . [DISCONTINUED] mupirocin ointment   Nasal BID    Assessment: 79 yo male to continue Coumadin for atrial fibrillation. Patient is s/p fifth ray amputation. Coumadin PTA was 6mg  daily except 3mg  on Wednesday. INR on admission is therapeutic at 2.24. H/H 11/33.3 and plts 326.  Will continue home dose of Coumadin.    Goal of Therapy:  INR 2-3 Monitor platelets by anticoagulation protocol: Yes   Plan:  Coumadin 6mg  daily, except 3mg  on Wednesday  Daily PT/INR   Micheline Chapman PharmD Candidate 08/02/2012,4:01 PM

## 2012-08-02 NOTE — Op Note (Signed)
OPERATIVE REPORT  DATE OF SURGERY: 08/02/2012  PATIENT:  Scott Norman,  79 y.o. male  PRE-OPERATIVE DIAGNOSIS:  Right Foot 5th Metatarsal Osteomyelitis   POST-OPERATIVE DIAGNOSIS:  Right Foot 5th Metatarsal Osteomyelitis   PROCEDURE:  Procedure(s): Right Foot 5th Ray Amputation   SURGEON:  Surgeon(s): Nadara Mustard, MD  ANESTHESIA:   general  EBL:  min ML  SPECIMEN:  No Specimen  TOURNIQUET:  * No tourniquets in log *  PROCEDURE DETAILS: Patient is a 79 year old gentleman with peripheral vascular disease osteomyelitis right foot fifth metatarsal. He has failed conservative care and presents at this time for surgical intervention. Risks and benefits were discussed including infection neurovascular injury nonhealing of the wound need for higher level amputation. Patient states he understands and wished to proceed at this time. Description of procedure patient brought to the operating room underwent a general anesthetic. After adequate levels and anesthesia obtained patient's right lower extremity was prepped using DuraPrep draped in a sterile field. A incision was made to include the bone and the ulcer around the fifth ray. The fifth ray was resected through the neck of the fifth metatarsal. The wound is irrigated with normal saline hemostasis was obtained. The incision was closed using 2-0 nylon. The wound was covered with Adaptic orthopedic sponges Kerlix and an and and a compressive wrap was applied to the leg for the right lateral venous stasis traumatic ulcer. Patient tolerated the procedure well was taken the PACU in stable condition.  PLAN OF CARE: Admit for overnight observation  PATIENT DISPOSITION:  PACU - hemodynamically stable.   Nadara Mustard, MD 08/02/2012 12:00 PM

## 2012-08-02 NOTE — Anesthesia Postprocedure Evaluation (Signed)
  Anesthesia Post-op Note  Patient: Scott Norman  Procedure(s) Performed: Procedure(s) with comments: Right Foot 5th Ray Amputation  (Right) - Right Foot 5th Ray Amputation   Patient Location: PACU  Anesthesia Type:General  Level of Consciousness: awake and alert   Airway and Oxygen Therapy: Patient Spontanous Breathing  Post-op Pain: none  Post-op Assessment: Post-op Vital signs reviewed, Patient's Cardiovascular Status Stable, Respiratory Function Stable, Patent Airway and No signs of Nausea or vomiting  Post-op Vital Signs: Reviewed and stable  Complications: No apparent anesthesia complications

## 2012-08-02 NOTE — H&P (Signed)
Scott Norman is an 79 y.o. male.   Chief Complaint: Osteomyelitis ulceration right foot fifth metatarsal HPI: Patient is a 79 year old gentleman with chronic ulceration fifth metatarsal right foot. Patient failed conservative wound care and presents for amputation do to osteomyelitis.  Past Medical History  Diagnosis Date  . Diabetes mellitus   . Degenerative disc disease, lumbar     back surgery 2011  . Gout   . Atrial fibrillation   . PVD (peripheral vascular disease)   . Aortic stenosis   . Hyperlipidemia   . H/O colonoscopy June 2007    normal  . Screening for osteoporosis Feb 2012    T -1.3 hip  . Heart murmur   . Anxiety   . Right knee DJD   . Hypertension     dr tim gollan   [facility] [place]  . Right knee DJD 02/28/2012  . Chronic kidney disease (CKD), stage III (moderate)     20%  . Renal artery stenosis, native, bilateral     s/p right RA stent 2012, pending left   . Bone infection of right foot   . Sleep apnea     does not use CPAP  . Stroke 2012    Rmbloic, with expressive aphasia-   . Shortness of breath     with activity  . Pneumonia     2013  . Anemia     2013 had Iron treatments    Past Surgical History  Procedure Laterality Date  . Shoulder surgery    . Lumbar spine surgery    . Renal artery stent  04/2011  . Tonsillectomy    . Appendectomy    . Throat surgery  1993    excess tissue removed to help with snoring  . Cardiac catheterization    . Total knee arthroplasty  02/28/2012    Procedure: TOTAL KNEE ARTHROPLASTY;  Surgeon: Nilda Simmer, MD;  Location: [facility] OR;  Service: Orthopedics;  Laterality: Right;    Family History  Problem Relation Age of Onset  . Hypertension Other   . Diabetes Other   . Pancreatic cancer Mother    Social History:  reports that he has never smoked. He has never used smokeless tobacco. He reports that he does not drink alcohol or use illicit drugs.  Allergies:  Allergies  Allergen Reactions  . Ace Inhibitors      Renal insufficiency  . Amlodipine     Severe leg edema  . Beta Adrenergic Blockers     bradycardia  . Statins     No prescriptions prior to admission    No results found for this or any previous visit (from the past 48 hour(s)). No results found.  Review of Systems  All other systems reviewed and are negative.    There were no vitals taken for this visit. Physical Exam  On examination patient has a palpable dorsalis pedis pulse there is ulceration over the fifth metatarsal head right foot which probes to bone. Radiographs confirm lytic changes consistent with osteomyelitis. Assessment/Plan Assessment: Osteomyelitis right foot fifth metatarsal.  Plan: Will plan for fifth ray amputation. Risks and benefits were discussed including infection neurovascular injury nonhealing of the wound need for additional surgery. Patient states he understands was pursued this time.  DUDA,MARCUS V 08/02/2012, 6:18 AM

## 2012-08-02 NOTE — Anesthesia Procedure Notes (Signed)
Procedure Name: LMA Insertion Date/Time: 08/02/2012 11:32 AM Performed by: Jefm Miles E Pre-anesthesia Checklist: Patient identified, Timeout performed, Emergency Drugs available, Suction available and Patient being monitored Patient Re-evaluated:Patient Re-evaluated prior to inductionOxygen Delivery Method: Circle system utilized Preoxygenation: Pre-oxygenation with 100% oxygen Intubation Type: IV induction LMA: LMA inserted LMA Size: 4.0 Number of attempts: 1 Tube secured with: Tape Dental Injury: Teeth and Oropharynx as per pre-operative assessment

## 2012-08-02 NOTE — Anesthesia Preprocedure Evaluation (Addendum)
Anesthesia Evaluation  Patient identified by MRN, date of birth, ID band Patient awake    Reviewed: Allergy & Precautions, H&P , NPO status , Patient's Chart, lab work & pertinent test results  History of Anesthesia Complications Negative for: history of anesthetic complications  Airway Mallampati: II TM Distance: >3 FB     Dental  (+) Poor Dentition and Dental Advisory Given   Pulmonary shortness of breath and with exertion, sleep apnea , pneumonia -, resolved,    Pulmonary exam normal       Cardiovascular hypertension, + Peripheral Vascular Disease and +CHF + dysrhythmias Atrial Fibrillation     Neuro/Psych Anxiety CVA    GI/Hepatic negative GI ROS, Neg liver ROS,   Endo/Other  diabetes  Renal/GU Renal InsufficiencyRenal disease     Musculoskeletal   Abdominal   Peds  Hematology negative hematology ROS (+)   Anesthesia Other Findings   Reproductive/Obstetrics                          Anesthesia Physical Anesthesia Plan  ASA: III  Anesthesia Plan: General   Post-op Pain Management:    Induction: Intravenous  Airway Management Planned: LMA  Additional Equipment:   Intra-op Plan:   Post-operative Plan: Extubation in OR  Informed Consent: I have reviewed the patients History and Physical, chart, labs and discussed the procedure including the risks, benefits and alternatives for the proposed anesthesia with the patient or authorized representative who has indicated his/her understanding and acceptance.   Dental advisory given  Plan Discussed with: CRNA, Anesthesiologist and Surgeon  Anesthesia Plan Comments:        Anesthesia Quick Evaluation

## 2012-08-03 ENCOUNTER — Encounter (HOSPITAL_COMMUNITY): Payer: Self-pay | Admitting: General Practice

## 2012-08-03 ENCOUNTER — Encounter: Payer: Self-pay | Admitting: Internal Medicine

## 2012-08-03 LAB — GLUCOSE, CAPILLARY
Glucose-Capillary: 112 mg/dL — ABNORMAL HIGH (ref 70–99)
Glucose-Capillary: 106 mg/dL — ABNORMAL HIGH (ref 70–99)

## 2012-08-03 MED ORDER — OXYCODONE-ACETAMINOPHEN 10-325 MG PO TABS: 1.0000 | ORAL_TABLET | ORAL | Status: DC | PRN

## 2012-08-03 NOTE — Evaluation (Signed)
Physical Therapy Evaluation Patient Details Name: Scott Norman MRN: [id number] DOB: 14-Sep-1930 Today's Date: 08/03/2012 Time: 4098-1191 PT Time Calculation (min): 39 min  PT Assessment / Plan / Recommendation Clinical Impression  pt is s/p R 5th ray amp and presents with multiple issues regarding return home/safety; Lengthy discussion with pt caregiver, [name] regarding D/C plan; Pt is an OP currently however his caregiver can't do any lifting due to cervical surgery that she had; pt is unsteady and unable to maintain TDWB without assist.  He can't functional from a w/c level per caregiver due to home limitations; He would benefit from STSNF, although unsure if this is an option;  Will continue to follow; In addiition pt was incontinent of urine multiple times during PT eval requiring gown changes, etc.    PT Assessment  Patient needs continued PT services    Follow Up Recommendations  SNF;Supervision/Assistance - 24 hour (HHPT if pt can't go SNF)    Does the patient have the potential to tolerate intense rehabilitation      Barriers to Discharge Other (comment) caregiver unable to do any lifiting at present    Equipment Recommendations  None recommended by PT    Recommendations for Other Services     Frequency Min 5X/week    Precautions / Restrictions Precautions Precautions: Fall;Other (comment) Precaution Comments: post op shoe Restrictions Weight Bearing Restrictions: Yes RLE Weight Bearing: Touchdown weight bearing   Pertinent Vitals/Pain Min c/o a "llittle" R foot pain     Mobility  Bed Mobility Bed Mobility: Supine to Sit Supine to Sit: 4: Min assist;3: Mod assist Details for Bed Mobility Assistance: assist with UB and multi-modal cues for task completion Transfers Transfers: Sit to Stand;Stand to Sit;Stand Pivot Transfers Sit to Stand: 4: Min assist;Without upper extremity assist;From bed Stand to Sit: To chair/3-in-1;4: Min assist;With upper extremity  assist Stand Pivot Transfers: 4: Min assist Details for Transfer Assistance: verbal cues for hand placenment adn RLE position Ambulation/Gait Ambulation/Gait Assistance: 4: Min assist Ambulation Distance (Feet): 6 Feet Assistive device: Rolling walker Ambulation/Gait Assistance Details: pt requires max verbal and tactile cues to maintain TDWB status; multi-modal cues for sequencing, safe use of RW, posture and wt shift to UEs Gait Pattern: Step-to pattern;Trunk flexed General Gait Details: unsteady    Exercises     PT Diagnosis: Difficulty walking;Generalized weakness  PT Problem List: Decreased strength;Decreased activity tolerance;Decreased balance;Decreased mobility;Decreased knowledge of use of DME;Decreased safety awareness;Decreased knowledge of precautions PT Treatment Interventions: DME instruction;Gait training;Functional mobility training;Therapeutic activities;Therapeutic exercise;Balance training;Patient/family education   PT Goals Acute Rehab PT Goals PT Goal Formulation: With patient Time For Goal Achievement: 08/10/12 Potential to Achieve Goals: Fair Pt will go Supine/Side to Sit: with modified independence PT Goal: Supine/Side to Sit - Progress: Goal set today Pt will go Sit to Supine/Side: with modified independence PT Goal: Sit to Supine/Side - Progress: Goal set today Pt will go Sit to Stand: with modified independence PT Goal: Sit to Stand - Progress: Goal set today Pt will Ambulate: 1 - 15 feet;with supervision;with rolling walker PT Goal: Ambulate - Progress: Goal set today Pt will Go Up / Down Stairs: 1-2 stairs;with min assist;with rolling walker PT Goal: Up/Down Stairs - Progress: Goal set today  Visit Information  Last PT Received On: 08/03/12 Assistance Needed: +1    Subjective Data  Subjective: I had a stroke Patient Stated Goal: home   Prior Functioning  Home Living Lives With: Alone Available Help at Discharge: Personal care attendant Type of  Home: House Home Access: Stairs to enter [employer] of Steps: 1 Home Layout: One level Home Adaptive Equipment: Walker - rolling;Quad cane Additional Comments: caregiver has had recent cervical  surgery is unable to do ANY lifting at htis time Prior Function Level of Independence: Needs assistance Needs Assistance: Dressing Gait Assistance: unsteady per caregiver Communication Communication: Expressive difficulties (expressive aphasia/word finding difficulty)    Cognition  Cognition Overall Cognitive Status: Appears within functional limits for tasks assessed/performed Arousal/Alertness: Awake/alert Orientation Level: Appears intact for tasks assessed Behavior During Session: [facility] for tasks performed    Extremity/Trunk Assessment Right Upper Extremity Assessment RUE ROM/Strength/Tone: Deficits Left Upper Extremity Assessment LUE ROM/Strength/Tone: Deficits Right Lower Extremity Assessment RLE ROM/Strength/Tone: Deficits;Unable to fully assess;Due to precautions RLE ROM/Strength/Tone Deficits: old RTKA, limited AROM at ankle due to bandaging/coban; knee  and hip grossly 3+/5 Left Lower Extremity Assessment LLE ROM/Strength/Tone: Deficits LLE ROM/Strength/Tone Deficits: AROM WFL; strength grossly 3+/5; pt caregiver states he has a lot of trouble with the left knee   Balance Dynamic Standing Balance Dynamic Standing - Balance Support: Right upper extremity supported;During functional activity Dynamic Standing - Level of Assistance: 4: Min assist  End of Session PT - End of Session Equipment Utilized During Treatment: Gait belt;Other (comment) (post op shoe) Activity Tolerance: Patient limited by fatigue Patient left: in chair;with call bell/phone within reach;with family/visitor present  GP Functional Assessment Tool Used: clinical judgement Functional Limitation: Mobility: Walking and moving around Mobility: Walking and Moving Around Current Status (A5409): At least 1  percent but less than 20 percent impaired, limited or restricted Mobility: Walking and Moving Around Goal Status [phone]): At least 1 percent but less than 20 percent impaired, limited or restricted   [facility] 08/03/2012, 11:32 AM

## 2012-08-03 NOTE — Clinical Social Work Psychosocial (Addendum)
Clinical Social Work Department  BRIEF PSYCHOSOCIAL ASSESSMENT  Patient: Scott Norman  Account Number: [id number]  Admit date: 08/02/12 Clinical Social Worker Sabino Niemann, MSW Date/Time: 08/03/2012 Referred by: Physician Date Referred: 08/03/2012 Referred for   SNF Placement   Other Referral:  Interview type: Patient  Other interview type: PSYCHOSOCIAL DATA  Living Status: lives with a caregiver Admitted from facility:  Level of care:  Primary support name: Ophelia Shoulder Primary support relationship to patient: daughter Degree of support available:  Strong and vested  CURRENT CONCERNS  Current Concerns   Post-Acute Placement   Other Concerns:  SOCIAL WORK ASSESSMENT / PLAN  CSW met with pt re: PT recommendation for SNF.   Pt live with a caregiver. SW spoke with patient candidly about the risks of returning home and the advantages of going to a ST-SNF for rehab to get a little bit stronger before returning home. Patient due to a stroke had some difficulty communicating but the patient was able to state clearly that he adamantly does not want to go to a SNF. SW spoke with patient's daughter and she expressed her concerns. SW explained to patient's daughter that we have to legally respect the patient's wishes. Per CM,  A CSW is going to be placed in the home with [facility] in case patient decides he needs to placed in a SNF from home.  Sw explained this to the patient's daughter.   Clinical Social Worker will sign off for now as social work intervention is no longer needed. Please consult [place] again if new need arises.         Assessment/plan status: Information/Referral to [facility]  Other assessment/ plan:  Information/referral to community resources:  None noted    PATIENT'S/FAMILY'S RESPONSE TO PLAN OF CARE:  Patient reported that he was appreciative of SW time and support but he is not interested in going to a facility at this time Clinical Social Worker will sign off for now as social  work intervention is no longer needed. Please consult [place] again if new need arises.      Sabino Niemann, MSW [phone]

## 2012-08-03 NOTE — Clinical Social Work Psychosocial (Addendum)
Clinical Social Work Department  BRIEF PSYCHOSOCIAL ASSESSMENT  Patient: Scott Norman  Account Number: [id number] Admit date: 08/03/2012 Clinical Social Worker Sabino Niemann, MSW Date/Time: 08/03/2012 3:30 PM Referred by: Physician Date Referred: 08/03/2012 Referred for   SNF Placement   Other Referral:  Interview type: Patient  And patient's son Other interview type:PSYCHOSOCIAL DATA  Living Status: Son Admitted from facility:  Level of care:  Primary support name: Jamarr Treinen Primary support relationship to patient: Son Degree of support available:  Strong and vested  CURRENT CONCERNS  Current Concerns   Post-Acute Placement   Other Concerns:  SOCIAL WORK ASSESSMENT / PLAN  CSW met with pt re: PT recommendation for SNF.   Pt lives alone  CSW explained placement process and answered questions.   Pt pre-registered at [facility]  CSW completed FL2 and initiatedSNF search.     Assessment/plan status: Information/Referral to [facility]  Other assessment/ plan:  Information/referral to community resources:  SNF   PTAR   PATIENT'S/FAMILY'S RESPONSE TO PLAN OF CARE:  Pt and patient's report they are  agreeable to ST SNF in order to increase strength and independence with mobility prior to return home  Pt verbalized understanding of placement process and appreciation for CSW assist.   Sabino Niemann, MSW [phone]

## 2012-08-03 NOTE — Clinical Social Work Placement (Addendum)
Clinical Social Work Department  CLINICAL SOCIAL WORK PLACEMENT NOTE  05/07/2012  Patient:Scott Norman Account Number:  [id number] Admit date: 08/03/2012 Clinical Social Worker: Sabino Niemann MSW Date/time: 08/03/2012 1322 PM  Clinical Social Work is seeking post-discharge placement for this patient at the following level of care: SKILLED NURSING (*CSW will update this form in Epic as items are completed)  3/6/2014Patient/family provided with [facility] Department of Clinical Social Work's list of facilities offering this level of care within the geographic area requested by the patient (or if unable, by the patient's family).  08/03/2012 Patient/family informed of their freedom to choose among providers that offer the needed level of care, that participate in Medicare, Medicaid or managed care program needed by the patient, have an available bed and are willing to accept the patient.  08/03/2012 Patient/family informed of [facility]' ownership interest in [facility], as well as of the fact that they are under no obligation to receive care at this facility.  PASARR submitted to EDS on existing PASARR number received from EDS on  FL2 transmitted to all facilities in geographic area requested by pt/family on 08/03/2012  FL2 transmitted to all facilities within larger geographic area on  Patient informed that his/her managed care company has contracts with or will negotiate with certain facilities, including the following:  Patient/family informed of bed offers received:  Patient chooses bed at [facility] Physician recommends and patient chooses bed at  Patient to be transferred to on  Patient to be transferred to facility by [employer] The following physician request were entered in Epic:  Additional Comments:

## 2012-08-03 NOTE — Progress Notes (Signed)
Orthopedic Tech Progress Note Patient Details:  Scott Norman [birth year] [id number] Spoke with patient in room. He already has a post op shoe from home and is currently wearing said shoe.  Patient ID: Scott Norman, male   DOB: 07-21-1930, 79 y.o.   MRN: [id number]   [name] 08/03/2012, 1:01 PM

## 2012-08-03 NOTE — Discharge Summary (Signed)
Physician Discharge Summary  Patient ID: Scott Norman MRN: [id number] DOB/AGE: 1930-08-05 79 y.o.  Admit date: 08/02/2012 Discharge date: 08/03/2012  Admission Diagnoses: Osteomyelitis right foot fifth metatarsal Discharge Diagnoses: Same Active Problems:   * No active hospital problems. *   Discharged Condition: stable  Hospital Course: Patient's hospital course was essentially unremarkable he underwent a fifth ray amputation right foot. Postoperatively patient was stable and was discharged to home.  Consults: None  Significant Diagnostic Studies: labs: Routine labs  Treatments: surgery: See operative note  Discharge Exam: Blood pressure 170/66, pulse 57, temperature 97.8 F (36.6 C), temperature source Oral, resp. rate 18, height 6' (1.829 m), weight 101.2 kg (223 lb 1.7 oz), SpO2 97.00%. Incision/Wound: clean and dry  Disposition: 03-Skilled Nursing Facility  Discharge Orders   Future Appointments Scott Norman Department Dept Phone   08/16/2012 12:00 PM [facility] [phone]   09/25/2012 2:00 PM Scott Iba, MD [facility] [phone]   Future Orders Complete By Expires     Call MD / Call 911  As directed     Comments:      If you experience chest pain or shortness of breath, CALL 911 and be transported to the hospital emergency room.  If you develope a fever above 101 F, pus (white drainage) or increased drainage or redness at the wound, or calf pain, call your surgeon's office.    Constipation Prevention  As directed     Comments:      Drink plenty of fluids.  Prune juice may be helpful.  You may use a stool softener, such as Colace (over the counter) 100 mg twice a day.  Use MiraLax (over the counter) for constipation as needed.    Diet - low sodium heart healthy  As directed     Elevate operative extremity  As directed     Increase activity slowly as tolerated  As directed     Weight bearing as tolerated   As directed     Scheduling Instructions:      Minimize weightbearing right lower extremity.        Medication List    TAKE these medications       ALPRAZolam 0.5 MG tablet  Commonly known as:  XANAX  Take 1 tablet (0.5 mg total) by mouth 3 (three) times daily as needed for anxiety.     amLODipine 5 MG tablet  Commonly known as:  NORVASC     buPROPion 150 MG 12 hr tablet  Commonly known as:  WELLBUTRIN SR  Take 1 tablet (150 mg total) by mouth 2 (two) times daily.     cephALEXin 500 MG capsule  Commonly known as:  KEFLEX  Take 1 capsule (500 mg total) by mouth 4 (four) times daily.     cetirizine 10 MG tablet  Commonly known as:  ZYRTEC  Take 10 mg by mouth daily as needed for allergies.     cholecalciferol 1000 UNITS tablet  Commonly known as:  VITAMIN D  Take 2,000 Units by mouth daily.     diazepam 5 MG tablet  Commonly known as:  VALIUM  Take 5 mg by mouth at bedtime.     doxazosin 4 MG tablet  Commonly known as:  CARDURA  Take 1 tablet (4 mg total) by mouth at bedtime.     fenofibrate 145 MG tablet  Commonly known as:  TRICOR  Take 1 tablet (145 mg total) by mouth daily.  glucose blood test strip  Commonly known as:  BAYER CONTOUR NEXT TEST  Use as instructed     hydrALAZINE 100 MG tablet  Commonly known as:  APRESOLINE  Take 1.5 tablets (150 mg total) by mouth 4 (four) times daily.     HYDROcodone-acetaminophen 10-325 MG per tablet  Commonly known as:  NORCO  Take 1 tablet by mouth daily as needed for pain.     isosorbide mononitrate 60 MG 24 hr tablet  Commonly known as:  IMDUR  Take 1 tablet (60 mg total) by mouth daily after supper. Take 1 tablet at bed time.  If hypertension in am then take one as needed in am     ketoconazole 2 % cream  Commonly known as:  NIZORAL  Apply 1 application topically daily as needed (rash).     KLOR-CON 10 10 MEQ tablet  Generic drug:  potassium chloride     nitroGLYCERIN 0.4 MG SL tablet  Commonly known  as:  NITROSTAT  Place 0.4 mg under the tongue every 5 (five) minutes as needed. For chest pain     nystatin powder  Commonly known as:  MYCOSTATIN  Apply topically 4 (four) times daily.     oxyCODONE-acetaminophen 10-325 MG per tablet  Commonly known as:  PERCOCET  Take 1 tablet by mouth every 4 (four) hours as needed for pain.     polyethylene glycol packet  Commonly known as:  MIRALAX / GLYCOLAX  Take 17 g by mouth daily as needed (constipation).     triamcinolone cream 0.1 %  Commonly known as:  KENALOG  Apply 1 application topically daily as needed (rash).     warfarin 6 MG tablet  Commonly known as:  COUMADIN  Take 6 mg by mouth See admin instructions. 1 tablet every day except 1/2 tablet on Wednesday.           Follow-up Information   Follow up with DUDA,MARCUS V, MD In 1 week.   Contact information:   [address] [place] [address] [phone]       Signed: Nadara Mustard 08/03/2012, 6:30 AM

## 2012-08-03 NOTE — Progress Notes (Signed)
I spoke with patient and patent's son. He is now agreeable to SNF. SW will fax out all clincials. Patient's first choice is [facility].  Sabino Niemann, MSW,  [phone]

## 2012-08-03 NOTE — Progress Notes (Addendum)
CARE MANAGEMENT NOTE 08/03/2012  Patient:  Scott Norman, Scott Norman   Account Number:  [id number]  Date Initiated:  08/03/2012  Documentation initiated by:  Vance Peper  Subjective/Objective Assessment:   79 yr old male s/p amputation of right fifth toe.     Action/Plan:   CM spoke with patient and family concerning home health and DME needs at discharge. Son has concerns regarding patient's ability to navigate safely at home. PT to work with patient.   Anticipated DC Date:  08/03/2012   Anticipated DC Plan:  HOME W HOME HEALTH SERVICES      DC Planning Services  CM consult      Choice offered to / List presented to:     DME arranged  OTHER - SEE COMMENT      DME agency  [facility]     HH arranged  HH-1 RN  HH-2 PT  HH-6 SOCIAL WORKER      HH agency  [facility]   Status of service:  Completed, signed off Medicare Important Message given?   (If response is "NO", the following Medicare IM given date fields will be blank) Date Medicare IM given:   Date Additional Medicare IM given:    Discharge Disposition:  HOME W HOME HEALTH SERVICES  Per UR Regulation:    If discussed at Long Length of Stay Meetings, dates discussed:    Comments:  08/03/12 11:00 Vance Peper, RN BSN Case Manager CM spoke with Dr. Lajoyce Corners concerning patient's family concerns. Received orders for home health RN,  PT and SW. Social worker to speak with patient and family.

## 2012-08-03 NOTE — Progress Notes (Signed)
ANTICOAGULATION CONSULT NOTE - Follow Up Consult  Pharmacy Consult for Coumadin Indication: atrial fibrillation  Allergies  Allergen Reactions  . Ace Inhibitors     Renal insufficiency  . Amlodipine     Severe leg edema  . Beta Adrenergic Blockers     bradycardia  . Statins     Patient Measurements: Height: 6' (182.9 cm) Weight: 223 lb 1.7 oz (101.2 kg) IBW/kg (Calculated) : 77.6  Vital Signs: Temp: 98.6 F (37 C) (03/06 0709) BP: 164/62 mmHg (03/06 0709) Pulse Rate: 52 (03/06 0709)  Labs:  Recent Labs  08/02/12 1019 08/03/12 0500  APTT 42*  --   LABPROT 23.8* 25.9*  INR 2.24* 2.51*    Estimated Creatinine Clearance: 24.6 ml/min (by C-G formula based on Cr of 3).  Assessment: 81yom continues on coumadin for afib s/p fifth ray amputation. INR remains therapeutic on home dose of 6mg  daily except 3mg  on Wednesday. No CBC today. No bleeding reported.  Goal of Therapy:  INR 2-3 Monitor platelets by anticoagulation protocol: Yes   Plan:  1) Coumadin 6mg  x 1 tonight per home regimen 2) Follow up INR in AM if patient still here  Fredrik Rigger 08/03/2012,9:02 AM

## 2012-08-03 NOTE — Progress Notes (Signed)
CARE MANAGEMENT NOTE 08/03/2012  Anticipated DC Date:  08/03/2012   Anticipated DC Plan:  SKILLED NURSING FACILITY  In-house referral  Clinical Social Worker  Discharge Disposition:  SKILLED NURSING FACILITY  Per UR Regulation:    If discussed at [employer] of Stay Meetings, dates discussed:    Comments:  08/03/12 14:00 Vance Peper, RN BSN Patient has decided to go to SNF, Social worker has spoken with family as well as patient. CM signing off.

## 2012-08-04 LAB — GLUCOSE, CAPILLARY
Glucose-Capillary: 101 mg/dL — ABNORMAL HIGH (ref 70–99)
Glucose-Capillary: 157 mg/dL — ABNORMAL HIGH (ref 70–99)

## 2012-08-04 LAB — PROTIME-INR
INR: 3.1 — ABNORMAL HIGH (ref 0.00–1.49)
Prothrombin Time: 30.3 seconds — ABNORMAL HIGH (ref 11.6–15.2)

## 2012-08-04 NOTE — Discharge Summary (Signed)
  Patient was not felt to be safe for discharge to home and patient will be discharged to skilled nursing facility today. Plan for followup in the office in 2 weeks. Minimize weightbearing right lower extremity. Dressing clean dry and intact.

## 2012-08-04 NOTE — Progress Notes (Signed)
Agree with PTA.    Tyrihanna Wingert, PT [phone]  

## 2012-08-04 NOTE — Progress Notes (Signed)
The facility is out of power and cannot accept the patient at this time. The AD will let me know when they get power back.   Sabino Niemann, MSW, [employer] [phone]

## 2012-08-04 NOTE — Progress Notes (Signed)
ANTICOAGULATION CONSULT NOTE - Follow Up Consult  Pharmacy Consult for Coumadin Indication: atrial fibrillation  Allergies  Allergen Reactions  . Ace Inhibitors     Renal insufficiency  . Amlodipine     Severe leg edema  . Beta Adrenergic Blockers     bradycardia  . Statins     Patient Measurements: Height: 6' (182.9 cm) Weight: 223 lb 1.7 oz (101.2 kg) IBW/kg (Calculated) : 77.6  Vital Signs: Temp: 98.3 F (36.8 C) (03/07 0524) BP: 165/65 mmHg (03/07 0524) Pulse Rate: 68 (03/07 0524)  Labs:  Recent Labs  08/02/12 1019 08/03/12 0500 08/04/12 0500  APTT 42*  --   --   LABPROT 23.8* 25.9* 30.3*  INR 2.24* 2.51* 3.10*    Estimated Creatinine Clearance: 24.6 ml/min (by C-G formula based on Cr of 3).  Assessment: 81yom continues on coumadin for afib s/p fifth ray amputation. INR is slightly supratherapeutic today. No CBC today. No bleeding reported.  Home dose: 6mg  daily except 3mg  on Wednesdays  Goal of Therapy:  INR 2-3 Monitor platelets by anticoagulation protocol: Yes   Plan:  If patient discharges today, recommend holding tonight's dose and resuming home dose 3/8.  Fredrik Rigger 08/04/2012,1:20 PM

## 2012-08-04 NOTE — Progress Notes (Signed)
Physical Therapy Treatment Patient Details Name: CHAOS CARLILE MRN: [id number] DOB: 1931/01/06 Today's Date: 08/04/2012 Time: 5284-1324 PT Time Calculation (min): 19 min  PT Assessment / Plan / Recommendation Comments on Treatment Session  Patient agreeable to OOB. Trouble maintaining TWB. Progressing slowly    Follow Up Recommendations  SNF;Supervision/Assistance - 24 hour     Does the patient have the potential to tolerate intense rehabilitation     Barriers to Discharge        Equipment Recommendations  None recommended by PT    Recommendations for Other Services    Frequency Min 3X/week   Plan Discharge plan remains appropriate;Frequency needs to be updated    Precautions / Restrictions Precautions Precautions: Fall;Other (comment) Precaution Comments: post op shoe Restrictions Weight Bearing Restrictions: Yes RLE Weight Bearing: Touchdown weight bearing   Pertinent Vitals/Pain     Mobility  Bed Mobility Supine to Sit: 4: Min assist Details for Bed Mobility Assistance: assist with LE out of bed and some shoulder control Transfers Sit to Stand: 4: Min assist;Without upper extremity assist;From bed Stand to Sit: To chair/3-in-1;4: Min assist;With upper extremity assist Stand Pivot Transfers: 4: Min assist Details for Transfer Assistance: verbal cues for hand placenment and RLE position. A for stability and balance Ambulation/Gait Ambulation/Gait Assistance: Not tested (comment)    Exercises     PT Diagnosis:    PT Problem List:   PT Treatment Interventions:     PT Goals Acute Rehab PT Goals PT Goal: Supine/Side to Sit - Progress: Progressing toward goal PT Goal: Sit to Stand - Progress: Progressing toward goal  Visit Information  Last PT Received On: 08/04/12 Assistance Needed: +1    Subjective Data      Cognition  Cognition Overall Cognitive Status: Appears within functional limits for tasks assessed/performed Arousal/Alertness:  Awake/alert Orientation Level: Appears intact for tasks assessed Behavior During Session: [facility] for tasks performed    Balance     End of Session PT - End of Session Equipment Utilized During Treatment: Gait belt;Other (comment) Activity Tolerance: Patient limited by fatigue Patient left: in chair;with call bell/phone within reach;with family/visitor present   GP     Robinette, Adline Potter 08/04/2012, 11:52 AM  08/04/2012 Fredrich Birks PTA [phone] pager [phone] office

## 2012-08-05 LAB — GLUCOSE, CAPILLARY
Glucose-Capillary: 78 mg/dL (ref 70–99)
Glucose-Capillary: 128 mg/dL — ABNORMAL HIGH (ref 70–99)

## 2012-08-05 MED ORDER — AMLODIPINE BESYLATE 5 MG PO TABS
5.0000 mg | ORAL_TABLET | Freq: Every day | ORAL | Status: DC
  Administered 2012-08-05 – 2012-08-06 (×2): 5 mg via ORAL
  Filled 2012-08-05 (×3): qty 1

## 2012-08-05 MED ORDER — WARFARIN SODIUM 6 MG PO TABS
6.0000 mg | ORAL_TABLET | Freq: Once | ORAL | Status: AC
  Administered 2012-08-05: 6 mg via ORAL
  Filled 2012-08-05: qty 1

## 2012-08-05 NOTE — Clinical Social Work Note (Signed)
[facility] unable to accept admissions over weekend due to loss of power, per admissions director, Morrie Sheldon [phone]. RN notified. CSW will continue to follow.  Dellie Burns, MSW, LCSWA [phone] (Weekends 8:00am-4:30pm)

## 2012-08-05 NOTE — Progress Notes (Signed)
Subjective: 3 Days Post-Op Procedure(s) (LRB): Right Foot 5th Ray Amputation  (Right) Patient reports pain as mild.  No complaints just wanting to go to skilled facility.  Objective: Vital signs in last 24 hours: Temp:  [98.1 F (36.7 C)-98.5 F (36.9 C)] 98.4 F (36.9 C) (03/08 0704) Pulse Rate:  [53-87] 68 (03/08 0704) Resp:  [17] 17 (03/07 1400) BP: (124-183)/(54-115) 124/115 mmHg (03/08 0704) SpO2:  [97 %-100 %] 100 % (03/08 0704)  Intake/Output from previous day: 03/07 0701 - 03/08 0700 In: 390 [P.O.:390] Out: 450 [Urine:450] Intake/Output this shift:    No results found for this basename: HGB,  in the last 72 hours No results found for this basename: WBC, RBC, HCT, PLT,  in the last 72 hours No results found for this basename: NA, K, CL, CO2, BUN, CREATININE, GLUCOSE, CALCIUM,  in the last 72 hours  Recent Labs  08/04/12 0500 08/05/12 0545  INR 3.10* 2.56*   Physical exam: General : awake and alert Right lower leg: Sensation intact distally Dressing right foot clean dry and intact  Assessment/Plan: 3 Days Post-Op Procedure(s) (LRB): Right Foot 5th Ray Amputation  (Right) Discharge to SNF  CLARK, GILBERT 08/05/2012, 8:30 AM

## 2012-08-05 NOTE — Progress Notes (Signed)
ANTICOAGULATION CONSULT NOTE - Follow Up Consult  Pharmacy Consult for Coumadin Indication: atrial fibrillation  Allergies  Allergen Reactions  . Ace Inhibitors     Renal insufficiency  . Amlodipine     Severe leg edema  . Beta Adrenergic Blockers     bradycardia  . Statins     Labs:  Recent Labs  08/03/12 0500 08/04/12 0500 08/05/12 0545  LABPROT 25.9* 30.3* 26.3*  INR 2.51* 3.10* 2.56*    Estimated Creatinine Clearance: 24.6 ml/min (by C-G formula based on Cr of 3).  Assessment: 81yom continues on coumadin for afib s/p fifth ray amputation. INR is therapeutic today. No CBC today. No bleeding reported.  Home dose: 6mg  daily except 3mg  on Wednesdays  Goal of Therapy:  INR 2-3 Monitor platelets by anticoagulation protocol: Yes   Plan: 1) Continue home dose - 6 mg tonight 2) Continue daily INR  Thank you. [name], PharmD [phone]   Elwin Sleight 08/05/2012,10:39 AM

## 2012-08-05 NOTE — Progress Notes (Signed)
Notified by patient's son that amlodipine caused significant edema to patient's legs and he had stopped medication prior to admission to hospital. Patient has received 2 doses this admission. Today's dose not given and Dr. Magnus Ivan notified of request to stop medication. Orders given. PRIDDY, Scott Norman

## 2012-08-05 NOTE — Discharge Summary (Signed)
  No change in patient condition awaiting discharge to SNF. Will discharge to SNF if facility has power and accepting patients.

## 2012-08-05 NOTE — Progress Notes (Signed)
Patient blood pressure 212/67. Notified dr on call (Dr Magnus Ivan). Was instructed to place patient back on Norvasc 5mg  daily.

## 2012-08-06 LAB — PROTIME-INR
INR: 1.82 — ABNORMAL HIGH (ref 0.00–1.49)
Prothrombin Time: 20.4 seconds — ABNORMAL HIGH (ref 11.6–15.2)

## 2012-08-06 MED ORDER — WARFARIN SODIUM 3 MG PO TABS
9.0000 mg | ORAL_TABLET | Freq: Once | ORAL | Status: AC
  Administered 2012-08-06: 9 mg via ORAL
  Filled 2012-08-06: qty 1

## 2012-08-06 NOTE — Progress Notes (Deleted)
Patient discharged to home at this time.  All discharge instructions reviewed with patient.  Patient able to accurately teach back all instructions given.  Alert and oriented x4.  Gait slow and steady.  Back brace in place.  All belongings packed by resident's spouse.  Written scripts given to patient for Pain medication as well as muscle relaxer.    Scott Norman

## 2012-08-06 NOTE — Progress Notes (Signed)
Subjective: 4 Days Post-Op Procedure(s) (LRB): Right Foot 5th Ray Amputation  (Right) Patient reports pain as mild.    Objective: Vital signs in last 24 hours: Temp:  [98.1 F (36.7 C)-98.7 F (37.1 C)] 98.5 F (36.9 C) (03/09 0641) Pulse Rate:  [66-100] 88 (03/09 0641) Resp:  [16-18] 16 (03/09 0641) BP: (142-212)/(60-78) 187/62 mmHg (03/09 0641) SpO2:  [95 %-97 %] 95 % (03/09 0641)  Intake/Output from previous day:   Intake/Output this shift:    No results found for this basename: HGB,  in the last 72 hours No results found for this basename: WBC, RBC, HCT, PLT,  in the last 72 hours No results found for this basename: NA, K, CL, CO2, BUN, CREATININE, GLUCOSE, CALCIUM,  in the last 72 hours  Recent Labs  08/05/12 0545 08/06/12 0705  INR 2.56* 1.82*   Lower EXtremities: Incision: dressing C/D/I Compartment soft Bilateral legs no pitting edema   Assessment/Plan: 4 Days Post-Op Procedure(s) (LRB): Right Foot 5th Ray Amputation  (Right) HTN Plan for discharge tomorrow to SNF  Monitor blood pressure and for lower extremity edema due to Norvasc being resumed  Blennerhassett, GILBERT 08/06/2012, 8:31 AM

## 2012-08-06 NOTE — Progress Notes (Signed)
ANTICOAGULATION CONSULT NOTE - Follow Up Consult  Pharmacy Consult for Coumadin Indication: atrial fibrillation  Allergies  Allergen Reactions  . Ace Inhibitors     Renal insufficiency  . Amlodipine     Severe leg edema  . Beta Adrenergic Blockers     bradycardia  . Statins     Labs:  Recent Labs  08/04/12 0500 08/05/12 0545 08/06/12 0705  LABPROT 30.3* 26.3* 20.4*  INR 3.10* 2.56* 1.82*    Estimated Creatinine Clearance: 24.6 ml/min (by C-G formula based on Cr of 3).  Assessment: 81yom continues on coumadin for afib s/p fifth ray amputation. INR is sub-therapeutic today. No CBC today. No bleeding reported.  Home dose: 6mg  daily except 3mg  on Wednesdays  Goal of Therapy:  INR 2-3 Monitor platelets by anticoagulation protocol: Yes   Plan: 1) Coumadin 9 mg po x 1 tonight then resume home dose 2) Continue daily INR  Thank you. [name], PharmD [phone]   Elwin Sleight 08/06/2012,10:44 AM

## 2012-08-07 ENCOUNTER — Emergency Department: Payer: Self-pay | Admitting: Emergency Medicine

## 2012-08-07 ENCOUNTER — Other Ambulatory Visit: Payer: Self-pay

## 2012-08-07 ENCOUNTER — Telehealth: Payer: Self-pay

## 2012-08-07 LAB — CBC
MCHC: 32.7 g/dL (ref 32.0–36.0)
MCV: 89 fL (ref 80–100)
RBC: 3.95 10*6/uL — ABNORMAL LOW (ref 4.40–5.90)
RDW: 16 % — ABNORMAL HIGH (ref 11.5–14.5)

## 2012-08-07 LAB — BASIC METABOLIC PANEL
Anion Gap: 4 — ABNORMAL LOW (ref 7–16)
BUN: 32 mg/dL — ABNORMAL HIGH (ref 7–18)
Calcium, Total: 8.2 mg/dL — ABNORMAL LOW (ref 8.5–10.1)
Chloride: 111 mmol/L — ABNORMAL HIGH (ref 98–107)
Co2: 25 mmol/L (ref 21–32)
Creatinine: 2.06 mg/dL — ABNORMAL HIGH (ref 0.60–1.30)
EGFR (Non-African Amer.): 30 — ABNORMAL LOW
Potassium: 5 mmol/L (ref 3.5–5.1)
Sodium: 140 mmol/L (ref 136–145)

## 2012-08-07 LAB — GLUCOSE, CAPILLARY
Glucose-Capillary: 106 mg/dL — ABNORMAL HIGH (ref 70–99)
Glucose-Capillary: 93 mg/dL (ref 70–99)

## 2012-08-07 LAB — TROPONIN I: Troponin-I: <0.02 ng/mL

## 2012-08-07 NOTE — Progress Notes (Signed)
Patient transported to [facility] with significant other for continuous rehabilitation.

## 2012-08-07 NOTE — Telephone Encounter (Signed)
Pt daughter [name], called and states pt was in the hospital this past weekend,and had to have toe amputated,  and states the hospital  did not have that he takes Nitroglycerin on file, or that DR Mariah Milling d/c pt Amlodipine on file. Pt is at [facility] and would like to make sure this is documented in his chart. Please call daughter.

## 2012-08-07 NOTE — Telephone Encounter (Signed)
I attempted to reach someone at [facility] No answer Will try again

## 2012-08-08 NOTE — Telephone Encounter (Signed)
Pt's dtr, [name], informed of plan and changes Understanding verb

## 2012-08-08 NOTE — Telephone Encounter (Signed)
I spoke with pt's nurse at [facility], Reita Cliche.  He says pt came back from [facility] at 0600 this am and pt is resting well BP this am is 150/70 Reita Cliche is faxing me his med list and med changes from ER visit I will review with Dr. Mariah Milling and will fax changes to [phone] attn:Reita Cliche

## 2012-08-08 NOTE — Telephone Encounter (Signed)
lmtcb at [facility] rehab

## 2012-08-08 NOTE — Telephone Encounter (Signed)
Received fax re: ER med changes-"hydralazine 150 mg po qd in addition to QID PRN for BP >180 systolic" and "isosorbide mononitrate 60 mg PO QD PRN in addition to daily doses for BP > 180 systolic" He also faxed MAR from [facility] Will have Dr. Mariah Milling review

## 2012-08-08 NOTE — Telephone Encounter (Signed)
I spoke with [name], pts dtr. She says pt was actually taken to [facility] ER last night for elevated BP (SBP=214-238) He was at [facility] and had not received any BP meds since noon They called 911 at 6:30 pm When he arrived in ER pt told MD to page Dr. Mariah Milling but they did not do this [name] was able to call our answering service and had him paged to her phone She explained what was going on and Dr. Mariah Milling spoke with Dr. Junie Spencer in ED They gave pt IV hydralazine and BP came down to 195 systolic dtr left and thinks pt went back to [facility] According to [facility] system, it does not appear he got admitted dtr thinks high BP has something to do with pts pain level and the fact that he has been refusing pain meds at rehab d/t fear of constipation  He is also very anxious and angry about being at [facility] She ask if Dr. Mariah Milling wants to make any changes I told her I would call [facility] and try to talk with nurse there to see what his BP is I will then discuss with Dr. Mariah Milling and call her back Understanding verb

## 2012-08-08 NOTE — Telephone Encounter (Signed)
I was able to speak with pt's nurse again at [facility] She says BP this afternoon is 188/70, HR=58 BPM Dr. Mariah Milling gave orders to "increase Cardura to 8 mg PO BID and hold amlodipine" VO Dr. Alvis Lemmings, RN Orders will be faxed to [facility] at [phone]

## 2012-08-09 LAB — PROTIME-INR
INR: 2.2
Prothrombin Time: 23.6 secs — ABNORMAL HIGH (ref 11.5–14.7)

## 2012-08-14 ENCOUNTER — Other Ambulatory Visit: Payer: Self-pay | Admitting: Orthopedic Surgery

## 2012-08-14 DIAGNOSIS — L97919 Non-pressure chronic ulcer of unspecified part of right lower leg with unspecified severity: Secondary | ICD-10-CM

## 2012-08-14 DIAGNOSIS — R609 Edema, unspecified: Secondary | ICD-10-CM

## 2012-08-16 ENCOUNTER — Ambulatory Visit (INDEPENDENT_AMBULATORY_CARE_PROVIDER_SITE_OTHER): Payer: Medicare Other

## 2012-08-16 ENCOUNTER — Ambulatory Visit
Admission: RE | Admit: 2012-08-16 | Discharge: 2012-08-16 | Disposition: A | Discharge status: Home / Self Care | Payer: Medicare Other | Source: Ambulatory Visit | Attending: Orthopedic Surgery | Admitting: Orthopedic Surgery

## 2012-08-16 ENCOUNTER — Other Ambulatory Visit: Payer: Self-pay

## 2012-08-16 DIAGNOSIS — L97919 Non-pressure chronic ulcer of unspecified part of right lower leg with unspecified severity: Secondary | ICD-10-CM

## 2012-08-16 DIAGNOSIS — I639 Cerebral infarction, unspecified: Secondary | ICD-10-CM

## 2012-08-16 DIAGNOSIS — R609 Edema, unspecified: Secondary | ICD-10-CM

## 2012-08-16 DIAGNOSIS — Z7901 Long term (current) use of anticoagulants: Secondary | ICD-10-CM

## 2012-08-16 DIAGNOSIS — I6350 Cerebral infarction due to unspecified occlusion or stenosis of unspecified cerebral artery: Secondary | ICD-10-CM

## 2012-08-16 DIAGNOSIS — I4891 Unspecified atrial fibrillation: Secondary | ICD-10-CM

## 2012-08-16 MED ORDER — DOXAZOSIN MESYLATE 8 MG PO TABS: 8.0000 mg | ORAL_TABLET | Freq: Two times a day (BID) | ORAL | Status: DC

## 2012-08-22 ENCOUNTER — Telehealth: Payer: Self-pay

## 2012-08-22 NOTE — Telephone Encounter (Signed)
Call pt to assess Bps Try [facility] then home #

## 2012-08-23 NOTE — Telephone Encounter (Signed)
Labile blood pressure if the low blood pressure is accurate. Perhaps they can continue to monitor and send [place] numbers. Also need to no medications he is taking

## 2012-08-23 NOTE — Telephone Encounter (Signed)
meds are updated in computer

## 2012-08-23 NOTE — Telephone Encounter (Signed)
I was able to speak with pt's caregiver, [name], who says BP have been as follows: 3/25 0900 before meds=220/69 1030 after meds=101/47 (asymptomatic) 1400=188/74 2320=177/62  I will send readings to Dr. Mariah Milling and call if we want to make any changes

## 2012-08-23 NOTE — Telephone Encounter (Signed)
I attempted [employer]	[place]. Pt has been d/c

## 2012-08-25 NOTE — Telephone Encounter (Signed)
We will probably need to treat high blood pressure with short acting medication when necessary such as hydralazine 50 mg,  or isosorbide dinitrate 40 mg short acting dose

## 2012-08-28 NOTE — Telephone Encounter (Signed)
It appears he is already on both of these meds you have mentioned Do you want them to continue to monitor?

## 2012-08-29 NOTE — Telephone Encounter (Signed)
Would continue to monitor blood pressure, take extra medication as needed

## 2012-08-30 ENCOUNTER — Ambulatory Visit (INDEPENDENT_AMBULATORY_CARE_PROVIDER_SITE_OTHER): Payer: Medicare Other

## 2012-08-30 DIAGNOSIS — Z7901 Long term (current) use of anticoagulants: Secondary | ICD-10-CM

## 2012-08-30 DIAGNOSIS — I4891 Unspecified atrial fibrillation: Secondary | ICD-10-CM

## 2012-08-30 DIAGNOSIS — I6350 Cerebral infarction due to unspecified occlusion or stenosis of unspecified cerebral artery: Secondary | ICD-10-CM

## 2012-08-30 DIAGNOSIS — I639 Cerebral infarction, unspecified: Secondary | ICD-10-CM

## 2012-08-30 LAB — POCT INR: INR: 3.2

## 2012-09-07 ENCOUNTER — Telehealth: Payer: Self-pay

## 2012-09-07 ENCOUNTER — Telehealth: Payer: Self-pay | Admitting: Internal Medicine

## 2012-09-07 NOTE — Telephone Encounter (Signed)
Please advise 

## 2012-09-07 NOTE — Telephone Encounter (Signed)
Pt caregiver called and states is currently seeing Dr. Cherylann Ratel for his nephrologist. He would like to change to Dr. Ellis Savage, and wanted to know if you had a working relationship with him, and your opinion of him.

## 2012-09-07 NOTE — Telephone Encounter (Signed)
[name] called and stated that mr Loomis was changing his urology dr   From dr Vick Frees to dr Ellis Savage ([address]).  [name] wanted to make sure this was ok with you and that you and dr Ellis Savage work together Please advise

## 2012-09-07 NOTE — Telephone Encounter (Signed)
Absolutely. No problem.

## 2012-09-07 NOTE — Telephone Encounter (Signed)
Tried to call pt no answer

## 2012-09-08 NOTE — Telephone Encounter (Signed)
I think it is Media [employer]. Has not spent much time talking with her and not familiar with [facility] renal system.

## 2012-09-11 NOTE — Telephone Encounter (Signed)
I was able to speak with [name] who says she recently discovered that Dr. Ellis Savage has retired They are now going to see Dr. Idelle Jo in [place]  [name] also asks if we can dictate a letter for the courts. Says pt rec'd letter in mail from his wife's attorney asking for mediation in court 09/26/12 [name] is concerned about pt's elevated BP and how high it may go in court for this and would like a letter excusing pt from this She will drop off papers for Dr. Mariah Milling

## 2012-09-12 NOTE — Telephone Encounter (Signed)
Talked with [name] Mr. Hedstrom's daughter she sated Mr. Hoey has urology appointment with Dr. Enrique Sack next Thursday.

## 2012-09-18 ENCOUNTER — Telehealth: Payer: Self-pay

## 2012-09-18 NOTE — Telephone Encounter (Signed)
Please see below and advise. thanks 

## 2012-09-18 NOTE — Telephone Encounter (Signed)
No answer

## 2012-09-18 NOTE — Telephone Encounter (Signed)
[name], pt's caregiver, called. She says pt's BP continues to run high (SBP>200) most of the time She confirms compliance with medications as we have prescribed including hydralazine QID, SL NTG PRN (used this 4x yesterday), imdur, etc Caregiver states pt gets so "worried that he is going to have another stroke". I explained this worrying alone will cause his BP to go up I asked if he takes anything for anxiety. She says she gives him a Xanax at times and this helps lower the BP  He went to [facility]. No new meds were added and MD told caregiver/pt that the only way to find out what is going on is to hospitalize him and observe. This was not arranged  Caregiver is expressing some caregiver role strain as well. She voices frustration of having this responsibility of managing his BP meds so diligently and monitoring his BPs so often, etc.  She voices fear of her having declining health because of this stress as well.  Family is reluctant to place him in a nursing home.  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  Caregiver asks what can be done about his BP. Has appt with [place] 4/28 but wants to be seen sooner to discuss. Made new appt with Dr. Mariah Milling for 4/24 at 1015. Will cancel 4/28 appt. Caregiver verb. Understanding.  In the mean time, I will discuss with Dr. Mariah Milling to see if there are any other med changes we need to make prior to appt.

## 2012-09-18 NOTE — Telephone Encounter (Signed)
Pt caregiver states pt BP is running high 198/63 yesterday was over 200 states has been doing this the last 2 weeks and would like a call back

## 2012-09-19 ENCOUNTER — Telehealth: Payer: Self-pay | Admitting: *Deleted

## 2012-09-19 ENCOUNTER — Other Ambulatory Visit: Payer: Self-pay | Admitting: *Deleted

## 2012-09-19 MED ORDER — WARFARIN SODIUM 6 MG PO TABS: ORAL_TABLET | ORAL | Status: DC

## 2012-09-19 NOTE — Telephone Encounter (Signed)
[name] called stating that patient's BP has been going up and down from 200 to 120 in a very short period of time. Patient saw a nephrologist Dr. Roseanne Reno last week and he feels that patient needs to be admitted to the hospital to get his BP regulated. [name] wants to know if this is something that Dr. Darrick Huntsman will do? Please advise.

## 2012-09-19 NOTE — Telephone Encounter (Signed)
Carollee Herter not sure but I  Think This was to come back to you.

## 2012-09-19 NOTE — Telephone Encounter (Signed)
Patient notified

## 2012-09-19 NOTE — Telephone Encounter (Signed)
No, I do not admit patients to the hospital anymore.  The hospitalists do my admissions.

## 2012-09-20 ENCOUNTER — Ambulatory Visit (INDEPENDENT_AMBULATORY_CARE_PROVIDER_SITE_OTHER): Payer: Medicare Other

## 2012-09-20 ENCOUNTER — Other Ambulatory Visit: Payer: Self-pay | Admitting: Internal Medicine

## 2012-09-20 ENCOUNTER — Telehealth: Payer: Self-pay | Admitting: Internal Medicine

## 2012-09-20 ENCOUNTER — Encounter: Payer: Self-pay | Admitting: Internal Medicine

## 2012-09-20 ENCOUNTER — Other Ambulatory Visit (INDEPENDENT_AMBULATORY_CARE_PROVIDER_SITE_OTHER): Payer: Medicare Other

## 2012-09-20 DIAGNOSIS — I639 Cerebral infarction, unspecified: Secondary | ICD-10-CM

## 2012-09-20 DIAGNOSIS — Z7409 Other reduced mobility: Secondary | ICD-10-CM

## 2012-09-20 DIAGNOSIS — Z7901 Long term (current) use of anticoagulants: Secondary | ICD-10-CM

## 2012-09-20 DIAGNOSIS — I4891 Unspecified atrial fibrillation: Secondary | ICD-10-CM

## 2012-09-20 DIAGNOSIS — I6350 Cerebral infarction due to unspecified occlusion or stenosis of unspecified cerebral artery: Secondary | ICD-10-CM

## 2012-09-20 DIAGNOSIS — N179 Acute kidney failure, unspecified: Secondary | ICD-10-CM

## 2012-09-20 LAB — BASIC METABOLIC PANEL
GFR: 24.6 mL/min — ABNORMAL LOW (ref >60.00)
Potassium: 4.1 mEq/L (ref 3.5–5.1)
Sodium: 140 mEq/L (ref 135–145)

## 2012-09-20 LAB — POCT INR: INR: 3.5

## 2012-09-20 NOTE — Telephone Encounter (Signed)
Needs today's lab results faxed to [facility], Enrique Sack, MD Fx [phone]

## 2012-09-20 NOTE — Telephone Encounter (Signed)
Pt would like today labs sent to [facility] Fax number: [phone]

## 2012-09-20 NOTE — Telephone Encounter (Signed)
When labs are received will fax to [place]

## 2012-09-21 ENCOUNTER — Telehealth: Payer: Self-pay | Admitting: *Deleted

## 2012-09-21 ENCOUNTER — Ambulatory Visit (INDEPENDENT_AMBULATORY_CARE_PROVIDER_SITE_OTHER): Payer: Medicare Other | Admitting: Cardiovascular Disease

## 2012-09-21 ENCOUNTER — Encounter: Payer: Self-pay | Admitting: Cardiovascular Disease

## 2012-09-21 VITALS — BP 142/62 | HR 73 | Ht 72.0 in | Wt 219.5 lb

## 2012-09-21 DIAGNOSIS — I350 Nonrheumatic aortic (valve) stenosis: Secondary | ICD-10-CM

## 2012-09-21 DIAGNOSIS — R0602 Shortness of breath: Secondary | ICD-10-CM

## 2012-09-21 DIAGNOSIS — I10 Essential (primary) hypertension: Secondary | ICD-10-CM

## 2012-09-21 DIAGNOSIS — I4891 Unspecified atrial fibrillation: Secondary | ICD-10-CM

## 2012-09-21 DIAGNOSIS — F419 Anxiety disorder, unspecified: Secondary | ICD-10-CM

## 2012-09-21 DIAGNOSIS — I701 Atherosclerosis of renal artery: Secondary | ICD-10-CM

## 2012-09-21 DIAGNOSIS — F411 Generalized anxiety disorder: Secondary | ICD-10-CM

## 2012-09-21 DIAGNOSIS — I359 Nonrheumatic aortic valve disorder, unspecified: Secondary | ICD-10-CM

## 2012-09-21 MED ORDER — FELODIPINE ER 10 MG PO TB24: 10.0000 mg | ORAL_TABLET | Freq: Every day | ORAL | Status: DC

## 2012-09-21 MED ORDER — DOXAZOSIN MESYLATE 8 MG PO TABS: 8.0000 mg | ORAL_TABLET | Freq: Two times a day (BID) | ORAL | Status: DC

## 2012-09-21 MED ORDER — CLONIDINE HCL 0.2 MG PO TABS: 0.2000 mg | ORAL_TABLET | Freq: Two times a day (BID) | ORAL | Status: DC

## 2012-09-21 MED ORDER — LABETALOL HCL 200 MG PO TABS: 200.0000 mg | ORAL_TABLET | Freq: Two times a day (BID) | ORAL | Status: DC

## 2012-09-21 MED ORDER — FUROSEMIDE 40 MG PO TABS: 40.0000 mg | ORAL_TABLET | Freq: Every day | ORAL | Status: AC | PRN

## 2012-09-21 NOTE — Assessment & Plan Note (Addendum)
Mild to moderate aortic valve stenosis. He would likely need Lasix twice a week, possibly 3 times a week given his stenosis, especially in the setting of atrial fibrillation

## 2012-09-21 NOTE — Assessment & Plan Note (Signed)
Family reports that Xanax is not working for him. He may need alternate medication. Will defer to Dr. Darrick Huntsman. Significant upcoming stressors with core proceedings, settling matters with his ex-wife.

## 2012-09-21 NOTE — Telephone Encounter (Signed)
Please advise 

## 2012-09-21 NOTE — Progress Notes (Signed)
Patient ID: Scott Norman, male    DOB: 07/14/1930, 79 y.o.   MRN: [id number]  HPI Comments: 79 yo with history of paroxysmal atrial fibrillation, CKD,  mild to moderate aortic stenosis,  hospitalization for large cardioembolic stroke ([place] in 5/12 with a left MCA cardioembolic stroke),   treated with tPA, noted to be in atrial fibrillation  while in the hospital with residual profound expressive aphasia, now on coumadin,  with weight gain,  edema and abdominal swelling in 2012 , Severe labile hypertension, found to have renal artery stenosis,  status post stent placement. He presents for routine followup. On previous office visits, he has had worsening edema, ulcers on his right lower extremity requiring a wrap. Improvement by holding calcium channel blockers Underlying renal dysfunction with baseline creatinine in the 2s, followed by [facility] renal.  He is very agitated today. Family reports that he has been very difficult to manage secondary to his aggression, outbursts. New stressors include possible core proceedings and settlement with his ex-wife. He is currently taking Cardura, hydralazine, isosorbide and blood pressure has been labile and poorly controlled. Nurse from [facility] "scared him" per the family talking about stroke in detail this past week. He is very concerned about having a stroke from his blood pressure, additional agitation is not relieved by Xanax and blood pressures continue to trend higher as he gets more agitated.  No significant lower extremity edema on his current medication regimen. He is taking Lasix rarely. No significant shortness of breath. Otherwise active and balance is adequate.  EKG shows normal sinus rhythm with junctional escape rhythm    Outpatient Encounter Prescriptions as of 09/21/2012  Medication Sig Dispense Refill  . ALPRAZolam (XANAX) 0.5 MG tablet Take 1 tablet (0.5 mg total) by mouth 3 (three) times daily as needed for anxiety.  30 tablet  3  .  buPROPion (WELLBUTRIN SR) 150 MG 12 hr tablet Take 1 tablet (150 mg total) by mouth 2 (two) times daily.  60 tablet  3  . cetirizine (ZYRTEC) 10 MG tablet Take 10 mg by mouth daily as needed for allergies.      . cholecalciferol (VITAMIN D) 1000 UNITS tablet Take 2,000 Units by mouth daily.      . diazepam (VALIUM) 5 MG tablet Take 5 mg by mouth at bedtime.       Marland Kitchen doxazosin (CARDURA) 8 MG tablet Take 1 tablet (8 mg total) by mouth 2 (two) times daily. Take 4 mg in the am and 8 mg in the pm.  60 tablet  6  . fenofibrate (TRICOR) 145 MG tablet Take 1 tablet (145 mg total) by mouth daily.  30 tablet  6  . hydrALAZINE (APRESOLINE) 100 MG tablet Take 1.5 tablets (150 mg total) by mouth 4 (four) times daily.  180 tablet  6  . HYDROcodone-acetaminophen (NORCO) 10-325 MG per tablet Take 1 tablet by mouth daily as needed for pain.       . isosorbide mononitrate (IMDUR) 60 MG 24 hr tablet Take 60 mg by mouth 2 (two) times daily. Take 1 tablet at bed time.  If hypertension in am then take one as needed in am      . ketoconazole (NIZORAL) 2 % cream Apply 1 application topically daily as needed (rash).       . nitroGLYCERIN (NITROSTAT) 0.4 MG SL tablet Place 0.4 mg under the tongue every 5 (five) minutes as needed. For chest pain      . nystatin (  MYCOSTATIN) powder Apply topically 4 (four) times daily.  15 g  0  . oxyCODONE-acetaminophen (PERCOCET) 10-325 MG per tablet Take 1 tablet by mouth every 4 (four) hours as needed for pain.  30 tablet  0  . polyethylene glycol (MIRALAX / GLYCOLAX) packet Take 17 g by mouth daily as needed (constipation).       . warfarin (COUMADIN) 6 MG tablet Take as directed by coumadin clinic  30 tablet  2  . furosemide (LASIX) 40 MG tablet Take 1 tablet (40 mg total) by mouth daily as needed.  30 tablet  3    Review of Systems  Constitutional: Negative.   HENT: Negative.   Eyes: Negative.   Respiratory: Negative.   Gastrointestinal: Negative.   Musculoskeletal: Positive for  gait problem.  Skin: Negative.   Neurological: Negative.        Speech difficulty  Psychiatric/Behavioral: The patient is nervous/anxious.   All other systems reviewed and are negative.   BP 142/62  Pulse 73  Ht 6' (1.829 m)  Wt 219 lb 8 oz (99.565 kg)  BMI 29.76 kg/m2  Physical Exam  Nursing note and vitals reviewed. Constitutional: He is oriented to person, place, and time. He appears well-developed and well-nourished.  Speech impediment from prior stroke  HENT:  Head: Normocephalic.  Nose: Nose normal.  Mouth/Throat: Oropharynx is clear and moist.  Eyes: Conjunctivae are normal. Pupils are equal, round, and reactive to light.  Neck: Normal range of motion. Neck supple. No JVD present. Carotid bruit is present.  Cardiovascular: Normal rate, S1 normal, S2 normal and intact distal pulses.  An irregularly irregular rhythm present.  Occasional extrasystoles are present. Exam reveals no gallop and no friction rub.   Murmur heard.  Crescendo systolic murmur is present with a grade of 2/6  Pulmonary/Chest: Effort normal and breath sounds normal. No respiratory distress. He has no wheezes. He has no rales. He exhibits no tenderness.  Abdominal: Soft. Bowel sounds are normal. He exhibits no distension. There is no tenderness.  Musculoskeletal: Normal range of motion. He exhibits no edema and no tenderness.  Lymphadenopathy:    He has no cervical adenopathy.  Neurological: He is alert and oriented to person, place, and time. Coordination normal.  Skin: Skin is warm and dry. No rash noted. No erythema.  Psychiatric: He has a normal mood and affect. His behavior is normal. Judgment and thought content normal.      Assessment and Plan

## 2012-09-21 NOTE — Patient Instructions (Addendum)
Please restart clonidine 1/2 twice a day for a week Start labetolol 1/2 pill three times a day  If blood pressure continues to run high,  Increase the clonidine to a full pill twice a day  Consider taking lasix twice a week  Please call us if you have new issues that need to be addressed before your next appt.  Your physician wants you to follow-up in: 1 months.

## 2012-09-21 NOTE — Telephone Encounter (Signed)
Message copied by [facility], Ingeborg Fite E on Thu Sep 21, 2012  5:09 PM ------      Message from: Antonieta Iba      Created: Thu Sep 21, 2012  4:03 PM       Yes, ok to fax.      Until be get BP under control      This may explain things                  ----- Message -----         From: Marcelle Overlie, RN         Sent: 09/21/2012   2:41 PM           To: Antonieta Iba, MD            Question: pt is scheduled to participate in a mediation with lawyers/soon-to-be ex wife      Family feels he cannot sit through something like this d/t high blood pressures and the fact that he gets so upset/angry so easily      They have given me lawyer's names/fax #      Can I fax a letter to excuse him from this?       ------

## 2012-09-21 NOTE — Assessment & Plan Note (Signed)
Recent ultrasound showing patent stent, no significant renal artery stenosis.

## 2012-09-21 NOTE — Telephone Encounter (Signed)
Pt daughter called wanting to know if pt would qualify for Hospice. Would like to speak to nurse concerning this.

## 2012-09-21 NOTE — Telephone Encounter (Signed)
Awaiting fax # from other lawyer then will fax

## 2012-09-21 NOTE — Assessment & Plan Note (Signed)
Blood pressure is a major issue on today's visit, complicated by his agitation and underlying anxiety, possibly from upcoming court proceedings with ex-wife. We will restart clonidine 0.1 mg twice a day, he woke up he 0.2 mg in half. Also will start labetalol 100 mg 3 times a day, cut the 200 mg pills in half. For severe hypertension, he began could continue sublingual nitroglycerin, could consider extra clonidine, possibly labetalol if heart rate does not decrease to low.

## 2012-09-22 ENCOUNTER — Encounter: Payer: Self-pay | Admitting: Internal Medicine

## 2012-09-22 ENCOUNTER — Other Ambulatory Visit: Payer: Self-pay | Admitting: Internal Medicine

## 2012-09-22 MED ORDER — PAROXETINE HCL ER 25 MG PO TB24: 25.0000 mg | ORAL_TABLET | ORAL | Status: AC

## 2012-09-22 MED ORDER — ALPRAZOLAM 0.5 MG PO TABS: 1.0000 mg | ORAL_TABLET | Freq: Three times a day (TID) | ORAL | Status: DC | PRN

## 2012-09-22 NOTE — Progress Notes (Signed)
Patient notified

## 2012-09-25 ENCOUNTER — Encounter: Payer: Self-pay | Admitting: Internal Medicine

## 2012-09-25 ENCOUNTER — Ambulatory Visit: Payer: Medicare Other | Admitting: Cardiovascular Disease

## 2012-09-25 NOTE — Telephone Encounter (Signed)
Faxed to fax # I had available

## 2012-09-25 NOTE — Telephone Encounter (Signed)
Attempted to call pt's wife's attorney x 2 to get fax # Keeps going to VM Will fax to pt's attorney since I have that fax #: Wife's Merri Ray [phone], fax #-u/k His attorney-lawson brown [phone], fax [phone]

## 2012-09-26 ENCOUNTER — Telehealth: Payer: Self-pay | Admitting: *Deleted

## 2012-09-26 NOTE — Telephone Encounter (Signed)
Called patient daughter left message to call office.

## 2012-09-26 NOTE — Telephone Encounter (Signed)
Called patient to verify message and patient stated she had received email message and is going to have the caregiver for her father schedule a follow-up visit to discuss possible Hospice referral.

## 2012-09-26 NOTE — Telephone Encounter (Signed)
Message copied by Dennie Bible on Tue Sep 26, 2012 10:45 AM ------      Message from: Sherlene Shams      Created: Fri Sep 22, 2012  1:29 PM      Regarding: Father's anxiety             Please call his daughter [name] with the following message.  I spoke to Dr. Mariah Milling and agree with him. I  think we need to try to get your father's anxiety under bettter control and this will help his blood pressure.  I would like to increase his alprazolam dose and start him on an SSRI .  I recommend increasing his alprazolam to 1 mg every 8 hours as needed ,  And lexapro once daily, starting with 1/2 tablet for the first 3 or 4 days.              I will send prescriptions to his pharmacy .            Dr. Darrick Huntsman                               ----- Message -----         From: Antonieta Iba, MD         Sent: 09/21/2012   4:03 PM           To: Sherlene Shams, MD            I evaluated Mr. Alfred today.      Anxiety, depression, agitation is a major issue now per the family.      Suspect possible court proceedings may be exacerbating things.      Visiting nurse telling him that he is going to have a stroke also does not help.            Blood pressures have been through the roof at times and I am restarting clonidine twice a day, also low-dose labetalol.      He was very upset in clinic today worried about a stroke, worst I have seen him.             Family reports low-dose Xanax is not helping      They wonder if he needs a bigger dose, higher than 0.5 mg            I wonder if he needs something else such as Paxil or other alternative.      Would defer to you as I am less familiar with these medications.            I'm going to write a letter to get him out of court proceedings at this time until he is better controlled, in terms of blood pressure and possibly anxiety.            thx      tim                              ----- Message -----         From: Marcelle Overlie, RN         Sent:  09/21/2012   2:41 PM           To: Antonieta Iba, MD            Question: pt is scheduled to participate in  a mediation with lawyers/soon-to-be ex wife      Family feels he cannot sit through something like this d/t high blood pressures and the fact that he gets so upset/angry so easily      They have given me lawyer's names/fax #      Can I fax a letter to excuse him from this?             ------

## 2012-09-27 ENCOUNTER — Telehealth: Payer: Self-pay | Admitting: Internal Medicine

## 2012-09-27 ENCOUNTER — Ambulatory Visit (INDEPENDENT_AMBULATORY_CARE_PROVIDER_SITE_OTHER): Payer: Medicare Other | Admitting: Adult Health

## 2012-09-27 ENCOUNTER — Encounter: Payer: Self-pay | Admitting: Adult Health

## 2012-09-27 ENCOUNTER — Telehealth: Payer: Self-pay

## 2012-09-27 VITALS — BP 148/58 | HR 56 | Temp 99.4°F | Resp 14

## 2012-09-27 DIAGNOSIS — R0602 Shortness of breath: Secondary | ICD-10-CM

## 2012-09-27 DIAGNOSIS — R531 Weakness: Secondary | ICD-10-CM | POA: Insufficient documentation

## 2012-09-27 DIAGNOSIS — R5381 Other malaise: Secondary | ICD-10-CM

## 2012-09-27 DIAGNOSIS — R5383 Other fatigue: Secondary | ICD-10-CM

## 2012-09-27 MED ORDER — CIPROFLOXACIN HCL 500 MG PO TABS: 500.0000 mg | ORAL_TABLET | Freq: Two times a day (BID) | ORAL | Status: DC

## 2012-09-27 NOTE — Telephone Encounter (Signed)
Pt seeing Raquel [place] today Is he a candidate for Hospice or is this left up to PCP? (see below)

## 2012-09-27 NOTE — Telephone Encounter (Signed)
[facility] calling, per Dr. Mariah Milling pt needs to be seen today by anyone.  Raquel has 4:15 available but only 15 minute slot.  Can we schedule this pt?  No other openings.  Please notify pt caregiver, Sedalia Muta [phone].

## 2012-09-27 NOTE — Telephone Encounter (Signed)
Appt ok per Raquel.  Spoke with [name], caregiver.  Pt will see Raquel at 4:15 4/30.

## 2012-09-27 NOTE — Telephone Encounter (Signed)
Would hold labetalol if heart rate is too low His blood pressure better on clonidine twice a day? Today think the benzodiazepines are causing excess sedation? This could be used as needed only for anxiety

## 2012-09-27 NOTE — Telephone Encounter (Signed)
I spoke with [name], pt's caregiver, who says pt is declining. Says since medication changes at last OV, BP continues to fluctuate from 83/50-207/90 Pt is very weak and has no appetite Is refusing to take a shower x 3 days, which caregiver says "is not like him" Continues to have [facility] nurse through [facility]. This nurse suggests they call PCP They called PCP (Dr. Melina Schools ofice) and [facility] and have not gotten a return call I had [name] hold while I called their office to see about him being worked in there  Per receptionist at Dr. Melina Schools office, nurses are at lunch and they need to advise where they can work pt in today Receptionist has [name]'s number and will call her.   I explained this to [name] who says dtr may take pt to ER if they cannot get appt today Pt is asleep in recliner, easily aroused., BP right now is 12850, HR=53 [name] has been holding labetalol d/t low HR 40-50 BPM since starting this medication  They will await call from Tullo's office and will call me back if she does not get a response  She also asks if pt is a candidate for Hospice care I told pt I would discuss with Dr. Mariah Milling and let her know

## 2012-09-27 NOTE — Telephone Encounter (Signed)
Fwd Scott Norman

## 2012-09-27 NOTE — Telephone Encounter (Signed)
Pt caregiver called (diane) States Dr. Mariah Milling changed his medication last week for his BP, states pt appetite is gone, he wants to sleep all the time, and very weak, since he started taking this medication, His BP yesterday is 83/43 HR 42. States he has started using his walker, where he used to use a 4- prong cane.States they are waiting for a call back from Dr Darrick Huntsman office today to do labwork. Please call.

## 2012-09-27 NOTE — Progress Notes (Signed)
Subjective:    Patient ID: Scott Norman, male    DOB: [birth year], 79 y.o.   MRN: [id number]  HPI  Scott Norman is a pleasant 79 year old gentleman with multiple medical problems including CVA, hypertension, hyperlipidemia, diabetes, renal artery stenosis, aortic stenosis, A. fib, diastolic heart failure, CKD stage IV followed by Dr. Enrique Sack.  Pt presents to clinic with fatigue and weakness. He also reports shortness of breath with exertion. Patient reports he is not feeling well for several weeks. He saw Dr. Mariah Milling last week with changes made to his medications. He has gone from ambulating with a cane to using a wheelchair within 2 days. Increase amounts of time spent sleeping. Anorexia. He presents to clinic with his caregiver and his daughter, [name].   Current Outpatient Prescriptions on File Prior to Visit  Medication Sig Dispense Refill  . buPROPion (WELLBUTRIN SR) 150 MG 12 hr tablet Take 1 tablet (150 mg total) by mouth 2 (two) times daily.  60 tablet  3  . cetirizine (ZYRTEC) 10 MG tablet Take 10 mg by mouth daily as needed for allergies.      . cholecalciferol (VITAMIN D) 1000 UNITS tablet Take 2,000 Units by mouth daily.      . diazepam (VALIUM) 5 MG tablet Take 5 mg by mouth at bedtime.       . fenofibrate (TRICOR) 145 MG tablet Take 1 tablet (145 mg total) by mouth daily.  30 tablet  6  . hydrALAZINE (APRESOLINE) 100 MG tablet Take 1.5 tablets (150 mg total) by mouth 4 (four) times daily.  180 tablet  6  . HYDROcodone-acetaminophen (NORCO) 10-325 MG per tablet Take 1 tablet by mouth daily as needed for pain.       . isosorbide mononitrate (IMDUR) 60 MG 24 hr tablet Take 60 mg by mouth 2 (two) times daily. Take 1 tablet at bed time.  If hypertension in am then take one as needed in am      . nitroGLYCERIN (NITROSTAT) 0.4 MG SL tablet Place 0.4 mg under the tongue every 5 (five) minutes as needed. For chest pain      . nystatin (MYCOSTATIN) powder Apply topically 4 (four) times  daily.  15 g  0  . PARoxetine (PAXIL-CR) 25 MG 24 hr tablet Take 1 tablet (25 mg total) by mouth every morning.  30 tablet  3  . polyethylene glycol (MIRALAX / GLYCOLAX) packet Take 17 g by mouth daily as needed (constipation).       . warfarin (COUMADIN) 6 MG tablet Take as directed by coumadin clinic  30 tablet  2  . furosemide (LASIX) 40 MG tablet Take 1 tablet (40 mg total) by mouth daily as needed.  30 tablet  3  . ketoconazole (NIZORAL) 2 % cream Apply 1 application topically daily as needed (rash).        No current facility-administered medications on file prior to visit.     Review of Systems  Constitutional: Positive for fever, chills, activity change, appetite change and fatigue.  HENT: Positive for rhinorrhea. Negative for sore throat.   Respiratory: Positive for shortness of breath. Negative for cough and wheezing.        Shortness of breath with exertion  Cardiovascular: Positive for leg swelling. Negative for chest pain.  Gastrointestinal: Negative for nausea, vomiting and abdominal pain.       Loose stools  Genitourinary: Negative for dysuria, urgency, frequency and difficulty urinating.  Allergic/Immunologic: Positive for environmental allergies.  Neurological: Positive  for weakness.  Psychiatric/Behavioral: Negative for behavioral problems and agitation. The patient is nervous/anxious.     BP 148/58  Pulse 56  Temp(Src) 99.4 F (37.4 C) (Oral)  Resp 14  SpO2 96%    Objective:   Physical Exam  Constitutional:  Chronically ill appearing, frail elderly gentleman  Cardiovascular: Bradycardia present.   Murmur heard.  Systolic murmur is present  Pulmonary/Chest: Breath sounds normal. Not tachypneic and not bradypneic. No respiratory distress. He has no decreased breath sounds. He has no wheezes. He has no rhonchi. He has no rales.  Abdominal: Soft. Bowel sounds are normal.  Musculoskeletal: He exhibits edema.  Lymphadenopathy:    He has no cervical adenopathy.   Neurological: He is alert.  Expressive aphasia  Skin: Skin is warm and dry.  Psychiatric: He has a normal mood and affect. His behavior is normal.       Assessment & Plan:

## 2012-09-27 NOTE — Assessment & Plan Note (Addendum)
Suspect etiology multifactorial. Patient has been started on new cardiac medications which could be contributing to his weakness and fatigue. Patient is bradycardic. Has not taken labetalol. Also, noted having a low-grade temp. Check CBC, cmet, BNP secondary to history of heart failure and complaints of shortness of breath with exertion. ? UTI. Pt unable to provide urine sample. Start Cipro 500 mg twice a day x14 days. Creatinine clearance calculated based on most recent creatinine level. Creatinine clearance 30.8 - Cipro dosed based on this creatinine clearance. Check PT/INR Friday morning. Spoke with [name], [facility] Nurse and gave verbal order to draw. Note, greater than 45 minutes were spent in face-to-face communication with patient, caregiver and patient's daughter in the discussion and planning patient's care.  Long discussion with patient's daughter, Scott Norman, regarding the possibility that her father may be approaching the end of life. We discussed hospice services in the patient's home. Given his multiple comorbidities patient would definitely qualify for the services. We will explore if the etiology is something that can be easily reversed; however, I would still recommend the family and patient consider hospice as an option. Scott Norman was very interested in this service for her father.

## 2012-09-27 NOTE — Telephone Encounter (Signed)
Patient Information:  Caller Name: [name] Davidson/Caregiver  Phone: [phone]  Patient: Scott Norman, Scott Norman  Gender: Male  DOB: Dec 12, 1930  Age: 79 Years  PCP: Duncan Dull (Adults only)  Office Follow Up:  Does the office need to follow up with this patient?: Yes  Instructions For The Office: Please contact Caregiver regarding having patient seen and evaluated. No appt available now for evaluation.  PLEASE CONTACT  RN Note:  Care giver wants patient seen in the office instead of the ER .  Symptoms  Reason For Call & Symptoms: Caregiver is calling regarding Mr. Dasch . She states she has talked the Home Health Nurse this morning and was advised to have patient evaluated with lab work.  She reports that Mr. Glassburn medication was changed last week by his cardiologist. (clonidine and Labetalol)  He is so week that he is unable to get out of the chair, he was using a walking stick and is now using walker. Not eating. +weakness.  Feels like he might fall.  B/P 181/68 and H/R 65 prior to medication. Yesterday B/P dropped to  82/39 and HR 46) Daughter wants him to go to the ER. Patient does not want to go to the ER.  Reviewed Health History In EMR: Yes  Reviewed Medications In EMR: Yes  Reviewed Allergies In EMR: Yes  Reviewed Surgeries / Procedures: Yes  Date of Onset of Symptoms: 09/21/2012  Guideline(s) Used:  Weakness (Generalized) and Fatigue  Disposition Per Guideline:   Go to Office Now  Reason For Disposition Reached:   Moderate weakness (i.e., interferes with work, school, normal activities) and cause unknown  Advice Given:  Call Back If:  Unable to stand or walk  Passes out  Breathing difficulty occurs  You become worse.  Patient Will Follow Care Advice:  YES

## 2012-09-27 NOTE — Patient Instructions (Addendum)
  Start Cipro 500 mg every 12 hours for the next 14 days. You will need a PT/INR drawn Friday morning. [facility] nurse will draw this lab. Adjust Coumadin based on level.  Please have labs drawn prior to leaving the office.  If able to provide a urine sample prior to starting antibiotics, please bring sample by the office.

## 2012-09-28 LAB — COMPREHENSIVE METABOLIC PANEL
ALT: 14 U/L (ref 0–53)
CO2: 21 mEq/L (ref 19–32)
Calcium: 9 mg/dL (ref 8.4–10.5)
Chloride: 106 mEq/L (ref 96–112)
Creatinine, Ser: 2.5 mg/dL — ABNORMAL HIGH (ref 0.4–1.5)
GFR: 26.06 mL/min — ABNORMAL LOW (ref >60.00)

## 2012-09-28 LAB — CBC WITH DIFFERENTIAL/PLATELET
Basophils Absolute: 0.1 10*3/uL (ref 0.0–0.1)
Basophils Relative: 1.2 % (ref 0.0–3.0)
Hemoglobin: 11.3 g/dL — ABNORMAL LOW (ref 13.0–17.0)
Lymphocytes Relative: 11.8 % — ABNORMAL LOW (ref 12.0–46.0)
Monocytes Relative: 7 % (ref 3.0–12.0)
Neutro Abs: 9.6 10*3/uL — ABNORMAL HIGH (ref 1.4–7.7)
RBC: 3.83 Mil/uL — ABNORMAL LOW (ref 4.22–5.81)
RDW: 15.3 % — ABNORMAL HIGH (ref 11.5–14.6)
WBC: 12.2 10*3/uL — ABNORMAL HIGH (ref 4.5–10.5)

## 2012-09-28 NOTE — Telephone Encounter (Signed)
See Raquel's note from 4/30

## 2012-09-29 NOTE — Telephone Encounter (Signed)
I discussed with [name] who agrees with thoughts [id number]. Was started on Paxil 2 days ago.  She agrees with unable to qualify for Hospice and asks if I can call dtr to suggest assisted living I will call dtr at work

## 2012-09-29 NOTE — Telephone Encounter (Signed)
Can't tell from note if only change to BP meds was holding labetolol (which was not started) Lower heart rate could be secondary to clonidine. Not too slow as long as it stays in mid to high 50s, 60s. Would continue to monitor BP Anxiety meds helping with BP?

## 2012-09-29 NOTE — Telephone Encounter (Signed)
I discussed with Dr. Mariah Milling who advised to hold lebetalol altogether to see if this helps pt's symptoms. He does not feel pt needs to be seen today He was made aware of elevated BNP and edma, but feels the edema is chronic and is not a new issue, should continue to elevate LE and wear TED hose for support He feels pt's symptoms of fatigue, weakness and decreased appetite are coming form anxiety/depression versus BP/cardiac issues He does not feel he is a candidate for Hospice but does feel strongly he needs assisted living services I will call [name] to explain.

## 2012-09-29 NOTE — Telephone Encounter (Signed)
I received t/c from [facility] nurse, who is at pt home She is concerned about pt and asks if he could be seen today She says pt is extremely weak, fatigued, sleeping all the time and continues to have excessive edema in LE  SBP this am=170, HR=59 BPM Was started on Cipro 2 days ago by PCP for infection of unknown cause (unable to get U/A, WBC=12) Concerned b/c BNP elevated  [name], caregiver, is at home as well and confirms she is giving pt medications as follows: labetalol (unless HR<50), felodipine, clonidine 1/2 tablet BID, as well as other medications  I explained Dr. Mariah Milling does not appear to have any openings today but I will talk with him when he come in for clinic and will call [name] back Understanding verb

## 2012-10-02 ENCOUNTER — Telehealth: Payer: Self-pay | Admitting: Adult Health

## 2012-10-02 NOTE — Telephone Encounter (Signed)
Labs have been faxed.

## 2012-10-02 NOTE — Telephone Encounter (Signed)
RR,  Pts daughter called and stated the patient is feeling better. She was inquiring about a hospice referral. Have you spoke with anyone else in regards to this? I know you had talked to [facility] about home health, and [name] stated he had 10 visits approved. Please advise.

## 2012-10-03 ENCOUNTER — Telehealth: Payer: Self-pay

## 2012-10-03 NOTE — Telephone Encounter (Signed)
Please tell [name] from [facility] that his Coumadin level is therapeutic,  Continue current regimen and repeat PT/INR in one month./

## 2012-10-03 NOTE — Telephone Encounter (Signed)
[name] from [employer] called about the patient PT/INR levels. PT  21.8 and INR 2.2. [name] wanted to know do you want her to recheck his PT/INR in a couple of weeks or change his coumadin dosage.

## 2012-10-03 NOTE — Telephone Encounter (Signed)
Notified Scott Norman that his coumadin level is therapeutic, continue current regimen and repeat PT/INR in one month

## 2012-10-03 NOTE — Telephone Encounter (Signed)
I will forward this to Lupita Leash so she can f/u on this, as she is doing referrals this week

## 2012-10-03 NOTE — Telephone Encounter (Signed)
I made the referral to [facility] that very day I spoke with daughter. I spoke to the on-call nurse, Selena Batten. Can you follow up on this? [phone]  Kim told me he was already in their system because he was a pt of [facility]. They should have contacted the daughter already. Please let me know.

## 2012-10-04 ENCOUNTER — Other Ambulatory Visit: Payer: Self-pay | Admitting: Adult Health

## 2012-10-04 ENCOUNTER — Other Ambulatory Visit: Payer: Self-pay

## 2012-10-04 ENCOUNTER — Ambulatory Visit (INDEPENDENT_AMBULATORY_CARE_PROVIDER_SITE_OTHER): Payer: Medicare Other

## 2012-10-04 ENCOUNTER — Other Ambulatory Visit: Payer: Self-pay | Admitting: *Deleted

## 2012-10-04 DIAGNOSIS — Z7901 Long term (current) use of anticoagulants: Secondary | ICD-10-CM

## 2012-10-04 DIAGNOSIS — I639 Cerebral infarction, unspecified: Secondary | ICD-10-CM

## 2012-10-04 DIAGNOSIS — E785 Hyperlipidemia, unspecified: Secondary | ICD-10-CM

## 2012-10-04 DIAGNOSIS — I6350 Cerebral infarction due to unspecified occlusion or stenosis of unspecified cerebral artery: Secondary | ICD-10-CM

## 2012-10-04 DIAGNOSIS — I4891 Unspecified atrial fibrillation: Secondary | ICD-10-CM

## 2012-10-04 DIAGNOSIS — N186 End stage renal disease: Secondary | ICD-10-CM

## 2012-10-04 MED ORDER — WARFARIN SODIUM 6 MG PO TABS: ORAL_TABLET | ORAL | Status: AC

## 2012-10-04 NOTE — Telephone Encounter (Signed)
Lupita Leash, Could you please call [facility] [phone] and find out about the referral I made for Mr. Nesbitt for hospice services? I made the referral on the day I saw him and his daughter states that no one has called her yet. Also, please provide them with the daughter's contact.  Thanks, Magie Ciampa

## 2012-10-04 NOTE — Telephone Encounter (Signed)
Caregiver says pt still losing weight and not eating Asks if this may r/t clonidine or felodipine She also asks if we could prescribe megace to see if this helps appetite I told her I would check with Dr. Mariah Milling and call her back Understanding verb

## 2012-10-04 NOTE — Progress Notes (Signed)
Please review and refill, Thank You. 

## 2012-10-05 ENCOUNTER — Other Ambulatory Visit: Payer: Self-pay | Admitting: *Deleted

## 2012-10-05 DIAGNOSIS — E785 Hyperlipidemia, unspecified: Secondary | ICD-10-CM

## 2012-10-05 MED ORDER — FENOFIBRATE 145 MG PO TABS: 145.0000 mg | ORAL_TABLET | Freq: Every day | ORAL | Status: AC

## 2012-10-06 ENCOUNTER — Telehealth: Payer: Self-pay | Admitting: *Deleted

## 2012-10-06 NOTE — Telephone Encounter (Signed)
Terry from [facility] [place] [facility] [place] left a message stating Scott Norman has been admitted to hospice care. She would like to know if his Paroxetine CR could be changed to just plain Paroxetine 10 mg. BID or 30 mg daily? The CR Paroxetine is not on their formulary. Please give her a call.

## 2012-10-09 NOTE — Telephone Encounter (Signed)
Script called to hospice triage nurse.

## 2012-10-09 NOTE — Telephone Encounter (Signed)
Dr. Dan Humphreys could you advise please. Note closed but need to address medication change.

## 2012-10-09 NOTE — Telephone Encounter (Signed)
Fine to try the Paxil 10mg  po bid #60 with 3 refills.

## 2012-10-16 ENCOUNTER — Ambulatory Visit: Payer: Self-pay | Admitting: Oncology

## 2012-10-17 LAB — IRON AND TIBC
Iron Bind.Cap.(Total): 249 ug/dL — ABNORMAL LOW (ref 250–450)
Iron Saturation: 14 %
Unbound Iron-Bind.Cap.: 215 ug/dL

## 2012-10-17 LAB — CBC CANCER CENTER
Basophil #: 0.1 x10 3/mm (ref 0.0–0.1)
Basophil %: 0.7 %
Eosinophil #: 0.1 x10 3/mm (ref 0.0–0.7)
Eosinophil %: 1.7 %
HCT: 34.5 % — ABNORMAL LOW (ref 40.0–52.0)
HGB: 11.4 g/dL — ABNORMAL LOW (ref 13.0–18.0)
Lymphocyte #: 1.2 x10 3/mm (ref 1.0–3.6)
Lymphocyte %: 16.3 %
MCH: 28.7 pg (ref 26.0–34.0)
MCHC: 33.1 g/dL (ref 32.0–36.0)
MCV: 87 fL (ref 80–100)
Monocyte #: 0.6 x10 3/mm (ref 0.2–1.0)
Monocyte %: 8.4 %
Neutrophil #: 5.5 x10 3/mm (ref 1.4–6.5)
Neutrophil %: 72.9 %
Platelet: 281 x10 3/mm (ref 150–440)
RBC: 3.97 10*6/uL — ABNORMAL LOW (ref 4.40–5.90)
RDW: 15 % — ABNORMAL HIGH (ref 11.5–14.5)
WBC: 7.6 x10 3/mm (ref 3.8–10.6)

## 2012-10-18 ENCOUNTER — Telehealth: Payer: Self-pay | Admitting: *Deleted

## 2012-10-18 ENCOUNTER — Ambulatory Visit (INDEPENDENT_AMBULATORY_CARE_PROVIDER_SITE_OTHER): Payer: Medicare Other

## 2012-10-18 DIAGNOSIS — I639 Cerebral infarction, unspecified: Secondary | ICD-10-CM

## 2012-10-18 DIAGNOSIS — I4891 Unspecified atrial fibrillation: Secondary | ICD-10-CM

## 2012-10-18 DIAGNOSIS — I6350 Cerebral infarction due to unspecified occlusion or stenosis of unspecified cerebral artery: Secondary | ICD-10-CM

## 2012-10-18 DIAGNOSIS — Z7901 Long term (current) use of anticoagulants: Secondary | ICD-10-CM

## 2012-10-18 LAB — POCT INR: INR: 1.7

## 2012-10-18 MED ORDER — HYDROCODONE-ACETAMINOPHEN 10-325 MG PO TABS: 1.0000 | ORAL_TABLET | ORAL | Status: AC | PRN

## 2012-10-18 MED ORDER — HYDROCODONE-ACETAMINOPHEN 10-325 MG PO TABS: 1.0000 | ORAL_TABLET | ORAL | Status: DC | PRN

## 2012-10-18 NOTE — Telephone Encounter (Signed)
Pt's daughter called, stating he needs the refill on the hydrocodone ASAP for his pain

## 2012-10-18 NOTE — Telephone Encounter (Signed)
Becky, Please send to Dr. Darrick Huntsman. I made the referral to hospice but she is the PCP

## 2012-10-18 NOTE — Telephone Encounter (Signed)
Medication filled daughter notified script faxed

## 2012-10-18 NOTE — Telephone Encounter (Signed)
Tammy from [facility] called, to update on pt's blood pressure. Readings can range from 70/40 to 210/110. Does not take blood pressure medicines on the days with hypotension. Wants to know what to do and to make aware.  Also, pt is needing a Rx for Hydrocodone 1 tab every 4 hours prn faxed to [facility].

## 2012-10-20 ENCOUNTER — Inpatient Hospital Stay: Payer: Self-pay | Admitting: Internal Medicine

## 2012-10-20 LAB — COMPREHENSIVE METABOLIC PANEL
Alkaline Phosphatase: 50 U/L (ref 50–136)
Bilirubin,Total: 0.3 mg/dL (ref 0.2–1.0)
Calcium, Total: 8.9 mg/dL (ref 8.5–10.1)
Co2: 24 mmol/L (ref 21–32)
Creatinine: 1.81 mg/dL — ABNORMAL HIGH (ref 0.60–1.30)
EGFR (Non-African Amer.): 35 — ABNORMAL LOW
Glucose: 102 mg/dL — ABNORMAL HIGH (ref 65–99)
Osmolality: 288 (ref 275–301)
SGOT(AST): 34 U/L (ref 15–37)

## 2012-10-20 LAB — CBC
HCT: 34.7 % — ABNORMAL LOW (ref 40.0–52.0)
HGB: 11.6 g/dL — ABNORMAL LOW (ref 13.0–18.0)
MCH: 28.9 pg (ref 26.0–34.0)
MCHC: 33.6 g/dL (ref 32.0–36.0)

## 2012-10-20 LAB — TROPONIN I
Troponin-I: <0.02 ng/mL
Troponin-I: 0.02 ng/mL

## 2012-10-20 LAB — URINALYSIS, COMPLETE
Bacteria: NONE SEEN
Bilirubin,UR: NEGATIVE
Blood: NEGATIVE
Glucose,UR: NEGATIVE mg/dL (ref 0–75)
Hyaline Cast: 3
Ph: 7 (ref 4.5–8.0)
Protein: 100
RBC,UR: 8 /HPF (ref 0–5)
Specific Gravity: 1.014 (ref 1.003–1.030)
WBC UR: 4 /HPF (ref 0–5)

## 2012-10-20 LAB — CK TOTAL AND CKMB (NOT AT ARMC)
CK, Total: 29 U/L — ABNORMAL LOW (ref 35–232)
CK-MB: 1 ng/mL (ref 0.5–3.6)

## 2012-10-20 LAB — PROTIME-INR: INR: 1.6

## 2012-10-20 LAB — PRO B NATRIURETIC PEPTIDE: B-Type Natriuretic Peptide: 28063 pg/mL — ABNORMAL HIGH (ref 0–450)

## 2012-10-21 DIAGNOSIS — I5030 Unspecified diastolic (congestive) heart failure: Secondary | ICD-10-CM

## 2012-10-21 DIAGNOSIS — I4891 Unspecified atrial fibrillation: Secondary | ICD-10-CM

## 2012-10-21 DIAGNOSIS — I10 Essential (primary) hypertension: Secondary | ICD-10-CM

## 2012-10-21 LAB — BASIC METABOLIC PANEL
Anion Gap: 5 — ABNORMAL LOW (ref 7–16)
BUN: 35 mg/dL — ABNORMAL HIGH (ref 7–18)
Calcium, Total: 8.9 mg/dL (ref 8.5–10.1)
Co2: 26 mmol/L (ref 21–32)
Creatinine: 1.96 mg/dL — ABNORMAL HIGH (ref 0.60–1.30)
EGFR (Non-African Amer.): 31 — ABNORMAL LOW
Osmolality: 286 (ref 275–301)

## 2012-10-21 LAB — TROPONIN I: Troponin-I: <0.02 ng/mL

## 2012-10-21 LAB — CBC WITH DIFFERENTIAL/PLATELET
Basophil #: 0.1 10*3/uL (ref 0.0–0.1)
Eosinophil #: 0.2 10*3/uL (ref 0.0–0.7)
Eosinophil %: 3.2 %
Lymphocyte #: 1 10*3/uL (ref 1.0–3.6)
MCH: 29.2 pg (ref 26.0–34.0)
MCV: 86 fL (ref 80–100)
Neutrophil %: 73.9 %
Platelet: 295 10*3/uL (ref 150–440)
RBC: 4.01 10*6/uL — ABNORMAL LOW (ref 4.40–5.90)
RDW: 15.1 % — ABNORMAL HIGH (ref 11.5–14.5)

## 2012-10-21 LAB — CK TOTAL AND CKMB (NOT AT ARMC): CK, Total: 30 U/L — ABNORMAL LOW (ref 35–232)

## 2012-10-21 LAB — MAGNESIUM: Magnesium: 1.8 mg/dL

## 2012-10-21 LAB — LIPID PANEL
Ldl Cholesterol, Calc: 95 mg/dL (ref 0–100)
VLDL Cholesterol, Calc: 27 mg/dL (ref 5–40)

## 2012-10-21 LAB — TSH: Thyroid Stimulating Horm: 2.02 u[IU]/mL

## 2012-10-22 DIAGNOSIS — I359 Nonrheumatic aortic valve disorder, unspecified: Secondary | ICD-10-CM

## 2012-10-22 LAB — BASIC METABOLIC PANEL
Chloride: 105 mmol/L (ref 98–107)
Creatinine: 2.1 mg/dL — ABNORMAL HIGH (ref 0.60–1.30)
EGFR (African American): 33 — ABNORMAL LOW
EGFR (Non-African Amer.): 29 — ABNORMAL LOW
Glucose: 95 mg/dL (ref 65–99)
Osmolality: 285 (ref 275–301)
Potassium: 3.7 mmol/L (ref 3.5–5.1)
Sodium: 139 mmol/L (ref 136–145)

## 2012-10-22 LAB — PROTIME-INR: Prothrombin Time: 18.3 secs — ABNORMAL HIGH (ref 11.5–14.7)

## 2012-10-23 LAB — CBC WITH DIFFERENTIAL/PLATELET
Basophil #: 0 10*3/uL (ref 0.0–0.1)
Basophil %: 0.5 %
Eosinophil #: 0.2 10*3/uL (ref 0.0–0.7)
Eosinophil %: 2.3 %
HCT: 35.4 % — ABNORMAL LOW (ref 40.0–52.0)
HGB: 11.7 g/dL — ABNORMAL LOW (ref 13.0–18.0)
Lymphocyte %: 17.6 %
MCH: 28.6 pg (ref 26.0–34.0)
MCHC: 33.1 g/dL (ref 32.0–36.0)
Neutrophil #: 5.5 10*3/uL (ref 1.4–6.5)
Neutrophil %: 69.3 %
RBC: 4.1 10*6/uL — ABNORMAL LOW (ref 4.40–5.90)
RDW: 14.8 % — ABNORMAL HIGH (ref 11.5–14.5)

## 2012-10-23 LAB — BASIC METABOLIC PANEL
Anion Gap: 9 (ref 7–16)
BUN: 41 mg/dL — ABNORMAL HIGH (ref 7–18)
Chloride: 105 mmol/L (ref 98–107)
EGFR (Non-African Amer.): 25 — ABNORMAL LOW
Glucose: 115 mg/dL — ABNORMAL HIGH (ref 65–99)
Osmolality: 290 (ref 275–301)
Potassium: 3.9 mmol/L (ref 3.5–5.1)

## 2012-10-23 LAB — PROTIME-INR: INR: 1.5

## 2012-10-24 ENCOUNTER — Encounter: Payer: Self-pay | Admitting: *Deleted

## 2012-10-24 ENCOUNTER — Ambulatory Visit: Payer: Medicare Other | Admitting: Cardiovascular Disease

## 2012-10-24 LAB — PROTIME-INR: INR: 1.5

## 2012-10-24 NOTE — Telephone Encounter (Signed)
Pt hospitalized and d/c to [facility] 10/24/12

## 2012-10-29 ENCOUNTER — Ambulatory Visit: Payer: Self-pay | Admitting: Oncology

## 2012-11-09 ENCOUNTER — Encounter: Payer: Self-pay | Admitting: *Deleted

## 2012-11-10 ENCOUNTER — Ambulatory Visit (INDEPENDENT_AMBULATORY_CARE_PROVIDER_SITE_OTHER): Admitting: Physician Assistant

## 2012-11-10 ENCOUNTER — Encounter: Payer: Self-pay | Admitting: Physician Assistant

## 2012-11-10 VITALS — BP 169/58 | HR 66 | Ht 72.0 in | Wt 234.2 lb

## 2012-11-10 DIAGNOSIS — F411 Generalized anxiety disorder: Secondary | ICD-10-CM

## 2012-11-10 DIAGNOSIS — I350 Nonrheumatic aortic (valve) stenosis: Secondary | ICD-10-CM

## 2012-11-10 DIAGNOSIS — I509 Heart failure, unspecified: Secondary | ICD-10-CM

## 2012-11-10 DIAGNOSIS — I5032 Chronic diastolic (congestive) heart failure: Secondary | ICD-10-CM

## 2012-11-10 DIAGNOSIS — I5042 Chronic combined systolic (congestive) and diastolic (congestive) heart failure: Secondary | ICD-10-CM | POA: Insufficient documentation

## 2012-11-10 DIAGNOSIS — R609 Edema, unspecified: Secondary | ICD-10-CM

## 2012-11-10 DIAGNOSIS — I10 Essential (primary) hypertension: Secondary | ICD-10-CM

## 2012-11-10 DIAGNOSIS — R600 Localized edema: Secondary | ICD-10-CM | POA: Insufficient documentation

## 2012-11-10 DIAGNOSIS — I359 Nonrheumatic aortic valve disorder, unspecified: Secondary | ICD-10-CM

## 2012-11-10 DIAGNOSIS — I4891 Unspecified atrial fibrillation: Secondary | ICD-10-CM

## 2012-11-10 DIAGNOSIS — F419 Anxiety disorder, unspecified: Secondary | ICD-10-CM

## 2012-11-10 NOTE — Assessment & Plan Note (Addendum)
Baseline functional capacity NYHA II-III. He has tolerated PT well post-discharge and has regained his strength. The plan is to discharge back home within one week given his progress. He denies worsening SOB/DOE, PND, orthopnea or worsening LE edema. Chronic LE edema on exam. No appreciable JVD or adventitious lung sounds. Will continue Lasix PRN. Discussed continued weight monitoring, salt and fluid restriction and compression stockings. He receives support from his caregiver regarding this. Renal function precludes ACEi/ARB. Bradycardia with BBs. Aim will be for symptomatic improvement and BP management.

## 2012-11-10 NOTE — Progress Notes (Signed)
Date:  11/10/2012   ID:  Scott Norman, DOB [birth year], MRN [id number]  PCP:  Duncan Dull, MD  Primary Cardiologist:  Concha Se, MD  History of Present Illness:  Scott Norman is a 79 y.o. male PMHx s/f PAF, h/o of ischemic CVA in this setting with residual R-sided hemiparesis and expressive aphasia, on chronic Coumadin anticoagulation, mild-mod aortic stenosis, CKD (stage III, baseline Cr 2.5-2.7), difficult to manage hypertension, RAS (s/p stent placement) who presents today for follow-up.   He as last seen 09/21/12. He was under significant stress at that time undergoing a divorce. He had struggled with chronic LE edema, venous insufficiency with ulcerations requiring leg wraps and discontinuation of Norvasc.   He was recently admitted at [facility] 10/24/12 for acute on chronic diastolic CHF in the setting of uncontrolled HTN. BPs have been difficult to control due to intolerances to BB, clonidine (bradycardia). Renal function precludes ACEi/ARB/ald ant/diuretics. Renal artery stent determined to be patent by recent u/s 07/2012. Discharged On clonidine, Cardura, minoxidil, hydralazine, isosorbide. Lasix 20mg  PO BID PRN for swelling. Of note, Norvasc PRN for uncontrolled HTN recommended despite edema by Dr. Mariah Milling just prior to discharge. He was discharged to a SNF. He is a DNR.   2D echo 10/24/12: EF 45-50%, impaired LV relaxation, mod AS, mod concentric LVH, normal RVSP & RV size  INR subtherapeutic at 1.7 on 10/18/12.   He has been undergoing PT as SNF and making daily progress. His caregiver is with him today. She supplements his history/descriptions due to expressive aphasia. BP has been well-controlled. He is wearing special medicated compression stocks. His LE ulcerations and edema have both improved. He has continued on his discharge antihypertensive regimen. Denies SOB/DOE, chest pain, PND or orthopnea.  EKG: NSR, 66 bpm, 1st degree VB, QRS 170 msec, no ST/T changes  Wt Readings  from Last 3 Encounters:  11/10/12 234 lb 4 oz (106.255 kg)  09/21/12 219 lb 8 oz (99.565 kg)  08/02/12 223 lb 1.7 oz (101.2 kg)     Past Medical History  Diagnosis Date  . Diabetes mellitus   . Degenerative disc disease, lumbar     back surgery 2011  . Gout   . Atrial fibrillation   . PVD (peripheral vascular disease)   . Aortic stenosis   . Hyperlipidemia   . H/O colonoscopy June 2007    normal  . Screening for osteoporosis Feb 2012    T -1.3 hip  . Heart murmur   . Anxiety   . Right knee DJD   . Hypertension     dr tim gollan   [facility] [place]  . Right knee DJD 02/28/2012  . Chronic kidney disease (CKD), stage III (moderate)     20%  . Renal artery stenosis, native, bilateral     s/p right RA stent 2012, pending left   . Bone infection of right foot   . Sleep apnea     does not use CPAP  . Stroke 2012    Rmbloic, with expressive aphasia-   . Shortness of breath     with activity  . Pneumonia     2013  . Anemia     2013 had Iron treatments    Current Outpatient Prescriptions  Medication Sig Dispense Refill  . ALPRAZolam (XANAX) 0.5 MG tablet Take 0.25 mg by mouth 3 (three) times daily as needed for anxiety.      Marland Kitchen buPROPion (WELLBUTRIN SR) 150 MG 12 hr tablet Take 1 tablet (  150 mg total) by mouth 2 (two) times daily.  60 tablet  3  . cetirizine (ZYRTEC) 10 MG tablet Take 10 mg by mouth daily as needed for allergies.      . cholecalciferol (VITAMIN D) 1000 UNITS tablet Take 2,000 Units by mouth daily.      . cloNIDine (CATAPRES) 0.2 MG tablet Take 0.1 mg by mouth 3 (three) times daily.      . diazepam (VALIUM) 5 MG tablet Take 5 mg by mouth at bedtime.       Marland Kitchen doxazosin (CARDURA) 4 MG tablet Take 4 mg by mouth daily. In morning      . doxazosin (CARDURA) 8 MG tablet Take 8 mg by mouth at bedtime.       . felodipine (PLENDIL) 10 MG 24 hr tablet Take 10 mg by mouth daily.      . fenofibrate (TRICOR) 145 MG tablet Take 1 tablet (145 mg total) by mouth daily.  30  tablet  5  . furosemide (LASIX) 40 MG tablet Take 1 tablet (40 mg total) by mouth daily as needed.  30 tablet  3  . hydrALAZINE (APRESOLINE) 100 MG tablet Take 1.5 tablets (150 mg total) by mouth 4 (four) times daily.  180 tablet  6  . HYDROcodone-acetaminophen (NORCO) 10-325 MG per tablet Take 1 tablet by mouth every 4 (four) hours as needed for pain.  120 tablet  5  . isosorbide mononitrate (IMDUR) 60 MG 24 hr tablet Take 60 mg by mouth 2 (two) times daily. Take 1 tablet at bed time.  If hypertension in am then take one as needed in am      . ketoconazole (NIZORAL) 2 % cream Apply 1 application topically daily as needed (rash).       . labetalol (NORMODYNE) 200 MG tablet Take 100 mg by mouth 2 (two) times daily.      . nitroGLYCERIN (NITROSTAT) 0.4 MG SL tablet Place 0.4 mg under the tongue every 5 (five) minutes as needed. For chest pain      . nystatin (MYCOSTATIN) powder Apply topically 4 (four) times daily.  15 g  0  . PARoxetine (PAXIL-CR) 25 MG 24 hr tablet Take 1 tablet (25 mg total) by mouth every morning.  30 tablet  3  . polyethylene glycol (MIRALAX / GLYCOLAX) packet Take 17 g by mouth daily as needed (constipation).       . warfarin (COUMADIN) 6 MG tablet Take as directed by coumadin clinic  30 tablet  3   No current facility-administered medications for this visit.    Allergies:    Allergies  Allergen Reactions  . Ace Inhibitors     Renal insufficiency  . Amlodipine     Severe leg edema  . Beta Adrenergic Blockers     bradycardia  . Statins     Social History:  The patient  reports that he has never smoked. He has never used smokeless tobacco. He reports that he does not drink alcohol or use illicit drugs.   Family History:  Family History  Problem Relation Age of Onset  . Hypertension Other   . Diabetes Other   . Pancreatic cancer Mother     Review of Systems: General:  negative for chills, fever, night sweats or weight changes.  Cardiovascular: positive for  edema, negative for chest pain, dyspnea on exertion,  orthopnea, palpitations, paroxysmal nocturnal dyspnea or shortness of breath Dermatological:  negative for rash Respiratory:  negative for cough or wheezing  Urologic:  negative for hematuria Abdominal:  negative for nausea, vomiting, diarrhea, bright red blood per rectum, melena, or hematemesis Neurologic:  negative for visual changes, syncope, or dizziness All other systems reviewed and are otherwise negative except as noted above.  PHYSICAL EXAM: VS:  BP 169/58  Ht 6' (1.829 m)  Wt 234 lb 4 oz (106.255 kg)  BMI 31.76 kg/m2  Obese elderly appearing male in no acute distress HEENT: normal Neck: no JVD Cardiac:  normal S1, S2, III/VI systolic crescendo-decrescendo murmur at RUSB; RRR, no gallops or rubs Lungs:  clear to auscultation bilaterally, no wheezing, rhonchi or rales Abd: soft, nontender, no hepatomegaly Ext: 2+ bilateral pitting edema with healing ulceration on LLE Skin: warm and dry Neuro:  CNs 2-12 intact, no focal abnormalities noted

## 2012-11-10 NOTE — Assessment & Plan Note (Addendum)
BP 169/58 today. This is much improved from prior readings (SBP 180-220). Caregiver notes BP readings at SNF facility have remained well-controlled (SBP 130-140s at times). Although new guidelines recommend <150/90, will continue current antihypertensive regimen given multiple drug intolerances and functional decline with adjustments. Discussed importance of anxiety management also. He is recovering well at SNF post-discharge.

## 2012-11-10 NOTE — Assessment & Plan Note (Signed)
Moderate AS by echo last month. Will continue to monitor with annual echocardiograms.

## 2012-11-10 NOTE — Patient Instructions (Addendum)
Please continue to monitor your symptoms (shortness of breath) and signs (weight gain, leg swelling).   If weight increased by 3 lbs in one day or 5 lbs in two days, take a Lasix as needed.   Continue your current blood pressure medications and monitor your blood pressure daily.   Limit salt and fluid intake (less than 2 grams sodium, less than 2 Liters of fluid per day). Continue to wear compression stockings/socks.   We will see you back in 6 months.

## 2012-11-10 NOTE — Addendum Note (Signed)
Addended by: Odella Aquas A on: 11/10/2012 04:13 PM   Modules accepted: Level of Service

## 2012-11-10 NOTE — Assessment & Plan Note (Signed)
Chronic secondary to likely CVI from obesity and chronic CHF. This has improved per patient. LLE healing ulcerations appreciated. Compression socks and fluid management has helped. Continue PRN Lasix.

## 2012-11-10 NOTE — Assessment & Plan Note (Addendum)
Stress has been managed since court proceedings with this divorce. This has been established as an exacerbating factor to his difficult to control HTN. Continue anxiolytics PRN.

## 2012-11-16 ENCOUNTER — Inpatient Hospital Stay: Payer: Self-pay | Admitting: Student

## 2012-11-16 LAB — COMPREHENSIVE METABOLIC PANEL
Albumin: 3 g/dL — ABNORMAL LOW (ref 3.4–5.0)
Alkaline Phosphatase: 69 U/L (ref 50–136)
Anion Gap: 7 (ref 7–16)
BUN: 84 mg/dL — ABNORMAL HIGH (ref 7–18)
Co2: 22 mmol/L (ref 21–32)
Creatinine: 4.11 mg/dL — ABNORMAL HIGH (ref 0.60–1.30)
EGFR (African American): 15 — ABNORMAL LOW
EGFR (Non-African Amer.): 13 — ABNORMAL LOW
Glucose: 129 mg/dL — ABNORMAL HIGH (ref 65–99)
Potassium: 5.1 mmol/L (ref 3.5–5.1)
SGOT(AST): 15 U/L (ref 15–37)
SGPT (ALT): 16 U/L (ref 12–78)
Sodium: 133 mmol/L — ABNORMAL LOW (ref 136–145)
Total Protein: 6.7 g/dL (ref 6.4–8.2)

## 2012-11-16 LAB — PROTIME-INR: INR: 6.1

## 2012-11-16 LAB — CK TOTAL AND CKMB (NOT AT ARMC)
CK, Total: 49 U/L (ref 35–232)
CK-MB: 2.5 ng/mL (ref 0.5–3.6)

## 2012-11-16 LAB — URINALYSIS, COMPLETE
Bilirubin,UR: NEGATIVE
Glucose,UR: NEGATIVE mg/dL (ref 0–75)
Nitrite: NEGATIVE
Ph: 5 (ref 4.5–8.0)
RBC,UR: 3 /HPF (ref 0–5)
Specific Gravity: 1.013 (ref 1.003–1.030)

## 2012-11-16 LAB — CBC
HGB: 9.6 g/dL — ABNORMAL LOW (ref 13.0–18.0)
MCHC: 33.2 g/dL (ref 32.0–36.0)
RDW: 15.5 % — ABNORMAL HIGH (ref 11.5–14.5)
WBC: 6.4 10*3/uL (ref 3.8–10.6)

## 2012-11-16 LAB — PRO B NATRIURETIC PEPTIDE: B-Type Natriuretic Peptide: 57450 pg/mL — ABNORMAL HIGH (ref 0–450)

## 2012-11-17 DIAGNOSIS — I4891 Unspecified atrial fibrillation: Secondary | ICD-10-CM

## 2012-11-17 DIAGNOSIS — I5043 Acute on chronic combined systolic (congestive) and diastolic (congestive) heart failure: Secondary | ICD-10-CM

## 2012-11-17 DIAGNOSIS — I10 Essential (primary) hypertension: Secondary | ICD-10-CM

## 2012-11-17 LAB — CBC WITH DIFFERENTIAL/PLATELET
Basophil #: 0 10*3/uL (ref 0.0–0.1)
Eosinophil #: 0.1 10*3/uL (ref 0.0–0.7)
Eosinophil %: 1.9 %
HCT: 27.3 % — ABNORMAL LOW (ref 40.0–52.0)
HGB: 9.1 g/dL — ABNORMAL LOW (ref 13.0–18.0)
Lymphocyte #: 0.7 10*3/uL — ABNORMAL LOW (ref 1.0–3.6)
MCHC: 33.3 g/dL (ref 32.0–36.0)
MCV: 87 fL (ref 80–100)
Monocyte #: 0.7 x10 3/mm (ref 0.2–1.0)
Neutrophil #: 4.8 10*3/uL (ref 1.4–6.5)
Platelet: 220 10*3/uL (ref 150–440)
RBC: 3.15 10*6/uL — ABNORMAL LOW (ref 4.40–5.90)

## 2012-11-17 LAB — BASIC METABOLIC PANEL
Anion Gap: 10 (ref 7–16)
BUN: 90 mg/dL — ABNORMAL HIGH (ref 7–18)
Calcium, Total: 8.7 mg/dL (ref 8.5–10.1)
Chloride: 105 mmol/L (ref 98–107)
Co2: 21 mmol/L (ref 21–32)
Creatinine: 4.13 mg/dL — ABNORMAL HIGH (ref 0.60–1.30)
EGFR (African American): 15 — ABNORMAL LOW
EGFR (Non-African Amer.): 13 — ABNORMAL LOW
Glucose: 100 mg/dL — ABNORMAL HIGH (ref 65–99)
Potassium: 4.7 mmol/L (ref 3.5–5.1)
Sodium: 136 mmol/L (ref 136–145)

## 2012-11-17 LAB — PROTIME-INR: Prothrombin Time: 48.5 secs — ABNORMAL HIGH (ref 11.5–14.7)

## 2012-11-18 LAB — RENAL FUNCTION PANEL
Albumin: 2.8 g/dL — ABNORMAL LOW (ref 3.4–5.0)
Anion Gap: 9 (ref 7–16)
BUN: 93 mg/dL — ABNORMAL HIGH (ref 7–18)
Chloride: 106 mmol/L (ref 98–107)
Co2: 23 mmol/L (ref 21–32)
EGFR (African American): 16 — ABNORMAL LOW
Glucose: 96 mg/dL (ref 65–99)
Osmolality: 304 (ref 275–301)
Phosphorus: 5 mg/dL — ABNORMAL HIGH (ref 2.5–4.9)
Sodium: 138 mmol/L (ref 136–145)

## 2012-11-18 LAB — PROTIME-INR
INR: 5.1
Prothrombin Time: 44.9 secs — ABNORMAL HIGH (ref 11.5–14.7)

## 2012-11-19 LAB — BASIC METABOLIC PANEL
Anion Gap: 11 (ref 7–16)
BUN: 95 mg/dL — ABNORMAL HIGH (ref 7–18)
Chloride: 107 mmol/L (ref 98–107)
Co2: 24 mmol/L (ref 21–32)
Creatinine: 3.57 mg/dL — ABNORMAL HIGH (ref 0.60–1.30)
Glucose: 104 mg/dL — ABNORMAL HIGH (ref 65–99)
Osmolality: 313 (ref 275–301)
Potassium: 3.8 mmol/L (ref 3.5–5.1)

## 2012-11-19 LAB — PROTIME-INR: INR: 4.2

## 2012-11-20 LAB — BASIC METABOLIC PANEL
Anion Gap: 10 (ref 7–16)
BUN: 95 mg/dL — ABNORMAL HIGH (ref 7–18)
Calcium, Total: 8.6 mg/dL (ref 8.5–10.1)
Chloride: 105 mmol/L (ref 98–107)
EGFR (African American): 20 — ABNORMAL LOW
EGFR (Non-African Amer.): 17 — ABNORMAL LOW
Osmolality: 313 (ref 275–301)
Potassium: 3.4 mmol/L — ABNORMAL LOW (ref 3.5–5.1)

## 2012-11-21 LAB — BASIC METABOLIC PANEL
BUN: 93 mg/dL — ABNORMAL HIGH (ref 7–18)
Chloride: 104 mmol/L (ref 98–107)
Co2: 30 mmol/L (ref 21–32)
Creatinine: 2.8 mg/dL — ABNORMAL HIGH (ref 0.60–1.30)
EGFR (African American): 24 — ABNORMAL LOW
Glucose: 134 mg/dL — ABNORMAL HIGH (ref 65–99)
Potassium: 3.3 mmol/L — ABNORMAL LOW (ref 3.5–5.1)

## 2012-11-21 LAB — CBC WITH DIFFERENTIAL/PLATELET
Basophil #: 0.1 10*3/uL (ref 0.0–0.1)
Eosinophil %: 2.6 %
HCT: 33.2 % — ABNORMAL LOW (ref 40.0–52.0)
Lymphocyte #: 0.6 10*3/uL — ABNORMAL LOW (ref 1.0–3.6)
Lymphocyte %: 5 %
MCH: 28.4 pg (ref 26.0–34.0)
MCHC: 32.7 g/dL (ref 32.0–36.0)
MCV: 87 fL (ref 80–100)
Monocyte %: 7.5 %
Neutrophil #: 10 10*3/uL — ABNORMAL HIGH (ref 1.4–6.5)
Neutrophil %: 84.3 %
Platelet: 296 10*3/uL (ref 150–440)
WBC: 11.8 10*3/uL — ABNORMAL HIGH (ref 3.8–10.6)

## 2012-11-21 LAB — PROTIME-INR
INR: 2.3
Prothrombin Time: 25 secs — ABNORMAL HIGH (ref 11.5–14.7)

## 2012-11-22 DIAGNOSIS — I5021 Acute systolic (congestive) heart failure: Secondary | ICD-10-CM

## 2012-11-22 LAB — BASIC METABOLIC PANEL
Anion Gap: 9 (ref 7–16)
BUN: 90 mg/dL — ABNORMAL HIGH (ref 7–18)
Calcium, Total: 9.2 mg/dL (ref 8.5–10.1)
Chloride: 103 mmol/L (ref 98–107)
Co2: 31 mmol/L (ref 21–32)
Creatinine: 2.48 mg/dL — ABNORMAL HIGH (ref 0.60–1.30)
EGFR (African American): 27 — ABNORMAL LOW
EGFR (Non-African Amer.): 24 — ABNORMAL LOW
Osmolality: 314 (ref 275–301)
Potassium: 3.2 mmol/L — ABNORMAL LOW (ref 3.5–5.1)
Sodium: 143 mmol/L (ref 136–145)

## 2012-11-22 LAB — PROTIME-INR: INR: 2.1

## 2012-11-28 ENCOUNTER — Ambulatory Visit: Payer: Self-pay | Admitting: Oncology

## 2012-12-20 ENCOUNTER — Telehealth: Payer: Self-pay

## 2012-12-20 NOTE — Telephone Encounter (Signed)
Called spoke with pt's daughter [name] while at [facility] visiting her father.  She states her father is not doing well.  Reports hallucinations on Morphine and general decline in pt's cognitive function and physical function. Daughter advised of Dr Windell Hummingbird recommendations, discussed with pt risk vs benefit of low dose Coumadin therapy.  Pt's daughter states given pt's current condition they do not wish to put pt on low dose Coumadin therapy at this time.  WCB with any changes in pt's condition or if after she discusses with her brother they decide they want to place pt back on Coumadin.

## 2012-12-20 NOTE — Telephone Encounter (Signed)
Message copied by Migdalia Dk on Wed Dec 20, 2012 11:53 AM ------      Message from: MUSE, TIFFANY J      Created: Mon Dec 18, 2012 10:17 AM      Regarding: FW: Coumadin Advise                   ----- Message -----         From: Antonieta Iba, MD         Sent: 12/17/2012   5:15 PM           To: Raul Del, RN      Subject: RE: Coumadin Advise                                      If we did warfarin,      It was only be 1 mg daily      This typically would not put him at increased risk of bleeding, even from a fall.      He does have a hx of CVA. Seems to still be having TIA sx.      Would probably suggest low dose warfarin like 1 mg a day      No need to check INRs as will be low            thx      Tim            ----- Message -----         From: Raul Del, RN         Sent: 12/13/2012   3:57 PM           To: Antonieta Iba, MD      Subject: Coumadin Advise                                          Daughter, [name] called states that her dad(patient) was discharged from hospital to [facility] on 11/22/12 by Dr Lesle Chris(?) and Dr Mariah Milling and it was decided that pt be maintained on low dose coumadin. She noticed yesterday that coumadin was not on his MAR and was told by Dr Erlinda Hong at [facility], that due to pts poor appetite( only drinking ensure and eating ice cream) and a fall that it was best that he not be on coumadin. Facility was thinking about discharging pt to home until yesterday when he experienced an episode where nurse thought he chocked on cereal but daughter not sure he had eaten anything. He also  became weak on one side that resolved in 15 mins and he didn't remember any of this daughter states. Daughter [name]) wants Dr Windell Hummingbird thoughts on coumadin therapy. She wasn't sure that those reasons that Dr Yetta Flock gave her were correct and I did inform her that he was correct that  falls and poor appetite could put patients are risk for elevated INRs and  internal bleeding.  Lisa's # between 8am-5pm [phone] or cell [phone]. Thank you.        ------

## 2012-12-21 ENCOUNTER — Ambulatory Visit: Payer: Self-pay | Admitting: Cardiovascular Disease

## 2012-12-21 DIAGNOSIS — I4891 Unspecified atrial fibrillation: Secondary | ICD-10-CM

## 2012-12-21 DIAGNOSIS — I639 Cerebral infarction, unspecified: Secondary | ICD-10-CM

## 2012-12-21 DIAGNOSIS — Z7901 Long term (current) use of anticoagulants: Secondary | ICD-10-CM

## 2012-12-29 DEATH — deceased

## 2013-11-09 IMAGING — CR DG CHEST 2V
2 series · 2 of 2 positions shown · non-contrast
Comparison: 12/06/2011
Correlation:  CT chest 12/07/2011

CLINICAL DATA: Follow up pneumonia

CHEST - 2 VIEW

[view not recorded (1 of 2)]
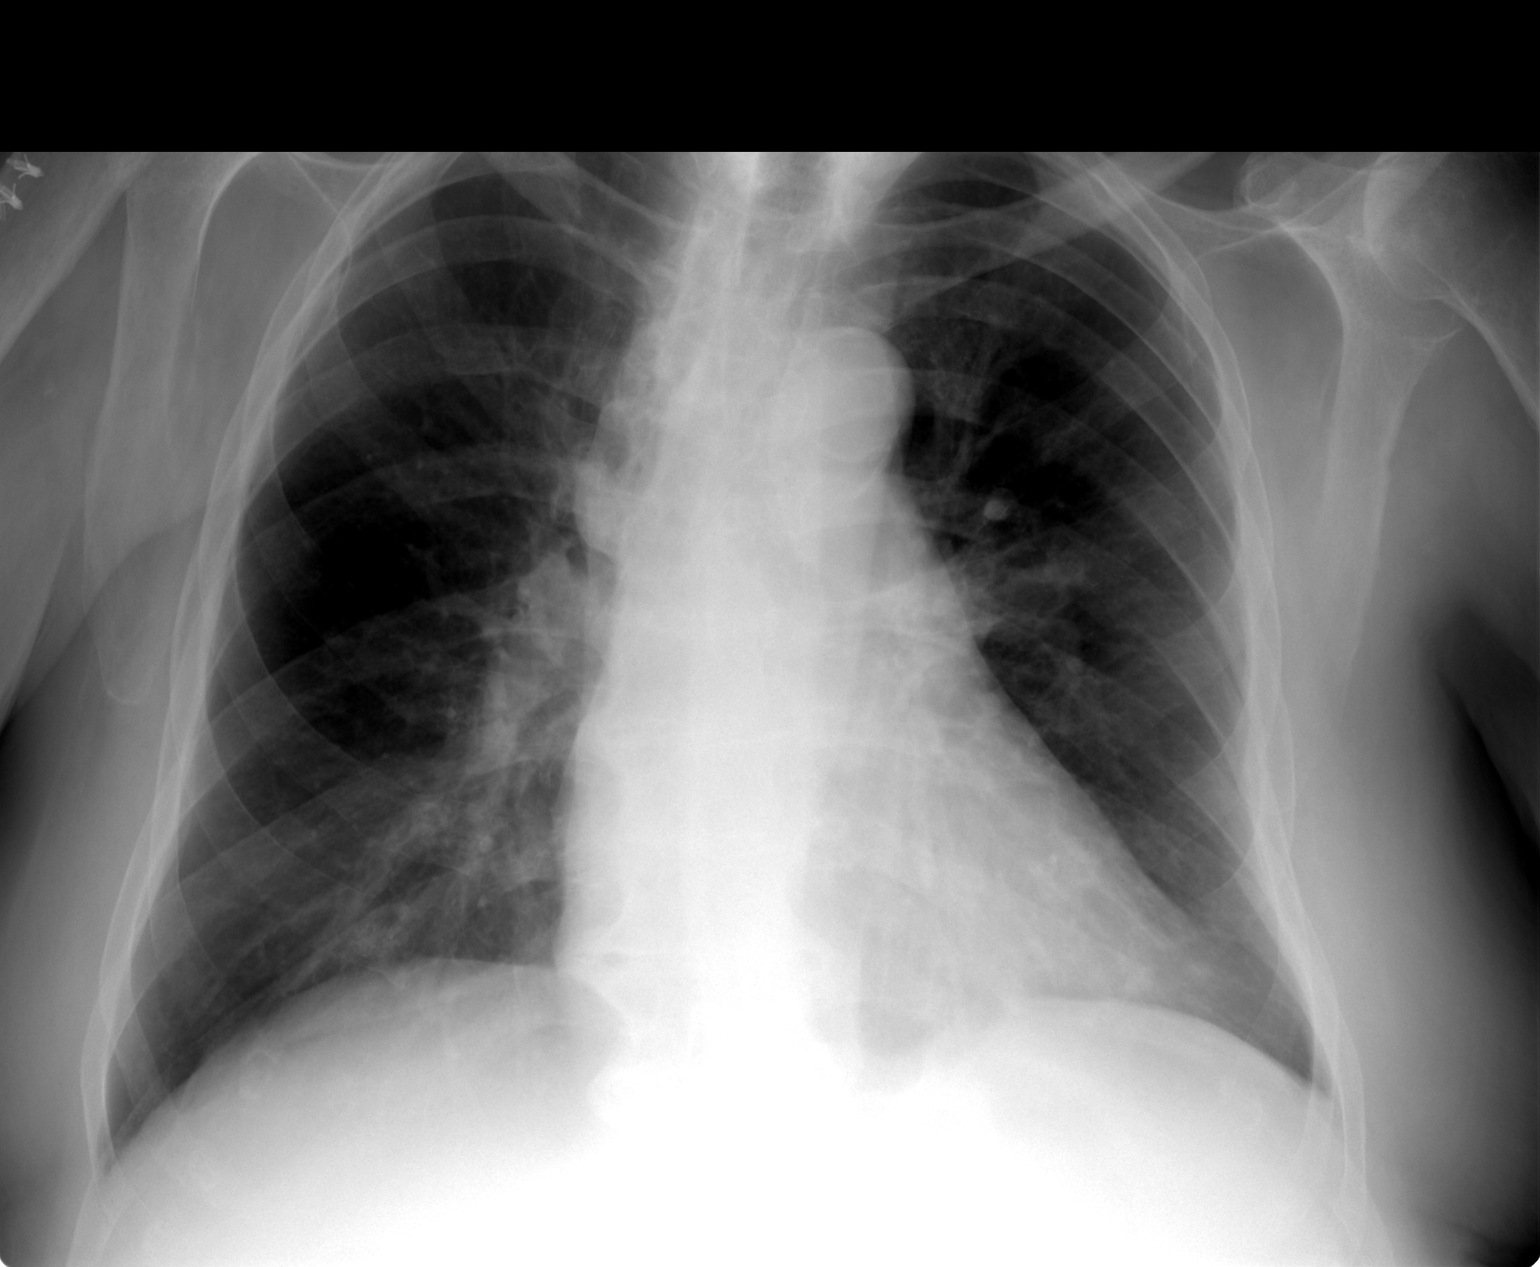

[view not recorded (2 of 2)]
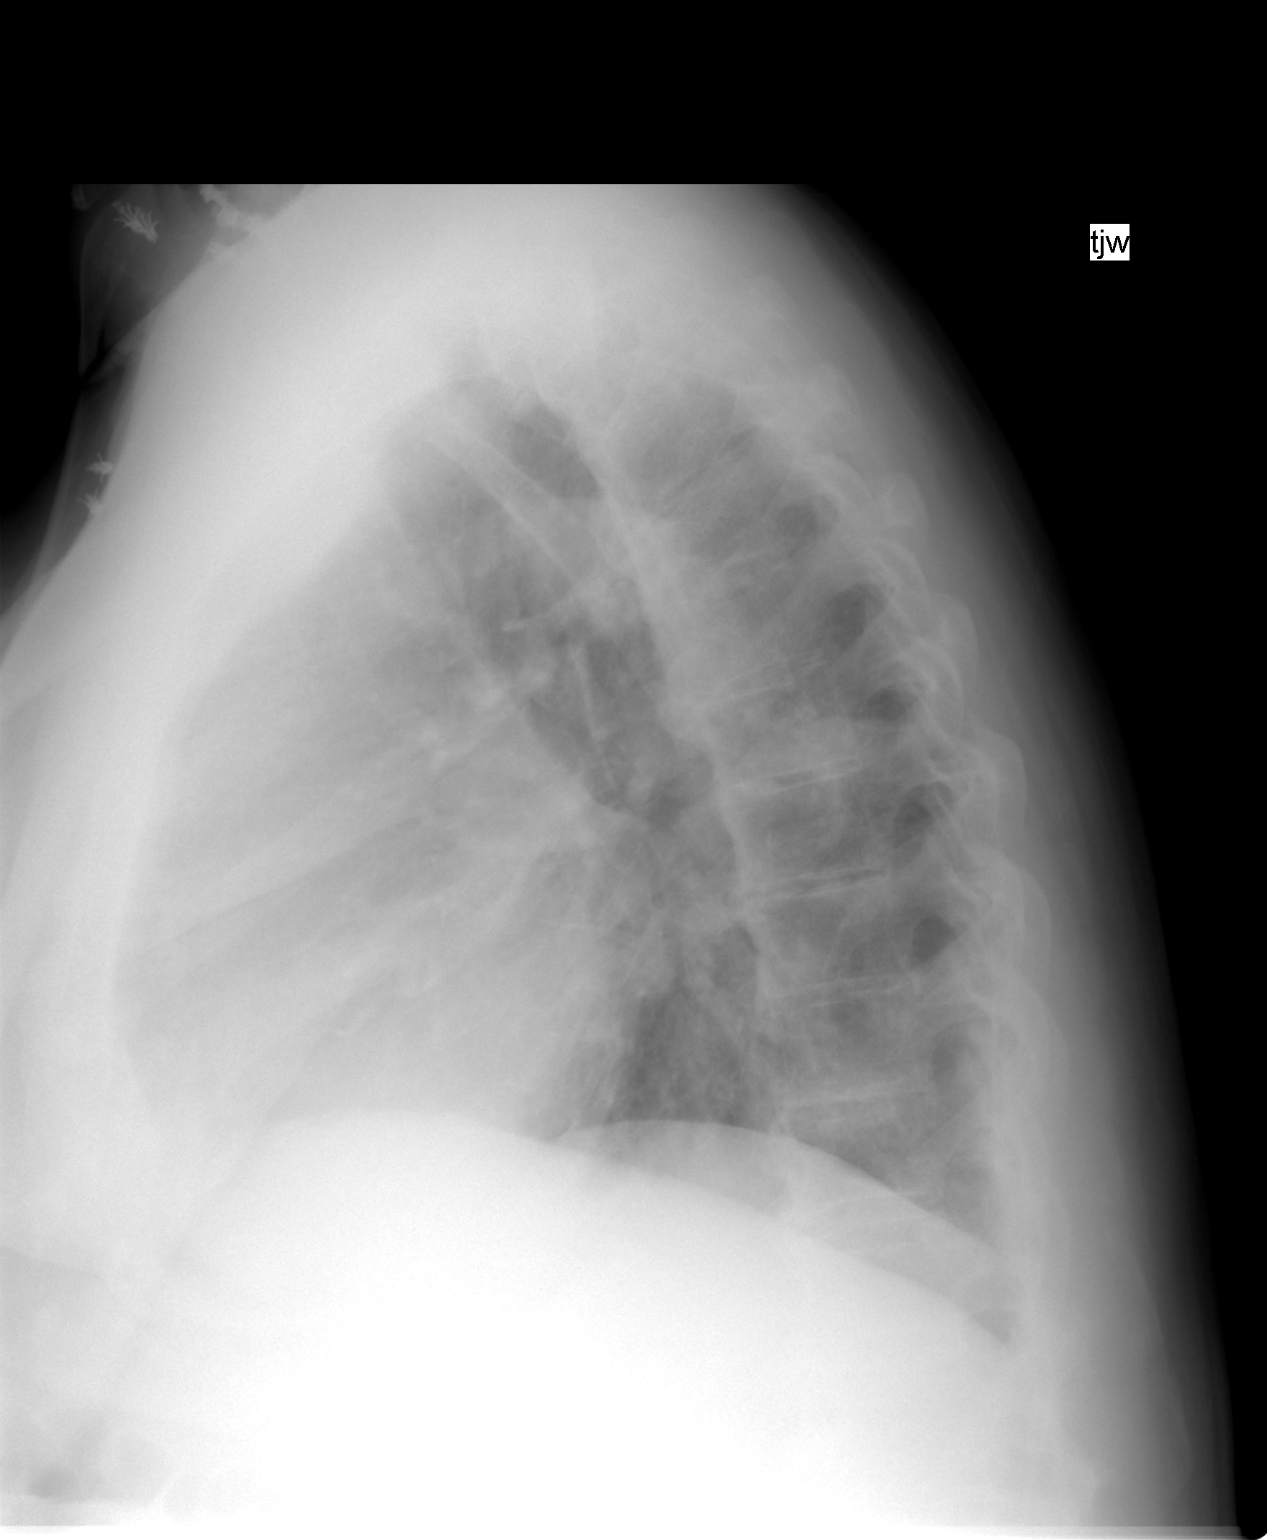

[2 of 2 positions shown; findings below may reference images not displayed]

FINDINGS: Slight rotation to the left.
Enlargement of cardiac silhouette.
Atherosclerotic calcification aortic arch.
Mediastinal contours and pulmonary vascularity normal.
Improvement in left perihilar infiltrate though slight prominence
of left hilum persists, question superimposed residual infiltrate.
Remaining lungs clear.
No pleural effusion or pneumothorax.
Broad-based dextroconvex thoracic scoliosis and scattered
degenerative disc disease changes again seen.
IMPRESSION: Enlargement cardiac silhouette.
Improved left perihilar infiltrate though persistent left
hilar/perihilar density remains; recommend additional follow-up
radiographs in 4-8 weeks to ensure resolution and exclude
underlying abnormalities.

## 2013-12-27 IMAGING — CR DG CHEST 2V
2 series · 2 of 2 positions shown · non-contrast
Comparison: 12/24/2011

CLINICAL DATA: Follow up on pneumonia.

CHEST - 2 VIEW

[view not recorded (1 of 2)]
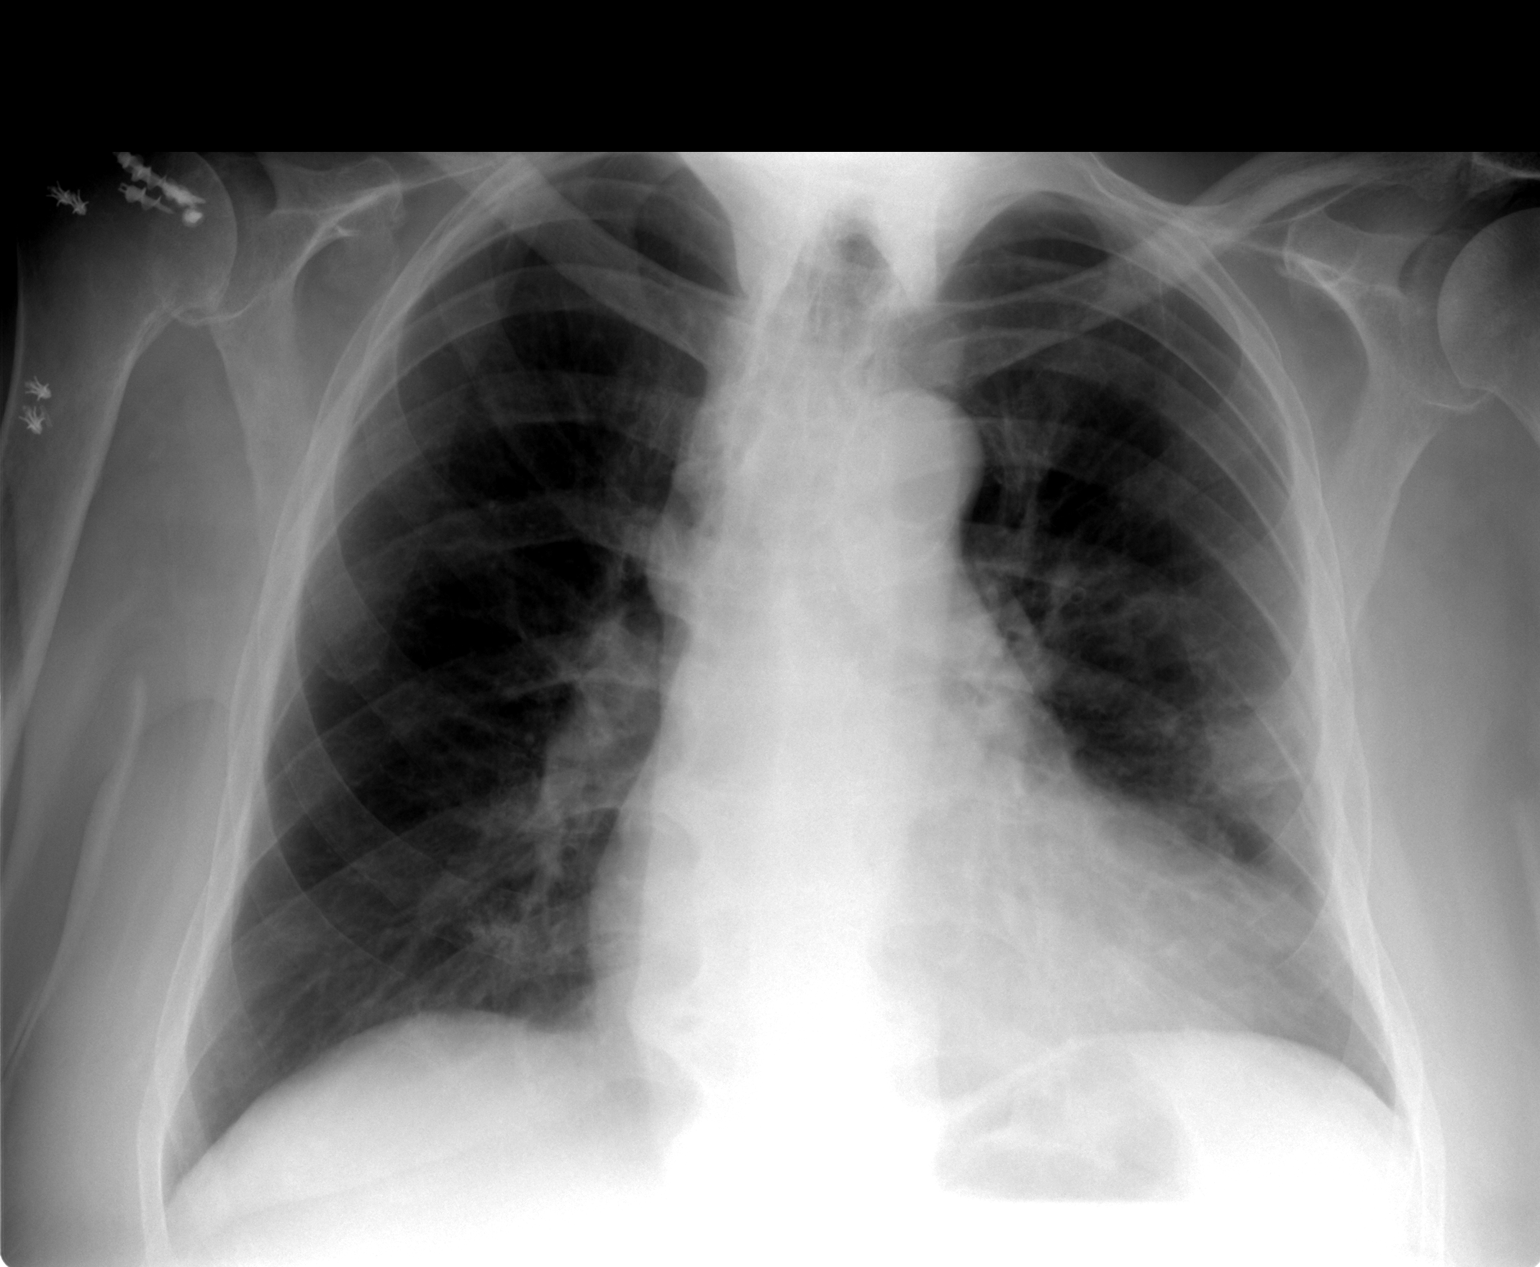

[view not recorded (2 of 2)]
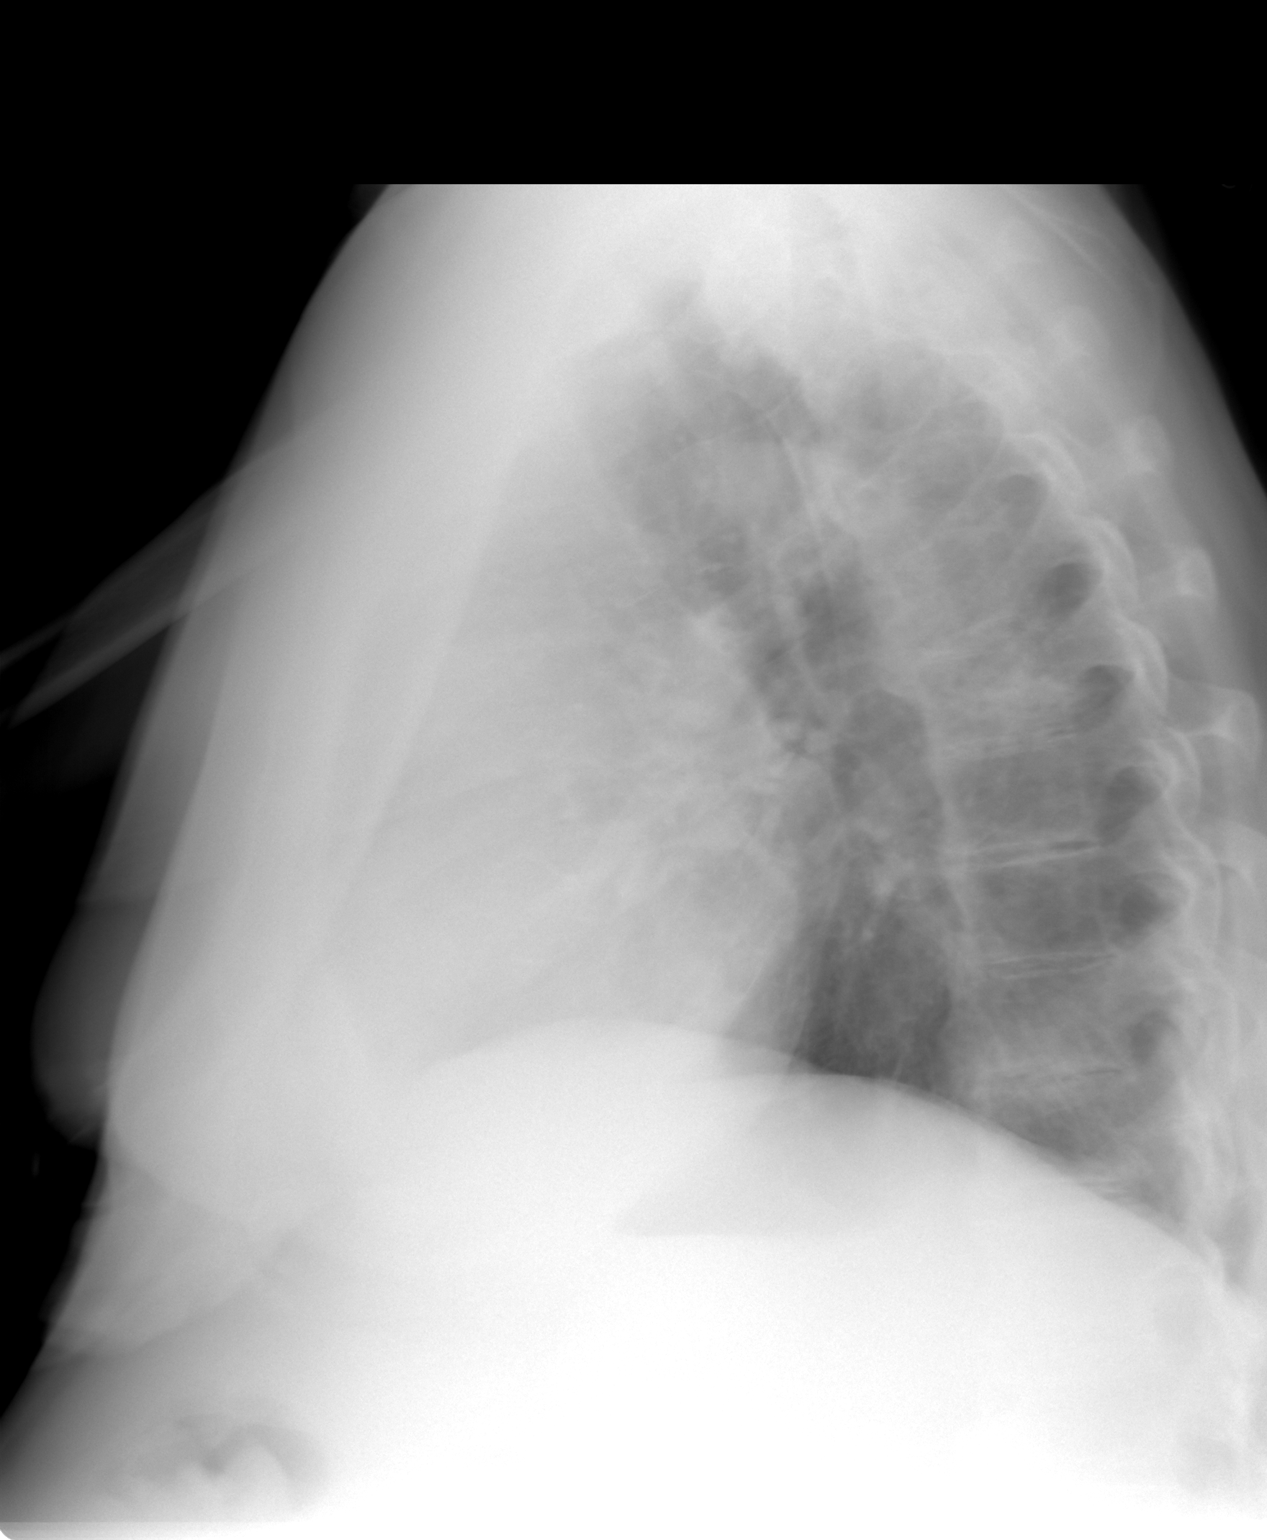

[2 of 2 positions shown; findings below may reference images not displayed]

FINDINGS: The heart is enlarged.  There is patchy infiltrate in the
left midlung zone and perihilar region, with increased lateral
component since previous exam.  Right lung remains clear.  No
edema.
IMPRESSION: Persistent left midlung zone infiltrate with more lateral component
compared to previous studies. Given the failure of this process to
resolve over more than 2 months, findings are concerning for
possible malignancy.  If the patient's symptoms are typical for
infection, consider one additional follow-up.  However, if the
patient is asymptomatic, a repeat CT would be recommended.

## 2014-02-20 IMAGING — CR DG CHEST 2V
2 series · 2 of 2 positions shown · non-contrast
Comparison: 02/10/2012

CLINICAL DATA: Follow up pneumonia

CHEST - 2 VIEW

[view not recorded (1 of 2)]
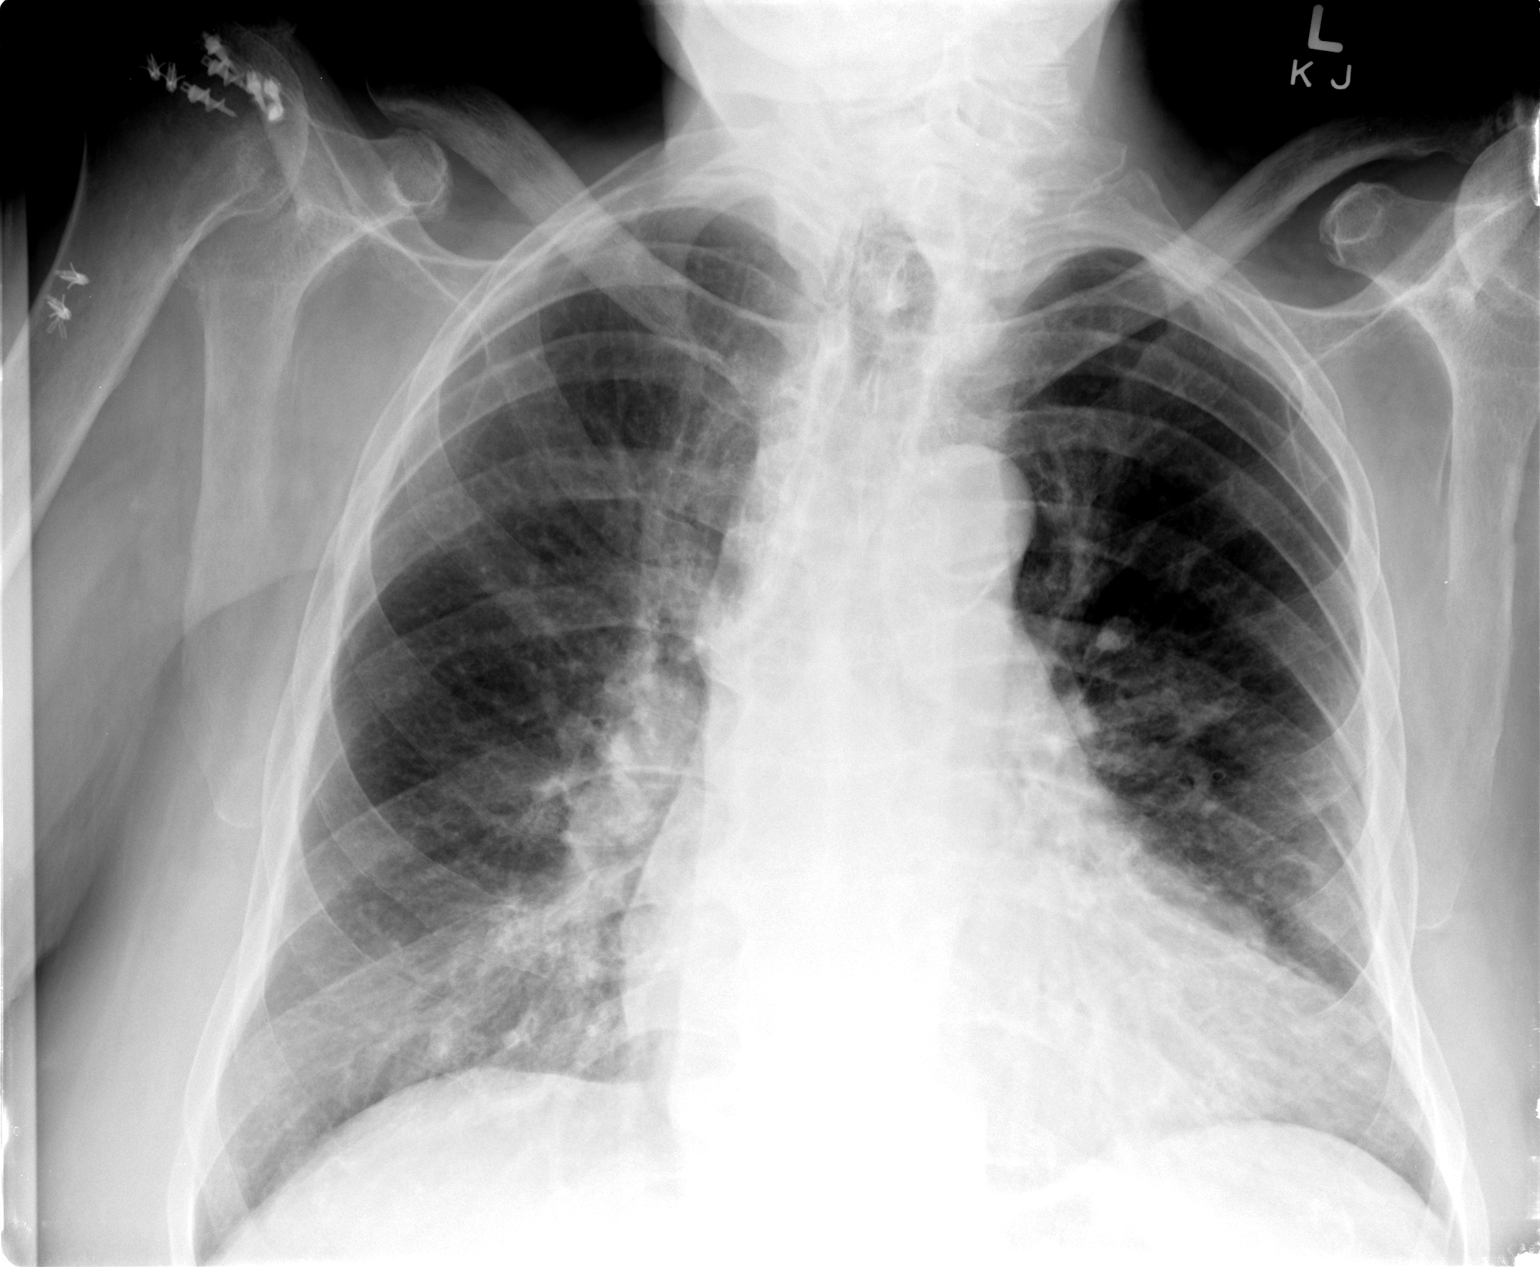

[view not recorded (2 of 2)]
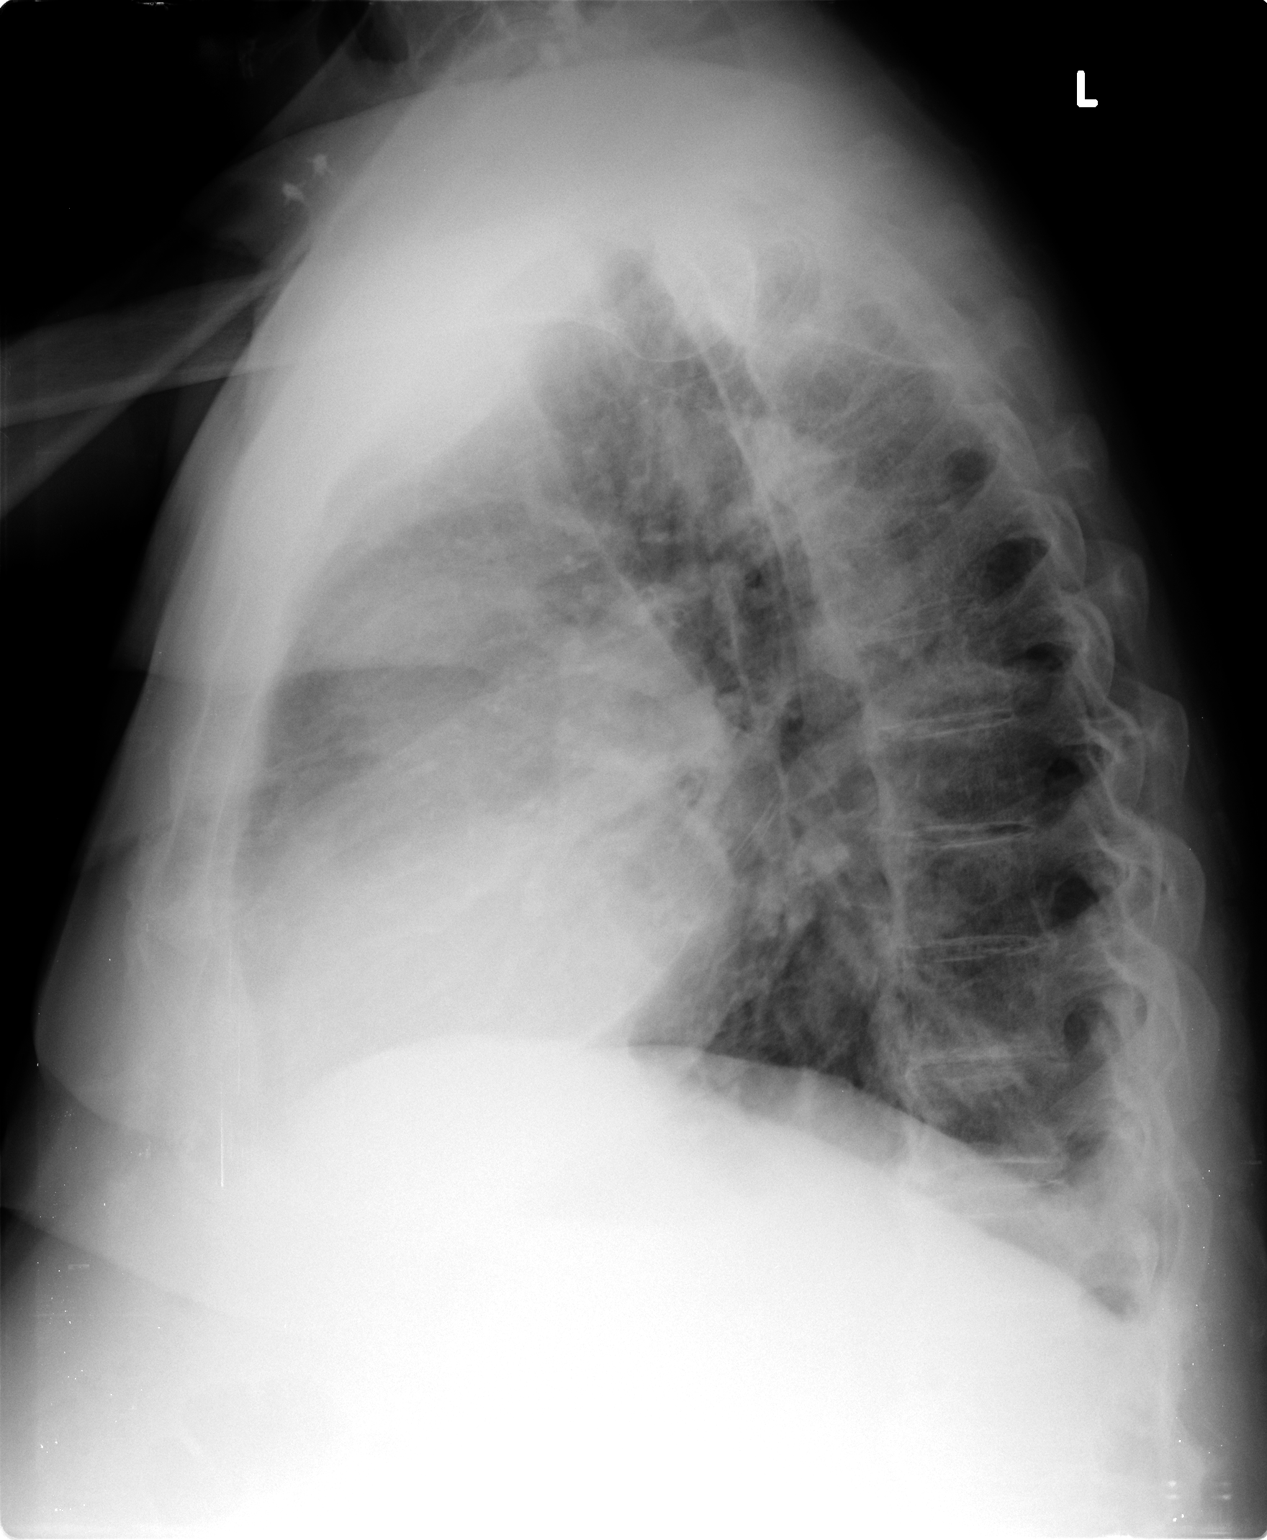

[2 of 2 positions shown; findings below may reference images not displayed]

FINDINGS: Enlargement of cardiac silhouette.
Atherosclerotic calcification aorta.
Mediastinal contours stable.
Peribronchial thickening and increased perihilar markings versus
previous study question perihilar infiltrate or edema.
No gross pleural effusion or pneumothorax.
Bones appear demineralized.
Prior right shoulder surgery.
Endplate spur formation thoracic spine with diffuse idiopathic
skeletal hyperostosis noted.
IMPRESSION: Enlargement of cardiac silhouette with pulmonary vascular
congestion.
Peribronchial thickening increased perihilar markings question
edema versus infection.

## 2014-09-20 NOTE — H&P (Signed)
PATIENT NAME:  GRACEN, SOUTHWELL MR#:  [id number] DATE OF BIRTH:  [birth year]  DATE OF ADMISSION:  10/20/2012  PRIMARY CARE PHYSICIAN:  Dr. Darrick Huntsman.  PRIMARY CARDIOLOGIST:  Dr. Mariah Milling.  NEPHROLOGIST:  Dr. Cherylann Ratel.  REFERRING PHYSICIAN:  Dr. Clemens Catholic.   CHIEF COMPLAINT:  Weakness.   HISTORY OF PRESENT ILLNESS:  The patient is a pleasant 81 year old Caucasian male with multiple comorbidities including paroxysmal a-fib, CKD, a history of a large cardio-embolic stroke, MC territory, with residual significant dysarthria and hemiparesis, currently on Coumadin, who presents with the above chief complaint. The patient currently is accompanied by his 24-hour caregiver, Ms. Maurice March. The information is obtained by the ER staff and her as there is no family currently here and the patient is in the process of divorce from his wife. The patient denies any complaints except shortness of breath and per his caregiver "never complaints". Over the last few days, he has had several falls secondary to increasing weakness. He apparently lost balance on a couple of occasions last 2 days and has had 2 falls. The caregiver check the blood pressure yesterday, it was 223/83. He complains of shortness of breath, increased fatigue, weakness. On arrival his blood pressure was 211/90 and blood pressures have been continuously elevated. The last blood pressure was 232/98. He appears to have some congestion on x-ray of the chest. Hospitalist services were contacted for evaluation and management.   PAST MEDICAL HISTORY:  Mild to moderate aortic stenosis, CKD, stage III, paroxysmal a-fib on Coumadin, a history of a large cardio-embolic stroke with profound dysarthria and hemiparesis, chronic lower extremity edema with likely chronic diastolic CHF as last echo cardiology notes showed normal ejection fraction. A history of right-sided renal artery stenosis with stent placement, diabetes, hypertension, chronic kidney disease,  hyperlipidemia, degenerative disk disease, bilateral degenerative joint disease in both knees status post right knee replacement, a history of osteomyelitis of the right lower extremity.  ALLERGIES:  AMLODIPINE, BETA BLOCKER, STATINS.   FAMILY HISTORY:  Per chart, mom with pancreatic cancer, grandfather with diabetes.   SOCIAL HISTORY:  In the process of divorce, denies alcohol or drinking, lives with a 24-hour caregiver, walks with a walker.   OUTPATIENT MEDICATIONS:  Acetaminophen/hydrocodone 325/10 mg 1 tab once a day as needed, alprazolam 0.5 mg 3 times a day as needed for anxiety, bupropion 150 mg extended-release 1 tab 2 times a day, cetirizine 10 mg daily, as needed, clonidine 0.1 mg 2 times a day, diazepam 5 mg at bedtime p.r.n., hydralazine 150 mg 4 times a day, isosorbide mononitrate 60 mg extended-release 1 tab once a day after supper and 1 tab at bedtime if elevated blood pressure as needed, ketoconazole topical cream once a day as needed, apply nitroglycerin 0.4 mg every 5 minutes as needed for chest pain, nystatin powder apply to affected areas 4 times a day, Paxil 10 mg 2 times a day, MiraLAX 17 grams daily as needed, triamcinolone 0.1% topical cream apply to affected areas as needed for rash, vitamin D 2000 units 2 tabs once a day, warfarin 3 mg daily on Saturdays and 6 mg on all other days,   REVIEW OF SYSTEMS:  The patient has significant dysarthria and has no complaints besides shortness of breath and review of systems obtaining is very limited, but, per caregiver, has had no fevers, has generalized weakness. The patient denies blurry vision. The patient complains of shortness of breath without a cough or wheezing. He has no chest pain. Has chronic lower extremity edema.  Denies nausea, vomiting, abdominal pain, blood in the urine or stool, has chronic anemia. Has chronic joint pains with right upper extremity weakness, a history of stroke and anxiety and depression.   PHYSICAL  EXAMINATION:  VITAL SIGNS:  Temperature on arrival 98.1, pulse 58, respiratory rate 20, blood pressure on arrival 211/90, last blood pressure was 232/198, oxygen saturation 96% on room air at 1:58.  GENERAL:  The patient is a chronically ill-appearing, pale Caucasian male lying in bed in no obvious distress.  HEENT:  Normocephalic, atraumatic. Pupils are equal and reactive. Anicteric sclerae. Extraocular muscles intact. Moist mucous membranes.  NECK:  Supple. No thyroid tenderness or cervical lymphadenopathy.  CARDIOVASCULAR:  S1, S2. There is a murmur, grade 3 to 4 in aortic region, appears to be regular.  LUNGS:  Bilateral crackles at the bases, more on the left than the right. Good air entry otherwise.  ABDOMEN:  Soft, nontender, nondistended. Positive bowel sounds in all quadrants.  EXTREMITIES:  Has 2+ lower extremity edema on the left. The patient has compression device on the right lower extremity for a chronic wound it appears which almost is healed. It is lateral above the right ankle with scar tissue formed. There is also a longitudinal lateral foot healed scar. No evidence of redness or cellulitis on the skin otherwise.  NEUROLOGIC:  Cranial nerves appear to be intact except for the significant dysarthria. Strength is 4+ on the right upper extremity, otherwise 5/5 all extremities. Sensation is intact to light touch.  PSYCHIATRIC:  Awake, alert, oriented, cooperative.   LABORATORY, DIAGNOSTIC, AND RADIOLOGICAL DATA:  Urine, trace leukocyte esterase, no nitrites, 4 WBC, no bacteria. INR 1.6, WBC 6.6, hemoglobin 11.6, platelets 308. Troponin negative. Albumin is 2.7, otherwise LFTs within normal limits. A BNP is 28,063, BUN 37, creatinine 1.81. EKG sinus brady, rate is 58, right bundle branch block. Also some T-wave inversions on V-I, V-II, with ST depressions on V-IV to V-IV. Of note, the ST downs were also seen on some previous EKGs on V-V, V-VI. CT of the head without contrast showed no  evidence of acute ischemic or hemorrhagic infarct. No intracranial mass effect. There is encephalomalacia in the superior aspect of the left sylvian fissure involving the posterior aspect of the left frontal lobe and anterior aspect of the left parietal lobe. A chest, PA and lateral x-ray, finding consistent with congestive heart failure with mild pulmonary interstitial edema.   ASSESSMENT AND PLAN:  This is an 81 year old pleasant Caucasian male with multiple comorbidities including a history of uncontrolled labile hypertension and diastolic congestive heart failure on Lasix twice weekly, per caregiver, who sees Dr. Mariah Milling, with significant dysarthria and paroxysmal atrial fibrillation with stroke MCA territory in 2012, chronic kidney disease, stage III, who presents with accelerated malignant hypertension with systolic blood pressures greater than 200s and acute on chronic diastolic congestive heart failure with some EKG abnormalities. At this point, we would admit the patient to the hospital. He does not have any chest pains and negative troponin and x 1. Would give the patient one-time hydralazine IV x 1 stat and put him on hydralazine p.r.n. IV in addition to hydralazine p.o. With his heart rate he cannot tolerates beta blockers and he is allergic to it. We would admit the patient to the hospital and telemetry unit, obtain a Cardiology consult with [facility], obtain an echocardiogram and start the patient on Lasix IV daily given his chronic kidney disease. We would start him on his Imdur, apply a nitroglycerin  patch and repeat an EKG in the morning. We would provide oxygen, check ins and outs and bring down the blood pressure, which I think is contributing to his shortness of breath and acute on chronic diastolic congestive heart failure. In regards to his weakness, this could be secondary to above as the blood pressure is an elevated, last checked by the caregiver yesterday, and I do not know how  long it has been elevated. We would obtain a Physical Therapy consul, control his congestive heart failure and see how he does. His chronic kidney disease, which is stage III, appears to be stable. In regards to his paroxysmal atrial fibrillation, we would continue the Coumadin and he is sinus currently. Of note, per caregiver, he has been also enrolled in Hospice for last 2 to 3 weeks at home. We would obtain a Palliative Care consult as the patient is still a FULL CODE to discuss with the patient goals of care. The patient appears to be declining, and with his multiple comorbidities, overall appears to have a poor long-term prognosis. Continue his anxiety and depression medications.   CRITICAL CARE TIME SPENT:  65 minutes.   CODE STATUS:  The patient is FULL CODE.   ____________________________ Krystal EatonShayiq Dariella Gillihan, MD sa:jm D: 10/20/2012 15:56:43 ET T: 10/20/2012 16:24:45 ET JOB#: [id number]  cc: Krystal EatonShayiq Aj Crunkleton, MD, <Dictator> Duncan Dulleresa Tullo, MD Antonieta Ibaimothy J. Gollan, MD Krystal EatonSHAYIQ Jovante Hammitt MD ELECTRONICALLY SIGNED 10/31/2012 [id number]:[id number]

## 2014-09-20 NOTE — Consult Note (Signed)
General Aspect 81 yo with history of paroxysmal atrial fibrillation, CKD,  mild to moderate aortic stenosis,  hospitalization for large cardioembolic stroke ([place] in 5/12 with a left MCA cardioembolic stroke),   treated with tPA, noted to be in atrial fibrillation  while in the hospital with residual profound expressive aphasia, now on coumadin,  with chronic diastolic CHF, chronic edema, severe labile hypertension, found to have renal artery stenosis,  status post stent placement. presenting with weakness, falls, SOB.  Family not at the bedside, he is unable to verbilize a good hx. Notes indicate falls, weakness.   Last seen in clinic 08/2012. At that time, BP was elevated, he was upset, possibly from pending divorce procedings with wife.  Family reported that he had been very difficult to manage secondary to his aggression, outbursts. Significant edema in the past, previously had ulcers on his right lower extremity requiring a wrap. Improvement by holding calcium channel blockers Underlying renal dysfunction with baseline creatinine in the 2s, followed by [facility] renal.  For BP meds,  he is currently taking clonidine BID, Cardura, hydralazine, isosorbide.   Present Illness . FAMILY HISTORY:  Per chart, mom with pancreatic cancer, grandfather with diabetes.   SOCIAL HISTORY:  In the process of divorce, denies alcohol or drinking, lives with a 24-hour caregiver, walks with a walker.   Physical Exam:  GEN well developed   HEENT hearing intact to voice, moist oral mucosa   NECK supple   RESP normal resp effort  clear BS   CARD Regular rate and rhythm  Murmur   Murmur Systolic   Systolic Murmur Out flow   ABD denies tenderness  soft   LYMPH negative neck   EXTR negative edema   SKIN normal to palpation   NEURO right side  decreased mobility   PSYCH alert, A+O to time, place, person, good insight   Review of Systems:  Subjective/Chief Complaint No complaints   General:  Weakness   Skin: No Complaints   ENT: No Complaints   Eyes: No Complaints   Neck: No Complaints   Respiratory: No Complaints   Cardiovascular: No Complaints   Gastrointestinal: No Complaints   Genitourinary: No Complaints   Vascular: No Complaints   Musculoskeletal: No Complaints   Neurologic: No Complaints   Hematologic: No Complaints   Endocrine: No Complaints   Psychiatric: No Complaints   Review of Systems: All other systems were reviewed and found to be negative   ROS challenging hx   Medications/Allergies Reviewed Medications/Allergies reviewed     right knee bone on bone: needs surg   Anemia: iron def   hypertension:    kidney disease:    stroke:    diabetes [place]:    hypercholesterolemia:    back surgery:        Admit Diagnosis:   CHF: Onset Date: 21-Oct-2012, Status: Active, Description: CHF  Home Medications: Medication Instructions Status  hydrALAZINE 100 mg oral tablet 1.5 tab(s) (150 mg) orally 4 times a day Active  alprazolam 0.5 mg oral tablet 1 tab(s) orally 3 times a day, As Needed for anxiety Active  buPROPion 150 mg/12 hours oral tablet, extended release 1 tab(s) orally 2 times a day Active  cetirizine 10 mg oral tablet 1 tab(s) orally once a day, As Needed for allergies Active  Vitamin D3 1000 intl units oral tablet 2 tab(s) (2000 units) orally once a day Active  diazepam 5 mg oral tablet 1 tab(s) orally once a day (at bedtime) Active  acetaminophen-hydrocodone 325 mg-10 mg oral tablet 1 tab(s) orally once a day, As Needed- for Pain  Active  isosorbide mononitrate 60 mg oral tablet, extended release 1 tab(s) orally once a day after supper and 1 tablet at bedtime (if hypertension occurs in the morning, take as needed) Active  ketoconazole topical 2% topical cream Apply topically to affected area once a day, As Needed for rash Active  nitroglycerin 0.4 mg sublingual tablet 1 tab(s) sublingual every 5 minutes, As Needed- for  Chest Pain  Active  nystatin topical 100000 units/g topical powder Apply topically to affected area 4 times a day Active  polyethylene glycol 3350 - oral powder for reconstitution 17 gram(s) orally once a day, As Needed- for Constipation  Active  triamcinolone topical 0.1% topical cream Apply topically to affected area once a day, As Needed for rash Active  warfarin 6 mg oral tablet 1 tab(s) orally once a day except on Saturday Active  warfarin 3 mg oral tablet 1 tab(s) orally once a day on Saturday Active  cloNIDine 0.1 mg oral tablet 1 tab(s) orally 2 times a day Active  doxazosin 4 mg oral tablet 1 tab(s) orally in the morning and 2 tabs at bedime Active  Paxil 10 mg oral tablet 1 tab(s) orally 2 times a day Active   Lab Results:  Thyroid:  24-May-14 03:28   Thyroid Stimulating Hormone 2.02 (0.45-4.50 (International Unit)  ----------------------- Pregnant patients have  different reference  ranges for TSH:  - - - - - - - - - -  Pregnant, first trimetser:  0.36 - 2.50 uIU/mL)  Routine Chem:  24-May-14 03:28   Glucose, Serum  104  BUN  35  Creatinine (comp)  1.96  Sodium, Serum 139  Potassium, Serum 4.3  Chloride, Serum  108  CO2, Serum 26  Calcium (Total), Serum 8.9  Anion Gap  5  Osmolality (calc) 286  eGFR (African American)  36  eGFR (Non-African American)  31 (eGFR values <27m/min/1.73 m2 may be an indication of chronic kidney disease (CKD). Calculated eGFR is useful in patients with stable renal function. The eGFR calculation will not be reliable in acutely ill patients when serum creatinine is changing rapidly. It is not useful in  patients on dialysis. The eGFR calculation may not be applicable topatients at the low and high extremes of body sizes, pregnant women, and vegetarians.)  Magnesium, Serum 1.8 (1.8-2.4 THERAPEUTIC RANGE: 4-7 mg/dL TOXIC: > 10 mg/dL  -----------------------)  Hemoglobin A1c ([facility]) 5.4 (The American Diabetes Association recommends that  a primary goal of therapy should be <7% and that physicians should reevaluate the treatment regimen in patients with HbA1c values consistently >8%.)  Cholesterol, Serum 152  Triglycerides, Serum 135  HDL (INHOUSE)  30  VLDL Cholesterol Calculated 27  LDL Cholesterol Calculated 95 (Result(s) reported on 21 Oct 2012 at 04:33AM.)  Cardiac:  24-May-14 03:28   CK, Total  30  CPK-MB, Serum 0.9 (Result(s) reported on 21 Oct 2012 at 04:36AM.)  Troponin I < 0.02 (0.00-0.05 0.05 ng/mL or less: NEGATIVE  Repeat testing in 3-6 hrs  if clinically indicated. >0.05 ng/mL: POTENTIAL  MYOCARDIAL INJURY. Repeat  testing in 3-6 hrs if  clinically indicated. NOTE: An increase or decrease  of 30% or more on serial  testing suggests a  clinically important change)  Routine Hem:  24-May-14 03:28   WBC (CBC) 7.2  RBC (CBC)  4.01  Hemoglobin (CBC)  11.7  Hematocrit (CBC)  34.6  Platelet Count (CBC) 295  MCV 86  MCH 29.2  MCHC 33.8  RDW  15.1  Neutrophil % 73.9  Lymphocyte % 13.7  Monocyte % 8.2  Eosinophil % 3.2  Basophil % 1.0  Neutrophil # 5.3  Lymphocyte # 1.0  Monocyte # 0.6  Eosinophil # 0.2  Basophil # 0.1 (Result(s) reported on 21 Oct 2012 at 04:27AM.)   EKG:  Interpretation EKG shows normal sinus rhythm with junctional escape rhythm   Radiology Results: XRay:    23-May-14 12:05, Chest PA and Lateral  Chest PA and Lateral   REASON FOR EXAM:    weakness  COMMENTS:       PROCEDURE: DXR - DXR CHEST PA (OR AP) AND LATERAL  - Oct 20 2012 12:05PM     RESULT: The cardiac silhouette is enlarged. The pulmonary vascularity is   prominent centrally. There are likely small amounts of pleural fluid   layering posteriorly. The mediastinum is normal in width. The pulmonary   interstitial markings are mildly increased.    IMPRESSION:  The findings are consistent with congestive heart failure   with mild pulmonary interstitial edema.     Dictation Site: 2    Verified By: DAVID A.  Martinique, M.D., MD  CT:    23-May-14 12:02, CT Head Without Contrast  CT Head Without Contrast   REASON FOR EXAM:    freq falls  COMMENTS:       PROCEDURE: CT  - CT HEAD WITHOUT CONTRAST  - Oct 20 2012 12:02PM     RESULT: Axial noncontrast CT scanning was performed through the brain   with reconstructions at 5 mm intervals and slice thicknesses.There are   no previous studies for comparison.    There is moderate diffuse cerebral and mild cerebellar atrophy. There is   mild compensatory ventriculomegaly. There is encephalomalacia within the   posterior frontal and anterior parietal lobes above the sylvian fissure.   There is decreased density in the deep white matter of both cerebral   hemispheres consistent with chronic small vessel ischemia. There are   punctate basal ganglia calcifications bilaterally. There is no evidence     of an acute intracranial hemorrhage nor of an evolving ischemic   infarction.    At bone window settings the observed portions of the paranasal sinuses   and mastoid air cells are clear. There is no evidence of an acute skull   fracture.    IMPRESSION:   1. There is no evidence of an acute ischemic or hemorrhagic infarction.  2. There is no intracranial mass effect or hydrocephalus. There is   moderate diffuse cerebral and mild cerebellar atrophy.  3. There is encephalomalacia along the superior aspect of the left   sylvian fissure involving the posterior aspect of the left frontal lobe   and anterior aspect of the left parietal lobe.   Dictation Site: 1        Verified By: DAVID A. Martinique, M.D., MD    Statins: Muscles aches  Amlodipine: Unknown  Beta Blockers: Unknown  Vital Signs/Nurse's Notes: **Vital Signs.:   24-May-14 07:49  Vital Signs Type Routine  Temperature Temperature (F) 98  Celsius 36.6  Temperature Source oral  Pulse Pulse 61  Respirations Respirations 18  Systolic BP Systolic BP 250  Diastolic BP (mmHg) Diastolic BP (mmHg)  75  Mean BP 109  Pulse Ox % Pulse Ox % 94  Pulse Ox Activity Level  At rest  Oxygen Delivery Room Air/ 21 %    Impression 82  yo with history of paroxysmal atrial fibrillation, CKD,  moderate aortic stenosis,  hospitalization for large cardioembolic stroke ([place] in 5/12 with a left MCA cardioembolic stroke),   treated with tPA, noted to be in atrial fibrillation  while in the hospital with residual profound expressive aphasia, now on coumadin,  with chronic diastolic CHF, chronic edema, severe labile hypertension, found to have renal artery stenosis,  status post stent placement. presenting with weakness, falls, SOB.  1) Malignant HTN long hx of difficult to control HTN long anxiety/stress component, take xanax at home (recent stressors with divorce proceedings have made it worse) --At home he is taking cardura and isosorbide. Will add these back to med list Will be interesting to monitor BP is setting of less stress. Suspect BP will be better controlled than what is reported in the clinic by family  2) Diastolic CHF appears euvolemic Renal function at baseline no edema, breathign appears comfortable --Would monitor creatinine,  at home he takes lasix PRN, not everyday  3) s/p CVA stable right side immobility,  profound expressive aphasia Needs assistance Family has indicated that he has been difficult to manage at home --Will likely need placement in SNF, with hospice  4) paroxysmal atrial fib maintaining NSR  Dispo: would discuss with family, will likely need SNF placement due to high demands on family, with hospice   Electronic Signatures: Ida Rogue (MD)  (Signed 24-May-14 12:48)  Authored: General Aspect/Present Illness, History and Physical Exam, Review of System, Past Medical History, Health Issues, Home Medications, Labs, EKG , Radiology, Allergies, Vital Signs/Nurse's Notes, Impression/Plan   Last Updated: 24-May-14 12:48 by Ida Rogue (MD)

## 2014-09-20 NOTE — H&P (Signed)
PATIENT NAME:  Scott EhlersFOSTER, Scott MR#:  [id number] DATE OF BIRTH:  1931-03-10  DATE OF ADMISSION:  11/16/2012  PRIMARY CARE PHYSICIAN:  Dr. Darrick Huntsmanullo  CARDIOLOGIST:  Dr. Mariah MillingGollan  NEPHROLOGIST:  Dr. Cherylann RatelLateef   CHIEF COMPLAINT:  Shortness of breath.   HISTORY OF PRESENT ILLNESS: This is an 81 year old male who is over at [facility]. He has been gaining weight, from 211 pounds up to 232 pounds. He is retaining fluid. He has 2 days of shortness of breath. He is not feeling well. His Lasix was twice a week, but had been held because of the increased creatinine. He has been having a poor appetite and tremor. In the ER, his BNP was 57,450. Creatinine elevated at 4.11, with a GFR of 13. Troponin was borderline at 0.07, INR up at 6.1. Chest x-ray showed severe multifocal bilateral reticular nodular infiltrate consistent with severe pulmonary edema. The patient was also placed on oxygen, and he did not wear oxygen before. His pulse ox is 95% on 2 liters. Hospitalist services were contacted for further evaluation.   PAST MEDICAL HISTORY: Moderate aortic stenosis, chronic kidney disease, paroxysmal atrial fibrillation, on Coumadin, CVA with dysarthria and hemiparesis, chronic lower extremity edema, chronic diastolic congestive heart failure, renal artery stenosis, diabetes, hypertension, hyperlipidemia, degenerative disk disease, chronic pain.   PAST SURGICAL HISTORY:  Right shoulder replacement, right total knee replacement, renal artery stent, amputation of a toe, and back surgery.   ALLERGIES:  AMLODIPINE, BETA BLOCKER and STATIN.   SOCIAL HISTORY:  No alcohol, smoking. Currently at [facility].   FAMILY HISTORY:  Father died at 7295 of old age. Mother died of pancreatic cancer in her 2780s. Brother with chronic kidney disease.   MEDICATIONS INCLUDE:  Cetirizine, which is Zyrtec 10 mg daily, clonidine 0.1 mg twice a day, Coumadin 7 mg daily, diazepam 5 mg at bedtime, doxazosin 4 mg, 1 tablet in the morning, 2  tablets at bedtime, hydralazine 150 mg 4 times a day, Imdur 90 mg daily, ketoconazole 2% cream to affected area daily, Lasix 40 mg twice a week on Monday and Thursday, but was held recently, minoxidil 2.5 mg 2 tablets twice a day for hypertension, MiraLAX 17 grams in 8 ounces of water daily, Norco 5/325 once a day, Nystatin powder daily, Paxil 10 mg twice a day, triamcinolone cream p.r.n., Wellbutrin SR 150 mg twice a day, Xanax 0.5 mg every 8 hours as needed for anxiety, vitamin D3, 2000 international units daily.   REVIEW OF SYSTEMS:   CONSTITUTIONAL:  Positive for cold feeling. No fever or chills. Positive for weight gain, from 211 to 232. Positive for fatigue.  EYES:  He does wear reading glasses.  EARS, NOSE, MOUTH AND THROAT: Positive for runny nose. Positive for dysphagia to bread.  CARDIOVASCULAR:  Positive for chest pain. No palpitations.  RESPIRATORY:  Positive for shortness of breath. No cough. No sputum. No hemoptysis.  GASTROINTESTINAL: Positive for abdominal pain and distention and constipation. No nausea or vomiting.  GENITOURINARY: Very little urination. No hematuria.  MUSCULOSKELETAL: Chronic pain.  NEUROLOGICAL: Unsteady gait.  PSYCHIATRIC:  On medication for depression.  ENDOCRINE: No thyroid problems.  HEMATOLOGIC/LYMPHATIC:  History of anemia.   PHYSICAL EXAMINATION: VITAL SIGNS: Temperature 97.6, pulse 64, respirations 18, blood pressure 146/49, pulse ox 95% on oxygen.  GENERAL:  Positive respiratory distress, using abdominal muscles to breathe.  EYES: Conjunctivae and lids normal. Pupils equal, round and reactive to light. Extraocular muscles intact. No nystagmus.  EARS, NOSE, MOUTH AND THROAT: Tympanic  membranes: No erythema. Nasal mucosa: No erythema. Throat:  No erythema. No exudate seen. Lips and gums:  No lesions.  NECK:  Positive for JVD. No bruits. No lymphadenopathy. No thyromegaly. No thyroid nodules palpated.  RESPIRATORY:  Positive use of accessory muscles to  breathe. Positive rales halfway up lung field.  CARDIOVASCULAR SYSTEM: S1, S2. A soft 2/6 systolic ejection murmur. Carotid upstroke 2+ bilaterally. No bruits. Dorsalis pedis pulses 1+ bilaterally, 4+ edema bilateral lower extremities.  ABDOMEN: Soft, nontender. No organomegaly/splenomegaly. Normoactive bowel sounds. No masses felt.  LYMPHATIC: No lymph nodes in the neck.  MUSCULOSKELETAL: There is 4+ edema. No clubbing. No cyanosis.  SKIN:  No rashes or ulcers seen anteriorly.  NEUROLOGIC: Cranial nerves II through XII grossly intact. Some slurred speech, though, which is chronic. Deep tendon reflexes 1/2+ bilateral lower extremities.  PSYCHIATRIC: The patient is oriented to person, place and time.   LABORATORY AND RADIOLOGICAL DATA:  EKG showed sinus arrhythmia, first-degree AV block, right bundle branch block. Chest x-ray showed severe multifocal bilateral diffuse reticular nodule infiltrate consistent with severe pulmonary edema versus severe bilateral pneumonia or evolving ARDS. CT scan of the head:  No acute intracranial process. INR 6.1. Troponin 0.07. White blood cell count 6.4, H and H 9.6 and 28.8, platelet count of 234. Glucose 129, BUN 84, creatinine 4.11, sodium 133, potassium 5.1, chloride 104, CO2 of 22, calcium 8.7. Liver function tests normal range, except for albumin at 3.0. GFR of 13. BNP 57,450.    ASSESSMENT AND PLAN: 1. Acute respiratory failure requiring oxygen. Pulse oximetry only 95%. Continue oxygen supplementation.   2.  Acute diastolic congestive heart failure, with moderate aortic stenosis and anasarca, with weight gain of 20+ pounds. Will give Lasix, another 80 mg IV stat, only received 40 mg IV in the ER, and then will start on a Lasix drip 10 mg per hour and continue to monitor Is and Os. Put a Foley catheter in. Case discussed with Dr. Thedore Mins, Nephrology. The patient has an allergy to BETA BLOCKER. The patient is on hydralazine and Imdur. I will also stop the minoxidil,  which could cause lower extremity edema and anasarca.   3.  Acute renal failure, on chronic kidney disease. Need to watch with diuresis. I did speak with the patient about possible need for dialysis. They are not sure if they would want to go that route or not. Dr. Thedore Mins, Nephrology, to speak with the family and patient tomorrow.   4.  Hypertension. Continue to monitor on his usual medications, except for the minoxidil. Will decrease the dose of the doxazosin also.   5.  Atrial fibrillation, with coagulopathy. Hold the Coumadin, since the INR is 6.1. The patient is rate controlled.   6.  Elevated cardiac enzymes, likely secondary to the acute respiratory failure and heart failure.   7.  Constipation. Continue p.r.n. medications.   8.  Chronic pain. Continue the pain medications.   I did discuss the case with Dr. Harvie Junior, Palliative Care, who did offer hospice home. The family was not ready to make that decision just yet. The patient is a DO NOT RESUSCITATE. I am not sure if they will go towards dialysis at this point, even if needed. I did start the Lasix drip in order to make the patient feel better with regards to his breathing, and will just have to watch the kidney function closely.    Time spent on admission:  60 minutes critical care time.    ____________________________ Gerlene Burdock  Jeanella Anton, MD rjw:mr D: 11/16/2012 18:49:41 ET T: 11/16/2012 19:03:49 ET JOB#: [id number]  cc: Herschell Dimes. Renae Gloss, MD, <Dictator> Duncan Dull, MD Antonieta Iba, MD Munsoor Lizabeth Leyden, MD    Salley Scarlet MD ELECTRONICALLY SIGNED 11/27/2012 15:40

## 2014-09-20 NOTE — Discharge Summary (Signed)
PATIENT NAME:  Scott Norman, Scott Norman MR#:  [id number] DATE OF BIRTH:  August 19, 1930  DATE OF ADMISSION:  11/16/2012 DATE OF DISCHARGE:  11/22/2012  CONSULTANTS:  1.  Dr. Harvie JuniorPhifer from palliative care.  2.  Dr. Thedore MinsSingh, Dr. Cherylann RatelLateef  and Dr. Wynelle LinkKolluru from nephrology.  3.  Dr. Mariah MillingGollan and Dr. Kirke CorinArida from cardiology.   PRIMARY CARE PHYSICIAN: Duncan Dulleresa Tullo, MD   CHIEF COMPLAINT: Shortness of breath.   DISCHARGE DIAGNOSES: 1.  Acute respiratory failure secondary to acute on chronic systolic and diastolic congestive heart failure with cardiorenal syndrome, with moderate aortic stenosis and anasarca, ejection fraction of 45 to 50%.  2.  Acute on chronic renal failure.  3.  Hypertension.  4.  Paroxysmal atrial fibrillation/flutter.  5.  Elevated cardiac enzymes, likely secondary to respiratory failure and heart failure.  6.  Constipation.  7.  Chronic pain.  8.  History of stroke with dysarthria and hemiparesis.  9.  Chronic lower extremity edema.  10.  History of renal artery stenosis.  11.  Diabetes. 12.  Hyperlipidemia.   DISPOSITION:  The patient will be getting [facility] and end of life care with a Foley catheter, 2 liters of continuous oxygen.   DISCHARGE MEDICATIONS:  Wellbutrin 150 mg 1 tab 2 times a day extended-release, MiraLax 17 grams once a day as needed, acetaminophen/hydrocodone 325/5 mg every 4 hours as needed, clonidine 0.1 mg 2 times a day, doxazosin 4 mg in the morning and 2 tabs at bedtime for hypertension, isosorbide mononitrate 90 mg once a day, diazepam 5 mg daily, Xanax 0.5 mg every 8 hours as needed, hydralazine 50 mg please take 3 tabs 4 times a day, Lasix 40 mg daily for CHF, and please take an extra tablet in the afternoons for weight gain greater than 5 pounds or shortness of breath, Coumadin 2 mg once a day, please (Dictation Anomaly) check INR.   CODE STATUS:  The patient is DNR.    HISTORY OF PRESENT ILLNESS: For full details of the H and P, please  see the dictation on 06/19 by Dr. Renae GlossWieting; but, briefly, this is a pleasant 81 year old gentleman who was brought in from [facility] for a weight gain of about 20 or so pounds,  not feeling well, elevated BNP, shortness of breath, acute on chronic renal failure with an elevated creatinine and GFR of 13.   HOSPITAL COURSE:  He was admitted to the hospitalist service on telemetry, and Cardiology and Renal were consulted as well as Palliative Care. He was started on Lasix IV and then transitioned into a drip. His minoxidil was held as it could have contributed to lower extremity edema and anasarca. The patient was in florid CHF with significant fluid retention and edema and also did have elevated INR, and his Coumadin was held. His INR level was 6.1. He was noted to be in probable cardiorenal syndrome, however, did relatively well with Lasix drip with a decrease in creatinine and improvement in GFR; however, overall he has not had significant improvement. He has improved shortness of breath and today did not have any complaints of shortness of breath, but overall he has a very poor prognosis, and Palliative Care has been on board; and after discussing with the patient's family, he will be discharged to [facility] for palliation. Given history of stroke, dysarthria and hemiparesis, he will be discharged on a lower dose of Coumadin such that INR will not be checked, but it might provide additional protection from  developing another stroke. This was okayed by Cardiology, and at this point he will be discharged to the [facility].   TOTAL TIME SPENT: 35 minutes.   ____________________________ Krystal Eaton, MD sa:cb D: 11/22/2012 13:54:43 ET T: 11/22/2012 14:09:12 ET JOB#: [id number]  cc: Krystal Eaton, MD, <Dictator> Duncan Dull, MD Krystal Eaton MD ELECTRONICALLY SIGNED 12/08/2012 14:58

## 2014-09-20 NOTE — Discharge Summary (Signed)
PATIENT NAME:  Scott Norman, Scott Norman MR#:  [id number] DATE OF BIRTH:  1930-12-06  DATE OF ADMISSION:  10/20/2012 DATE OF DISCHARGE:  10/24/2012  ADMITTING DIAGNOSES: Weakness, shortness of breath, accelerated blood pressure.   DISCHARGE DIAGNOSES:  1.  Shortness of breath due to acute diastolic congestive heart failure; now his symptoms have improved.  2.  Accelerated hypertension, with poor control: I have discussed with his cardiologist who follows the patient as an outpatient his blood pressure has been extremely hard to control and he usually runs in the 170s, 180s. He has had evaluation for his history of renal artery stenosis, and  I was that his stents were patent. THE PATIENT IS UNABLE TO TAKE BETA BLOCKERS. HE CANNOT TAKE AN ACE AND ARB DUE TO HIS RENAL DYSFUNCTION. The patient also has had issues with too much clonidine, with his heart rate dropping and unable to take beta blockers due to a low heart rate.  3.  Generalized weakness and severe deconditioning: The patient is being arranged for discharge to rehabilitation.  4.  Chronic kidney disease, stage III, currently stable.  5.  Paroxysmal atrial fibrillation, on chronic Coumadin.  6.  History of moderate-to-severe aortic stenosis.  7.  History of a large cardioembolic cerebrovascular accident, with history of dysarthria and hemiparesis.  8.  Chronic lower extremity edema.  9.  History of right-sided renal artery stenosis, with stent placement.  10.   Diabetes.  11.  Hyperlipidemia.  12.  Degenerative disk disease.  13.  Bilateral degenerative joint disease of both knees.  14. Status post right knee replacement.  15. History of osteomyelitis of the right lower extremity.   CONSULTANTS: Cardiology, palliative care.   PERTINENT LABS AND EVALUATIONS: Admitting glucose 102, BUN 37, creatinine 1.81. Sodium 140, potassium 4.5, chloride 110, CO2 is 24. His iron level was 34, iron binding 249, iron saturation 14, calcium 8.9.   His LFTs  showed albumin at 2.7. Troponin was less than 0.02 x 3.   Admitting WBC count 7.6, hemoglobin 11.4, platelet count was 281. TSH 2.02, INR 1.5.   Echocardiogram of the heart showed LVEF of 45% to 50%, diastolic filling dysfunction, normal right ventricular size and function, moderate aortic valve stenosis.   HOSPITAL COURSE: Please refer to H and P done by the admitting physician. The patient is an 81 year old white male with multiple medical problems, poor blood pressure control as an outpatient, who was brought with generalized weakness and also shortness of breath. The patient's blood pressure was noted to be 223/83. His cardiologist, Dr. Mariah MillingGollan, reported that patient has chronic difficulties with elevated blood pressure and has been very hard to control, AND ONLY LIMITED MEDICATIONS ARE USED DUE TO HIM HAVING PROBLEMS WITH OTHER BLOOD PRESSURE MEDICATIONS.   The patient was admitted and also was noted to have acute diastolic CHF. He was diuresed, with improvement in his shortness of breath. His blood pressure, as stated, has been difficult to control. This is similar to as home, so this has not been unchanged. He does have a history of renal artery stenosis and that has been evaluated. He has a previous stent that was patent, per  Dr. Mariah MillingGollan. The patient was seen by palliative care. HE IS A DO NOT RESUSCITATE.   Due to his severe weakness, arrangements have been made for him to be discharged to a skilled nursing facility/rehab.   DISCHARGE MEDICATIONS: Hydralazine 150, 1 tablet 4 times a day, alprazolam 0.5, 3 times a day as needed for anxiety, bupropion  150, 1 tablet p.o. b.i.d., cetirizine 10 daily, vitamin D3, 2000 international units daily, diazepam 5 mg at bedtime, acetaminophen/hydrocodone, 325/10 daily as needed for pain, ketoconazole topically 2%, apply to affected area daily as needed for rash, Nystatin, apply topically 4 times a day as needed, MiraLAX 17 grams daily as needed for  constipation, triamcinolone topically, 0.1%, apply to affected area as needed for rash, Coumadin 6 mg except Saturdays. He is to receive 3 mg on Saturday; clonidine 0.1, 1 tablet p.o. b.i.d., doxazosin 4 mg 1 tablet in the morning, 2 tablets at bedtime, Paxil 10 daily, isosorbide nitrate  90 mg daily, Lasix 20, 1 tablet p.o. b.i.d. as needed for swelling, minoxidil 5 mg b.i.d.   DIET: Low sodium, low fat, low cholesterol.   ACTIVITY: As tolerated.   FOLLOWUP: With primary M.D. in 1 to 2 weeks, Dr. Mariah Milling in 1 to 2 weeks. Please check INR on a regular basis at the facility.    Note: Forty minutes spent on the discharge.      ____________________________ Lacie Scotts. Allena Katz, MD shp:dm D: 10/24/2012 09:25:22 ET T: 10/24/2012 09:33:35 ET JOB#: [id number]  cc: Megan Hayduk H. Allena Katz, MD, <Dictator> Charise Carwin MD ELECTRONICALLY SIGNED 10/26/2012 14:55

## 2014-09-20 NOTE — Consult Note (Signed)
General Aspect 81 yo with history of paroxysmal atrial fibrillation, CKD,  mild to moderate aortic stenosis,  hospitalization for large cardioembolic stroke ( in 5/12 with a left MCA cardioembolic stroke),   treated with tPA, noted to be in atrial fibrillation  while in the hospital with residual profound expressive aphasia, now on coumadin,  with chronic systolic/diastolic CHF, chronic edema, severe labile hypertension, found to have renal artery stenosis,  status post stent placement in 2013 (was noted to be patent on renal artery Duplex in 04/14). presenting with 20 lbs weight gain with anasarca and worsening renal function. He does complain of sypnea but no chest pain.  He is still at [facility]. Still very deconditioned.  He was taken off diuretics recently due to worsening renal function.   Present Illness . FAMILY HISTORY:  Per chart, mom with pancreatic cancer, grandfather with diabetes.   SOCIAL HISTORY:  In the process of divorce, denies alcohol or drinking, lives with a 24-hour caregiver, walks with a walker.   Physical Exam:  GEN critically ill appearing   HEENT hearing intact to voice, moist oral mucosa   NECK supple  ++ JVD   RESP postive use of accessory muscles  crackles  at base with diminished breath sounds   CARD Regular rate and rhythm  Murmur   Murmur Systolic   Systolic Murmur Out flow   ABD denies tenderness  distended  + ascites   LYMPH negative neck   EXTR positive edema, +1 in arms and legs   SKIN normal to palpation   NEURO right side  decreased mobility   PSYCH alert, A+O to time, place, person, good insight   Review of Systems:  Subjective/Chief Complaint weight gain and edema   General: Weakness   Skin: No Complaints   ENT: No Complaints   Eyes: No Complaints   Neck: No Complaints   Respiratory: Short of breath   Cardiovascular: Dyspnea  Orthopnea  Edema   Gastrointestinal: No Complaints   Genitourinary: No  Complaints   Vascular: No Complaints   Musculoskeletal: No Complaints   Neurologic: No Complaints   Hematologic: No Complaints   Endocrine: No Complaints   Psychiatric: No Complaints   Review of Systems: All other systems were reviewed and found to be negative   ROS challenging hx   Medications/Allergies Reviewed Medications/Allergies reviewed   Home Medications: Medication Instructions Status  cetirizine 10 mg oral tablet 1 tab(s) orally once a day, As Needed for seasonal allergies Active  Vitamin D3 1000 intl units oral tablet 2 tab(s) (2000 units) orally once a day for supplement Active  diazepam 5 mg oral tablet 1 tab(s) orally once a day (at bedtime) for anxiety Active  ketoconazole topical 2% topical cream Apply topically to affected area once a day, As Needed for rash Active  nystatin topical 100000 units/g topical powder Apply topically to affected area 4 times a day for rash Active  triamcinolone topical 0.1% topical cream Apply topically to affected area once a day, As Needed for rash Active  cloNIDine 0.1 mg oral tablet 1 tab(s) orally 2 times a day for hypertension Active  Paxil 10 mg oral tablet 1 tab(s) orally 2 times a day for depression Active  hydrALAZINE 100 mg oral tablet 1.5 tab(s) (150 mg) orally 4 times a day for hypertension Active  minoxidil 2.5 mg oral tablet 2 tab(s) (5 mg) orally 2 times a day for hypertension Active  Lasix 40 mg oral tablet 1 tab(s) orally 2  times a week on Monday and Thursday for edema (on hold starting 11/16/12) Active  Coumadin 7 milligram(s) orally once a day Active  Norco 325 mg-5 mg oral tablet 1 tab(s) orally once a day, As Needed - for Pain (no more than 3 grams apap/day from all sources) Active  Imdur 30 mg oral tablet, extended release 3 tab(s) orally once a day for hypertension Active  doxazosin 4 mg oral tablet 1 tab(s) orally once a day (in the morning) and 2 tabets at bedtime for hypertension Active  Xanax 0.5 mg oral  tablet 1 tab(s) orally every 8 hours, As Needed for anxiety Active  Wellbutrin SR 150 mg/12 hours oral tablet, extended release 1 tab(s) orally 2 times a day for depression Active  MiraLax - oral powder for reconstitution 17 gram(s) in 8 ounces of water orally once a day, As Needed - for Constipation Active   Lab Results: Routine Chem:  20-Jun-14 00:51   Result Comment TROPONIN - RESULTS VERIFIED BY REPEAT TESTING.  - PREV. C/ 11-16-12 _0  BY [facility]K. AJO  Result(s) reported on 17 Nov 2012 at 04:05AM.  Result Comment INR - RESULTS VERIFIED BY REPEAT TESTING.  - NOTIFIED OF CRITICAL VALUE  - C/ ANN WILLIAMS _1  11-17-12. AJO  - READ-BACK PROCESS PERFORMED.  Result(s) reported on 17 Nov 2012 at 01:35AM.  Magnesium, Serum  3.0 (1.8-2.4 THERAPEUTIC RANGE: 4-7 mg/dL TOXIC: > 10 mg/dL  -----------------------)  Glucose, Serum  100  BUN  90  Creatinine (comp)  4.13  Sodium, Serum 136  Potassium, Serum 4.7  Chloride, Serum 105  CO2, Serum 21  Calcium (Total), Serum 8.7  Anion Gap 10  Osmolality (calc) 300  eGFR (African American)  15  eGFR (Non-African American)  13 (eGFR values <55m/min/1.73 m2 may be an indication of chronic kidney disease (CKD). Calculated eGFR is useful in patients with stable renal function. The eGFR calculation will not be reliable in acutely ill patients when serum creatinine is changing rapidly. It is not useful in  patients on dialysis. The eGFR calculation may not be applicable to patients at the low and high extremes of body sizes, pregnant women, and vegetarians.)    07:56   Result Comment TROPONIN - RESULTS VERIFIED BY REPEAT TESTING.  - RESULT CALLED PREVIOUSLY 11/16/12 AT  - 1713 BY [facility]K-[facility]  Result(s) reported on 17 Nov 2012 at 08:50AM.  Cardiac:  20-Jun-14 00:51   Troponin I  0.07 (0.00-0.05 0.05 ng/mL or less: NEGATIVE  Repeat testing in 3-6 hrs  if clinically indicated. >0.05 ng/mL: POTENTIAL  MYOCARDIAL INJURY. Repeat  testing in 3-6 hrs  if  clinically indicated. NOTE: An increase or decrease  of 30% or more on serial  testing suggests a  clinically important change)    07:56   Troponin I  0.07 (0.00-0.05 0.05 ng/mL or less: NEGATIVE  Repeat testing in 3-6 hrs  if clinically indicated. >0.05 ng/mL: POTENTIAL  MYOCARDIAL INJURY. Repeat  testing in 3-6 hrs if  clinically indicated. NOTE: An increase or decrease  of 30% or more on serial  testing suggests a  clinically important change)  Routine Coag:  20-Jun-14 00:51   Prothrombin  48.5  INR  5.7 (INR reference interval applies to patients on anticoagulant therapy. A single INR therapeutic range for coumarins is not optimal for all indications; however, the suggested range for most indications is 2.0 - 3.0. Exceptions to the INR Reference Range may include: Prosthetic heart valves, acute myocardial infarction, prevention of myocardial infarction, and  combinations of aspirin and anticoagulant. The need for a higher or lower target INR must be assessed individually. Reference: The Pharmacology and Management of the Vitamin K  antagonists: the seventh ACCP Conference on Antithrombotic and Thrombolytic Therapy. GLOVF.6433 Sept:126 (3suppl): [id number]. A HCT value >55% may artifactually increase the PT.  In one study,  the increase was an average of 25%. Reference:  "Effect on Routine and Special Coagulation Testing Values of Citrate Anticoagulant Adjustment in Patients with High HCT Values." American Journal of Clinical Pathology 2006;126:400-405.)  Routine Hem:  20-Jun-14 00:51   WBC (CBC) 6.3  RBC (CBC)  3.15  Hemoglobin (CBC)  9.1  Hematocrit (CBC)  27.3  Platelet Count (CBC) 220  MCV 87  MCH 28.8  MCHC 33.3  RDW  15.3  Neutrophil % 76.1  Lymphocyte % 10.9  Monocyte % 10.6  Eosinophil % 1.9  Basophil % 0.5  Neutrophil # 4.8  Lymphocyte #  0.7  Monocyte # 0.7  Eosinophil # 0.1  Basophil # 0.0 (Result(s) reported on 17 Nov 2012 at 01:28AM.)    EKG:  EKG NSR  IVCD, ist degree AVB   Radiology Results: XRay:    19-Jun-14 14:42, Chest Portable Single View  Chest Portable Single View   REASON FOR EXAM:    Chest pain  COMMENTS:       PROCEDURE: DXR - DXR PORTABLE CHEST SINGLE VIEW  - Nov 16 2012  2:42PM     RESULT: Comparison is made to study of 10/20/2012. There is diffuse   interstitial attending consistent with severe pulmonary edema versus   severe bilateral pneumonia or evolving ARDS. Correlate clinically. The   heart appears enlarged but is difficult to fully seen. Small pleural   effusions are suspected.    IMPRESSION:  Severe multifocal bilateral relatively diffuse   reticular-nodular infiltrate. Slightly asymmetric edema is not excluded.    Dictation Site: 2    Verified By: Sundra Aland, M.D., MD    Statins: Muscles aches  Amlodipine: Unknown  Beta Blockers: Unknown  Vital Signs/Nurse's Notes: **Vital Signs.:   20-Jun-14 07:59  Vital Signs Type Routine  Temperature Temperature (F) 98.2  Celsius 36.7  Temperature Source axillary  Pulse Pulse 62  Respirations Respirations 16  Systolic BP Systolic BP 295  Diastolic BP (mmHg) Diastolic BP (mmHg) 70  Mean BP 91  Pulse Ox % Pulse Ox % 96  Pulse Ox Activity Level  At rest  Oxygen Delivery 3L; Nasal Cannula    Impression 81 yo with history of paroxysmal atrial fibrillation, CKD,  moderate aortic stenosis, left MCA cardioembolic stroke, paroxysmal atrial fibrillation , profound expressive aphasia, now on coumadin,  with chronic systolic/ diastolic CHF, chronic edema, severe labile hypertension, found to have renal artery stenosis,  status post stent placement presenting with generalized edema, dyspnea and worsening renal fucntion.   1) Acute on chronic systolic/diastolic heart failure:  Most recent EF of 45% He is significantly fluid overloaded.  Agree with Lasix drip. Can consider adding Metolazone to enhance diuresis.  Hopefully, renal function  would improve with better volume status. however, if it worsens, he might require dialsyis.   2) Refractory hypertension BP is reasonably controlled. Renal artery Duplex US in 03/14 showed no significant restenosis.   3) Acute on chronic renal failure: likely cardiorenal syndrome. Nephrology consulted.   4) paroxysmal atrial fib maintaining NSR. Hold Warfarin due to elevated INR (likely related to congested liver).   5) Moderate aortic stenosis: stable.   Electronic Signatures: Fletcher Anon,  Rogue Jury (MD)  (Signed 20-Jun-14 09:55)  Authored: General Aspect/Present Illness, History and Physical Exam, Review of System, Home Medications, Labs, EKG , Radiology, Allergies, Vital Signs/Nurse's Notes, Impression/Plan   Last Updated: 20-Jun-14 09:55 by Kathlyn Sacramento (MD)

## 2014-09-22 NOTE — Consult Note (Signed)
General Aspect 81 yo with history of paroxysmal atrial fibrillation, CKD, and mild to moderate aortic stenosis, recent hospitalization for large cardioembolic stroke ([place] in 5/12 with a left MCA cardioembolic stroke),   treated with tPA, noted to be in atrial fibrillation  while in the hospital with residual profound expressive aphasia, now on coumadin,  with weight gain, worsening edema and abdominal swelling in 2012 , Severe hypertension, found to have renal artery stenosis,  status post stent placement. He presents with hypotension, dizziness.  he was recently seen in clinic for weight gain, SOB, significant edema.He was started on lasix 40 mg BID with goal of 10 lbs weighht loss. He was given 80 mg doses by family as they felt hjis sx were severe. His weight dropped from the high 220 (228) range to 217 lbs at home. He had mild dizziness at home but overall relatively asymoptomatic. He was encouraged by nursing to go to the ER. He did not want admission by the ER was concerned about the climb in his creatinine (per the family). he has had IVF oevrnight.   Left leg is always swollen secondary to previous fracture. edema has resolved in bothe legs this AM.   Blood pressure has been labile, sometimes 412 systolic, other times more than 170. He presents with family today and they attribute the labile blood pressure to stress at home. He reports that his wife has caused significant stress for him.    He has been receiving Procrit shots at hematology and reports recently hemoglobin was greater than 10. He is trying to coordinate right knee replacement surgery with [facility].    Present Illness . FAMILY HISTORY: No history of coronary artery disease.   SOCIAL HISTORY: Lives with his wife in [place]. No alcohol or tobacco use. The patient uses a cane to walk around.   Physical Exam:   GEN well developed, well nourished, no acute distress    HEENT red conjunctivae    NECK  supple  No masses    RESP normal resp effort  clear BS    CARD Regular rate and rhythm  Murmur    Murmur Systolic    Systolic Murmur Out flow    ABD denies tenderness  soft    LYMPH negative neck    EXTR negative cyanosis/clubbing, negative edema    NEURO motor/sensory function intact, aphasia    PSYCH alert, A+O to time, place, person, good insight   Review of Systems:   Subjective/Chief Complaint No complaints    General: No Complaints    Skin: No Complaints    ENT: No Complaints    Eyes: No Complaints    Neck: No Complaints    Respiratory: No Complaints    Cardiovascular: No Complaints    Gastrointestinal: No Complaints    Genitourinary: No Complaints    Vascular: No Complaints    Musculoskeletal: No Complaints    Neurologic: No Complaints  dizziness resolved    Hematologic: No Complaints    Endocrine: No Complaints    Psychiatric: No Complaints    Review of Systems: All other systems were reviewed and found to be negative    Medications/Allergies Reviewed Medications/Allergies reviewed     hypertension:    kidney disease:    stroke:    diabetes [place]:    hypercholesterolemia:    back surgery:        Admit Diagnosis:   DEHYDRATION: 13-Oct-2011, Active, DEHYDRATION  Home Medications: Medication Instructions Status  furosemide 40  mg oral tablet 1 tab(s) orally 2 times a day Active  acetaminophen-hydrocodone 500 mg-10 mg oral tablet 1 tab(s) orally every 6 hours, As Needed- for Pain  Active  acetaminophen-hydrocodone 325 mg-5 mg oral tablet 1-2 tab(s) orally every 6 hours, As Needed- for Pain  Active  isosorbide mononitrate 60 mg oral tablet, extended release 2 tab(s) orally 2 times a day Active  gemfibrozil 600 mg oral tablet 1 tab(s) orally 2 times a day Active  hydrALAZINE 100 mg oral tablet 1.5 tab(s) orally 4 times a day Active  amlodipine 5 mg oral tablet 1 tab(s) orally once a day Active  red yeast rice 600 mg oral capsule  2 cap(s) orally once a day Active  clonidine 0.3 mg oral tablet 1 tab(s) orally 3 times a day Active  furosemide 20 mg oral tablet 1 tab(s) orally once a day, As Needed for swelling Active  diazepam 5 mg oral tablet 1 tab(s) orally 2 times a day, As Needed for anxiety Active  enalapril 5 mg oral tablet 1 tab(s) orally once a day Active  warfarin 5 mg oral tablet 1 tab(s) orally as directed Active  docusate sodium 100 mg oral capsule 1 cap(s) orally , As Needed Active     Routine Chem:  15-May-13 04:05    Glucose, Serum 125   BUN 48   Creatinine (comp) 2.60   Sodium, Serum 142   Potassium, Serum 3.5   Chloride, Serum 105   CO2, Serum 27   Calcium (Total), Serum 8.6   Anion Gap 10   Osmolality (calc) 297   eGFR (African American) 26   eGFR (Non-African American) 22  Routine Coag:  15-May-13 04:05    Prothrombin 36.4   INR 3.7   EKG:   Interpretation EKG today shows sinus bradycardia with rate of 64 bpm, 1st degree AV block, RBBB    Statins: Muscles aches  Vital Signs/Nurse's Notes: **Vital Signs.:   15-May-13 07:41   Vital Signs Type Routine   Temperature Temperature (F) 98.4   Celsius 36.8   Temperature Source oral   Pulse Pulse 60   Pulse source per Dinamap   Respirations Respirations 20   Systolic BP Systolic BP 366   Diastolic BP (mmHg) Diastolic BP (mmHg) 71   Mean BP 109   BP Source Dinamap   Pulse Ox % Pulse Ox % 94   Pulse Ox Activity Level  At rest   Oxygen Delivery Room Air/ 21 %     Impression 81 yo with history of paroxysmal atrial fibrillation, CKD, and mild to moderate aortic stenosis, recent hospitalization for large cardioembolic stroke ([place] in 5/12 with a left MCA cardioembolic stroke),   treated with tPA, noted to be in atrial fibrillation  while in the hospital with residual profound expressive aphasia, now on coumadin,  with weight gain, worsening edema and abdominal swelling in 2012 , Severe hypertension, found to have renal artery  stenosis,  status post stent placement. He presents with hypotension, dizziness.  1) Hypotension/dizziness Overdiuresis in the past week on lasix BID (he took extra doses at home as well, 80 mg x 2) with 10 lb weight drop. Ideal weight is porbably in the low to mid 220 range. --IVF suspended this AM. Feels well. --Would hold lasix today/tomorrow --Lasix 20 mg prn based on his weight. Would take lasix for weight greater than 223 -225 --Patient insists on going home  2) HTN: labile, chronic issue. Would continue outpt regimen He has learned to  titrate select medications at home. H/o renal artery stenosis, stent placement  3) Renal dysfunction: Acute on chronic, likely from recent diuresis. Improving today. Hold lasix x at least 2 days. then go by weight,. Follow up with Dr. Candiss Norse, renal  4) Murmur: mild to moderate aortic valve stenosis  5) h/o CVA h/o atrial fibrillation in NSR currently, on warfarin. INR 3   Electronic Signatures: Ida Rogue (MD)  (Signed 15-May-13 09:26)  Authored: General Aspect/Present Illness, History and Physical Exam, Review of System, Past Medical History, Health Issues, Home Medications, Labs, EKG , Allergies, Vital Signs/Nurse's Notes, Impression/Plan   Last Updated: 15-May-13 09:26 by Ida Rogue (MD)

## 2014-09-22 NOTE — Discharge Summary (Signed)
PATIENT NAME:  Scott Norman, Scott Norman MR#:  [id number] DATE OF BIRTH:  [birth year]  DATE OF ADMISSION:  10/12/2011 DATE OF DISCHARGE:  10/13/2011  ADMISSION DIAGNOSES:  1. Orthostatic hypotension.  2. Dehydration.   DISCHARGE DIAGNOSES:  1. Orthostatic hypotension secondary to dehydration from Lasix overdiuresis.  2. Acute on chronic renal failure.  3. Supratherapeutic INR.  4. Old cerebrovascular accident.  5. Hypertension.   CONSULTATION: Dr. Mariah MillingGollan.   LABORATORY, DIAGNOSTIC AND RADIOLOGICAL DATA: Pertinent laboratory at discharge: Sodium 142, potassium 3.5, chloride 105, bicarbonate 27, BUN 48, creatinine 2.60, glucose 125, INR 3.7.   HOSPITAL COURSE: 81 year old male who presented with orthostatic hypotension. For further details, please refer to Dr. Jearl KlinefelterSona Norman's history and physical.  1. Orthostatic hypotension secondary to dehydration from overdiuresis with Lasix. Patient is no longer orthostatic. He received some gentle hydration.  2. Acute on chronic renal failure from overdiuresis with Lasix. He accidentally was taking too much Lasix leading to acute on chronic renal failure. His creatinine is still elevated, however, I spoke with Dr. Mariah MillingGollan. They will repeat the labs in two days. Will hold any nephrotoxic agents including his ACE inhibitor and Lasix. 3. Supratherapeutic INR. INR is 3.7 at discharge. Will hold Coumadin until further evaluation in two days with another INR at Dr. Windell HummingbirdGollan's office.  4. Old cerebrovascular accident. On aspirin.   5. Hypertension. Blood pressure is slightly elevated, however, he always has some elevation in his blood pressure. Per Dr. Mariah MillingGollan do not change any medications at this point. Medications can be titrated up as an outpatient.   DISCHARGE MEDICATIONS:  1. Acetaminophen hydrocodone 500/10 q.6 hours p.r.n.  2. Imdur 60 mg 2 tablets b.i.d.  3. Gemfibrozil 600 mg b.i.d.  4. Hydralazine 100 mg 1-1/2 tablets q.i.d.  5. Norvasc 5 mg daily.  6. Red yeast 600  mg 2 capsules daily.  7. Clonidine 0.3 mg t.i.d.  8. Diazepam 5 mg b.i.d. p.r.n.  9. Docusate 100 mg daily.  10. Aspirin 81 mg daily.  11. Do not take Lasix, enalapril and Coumadin until seen by Dr. Mariah MillingGollan.   DISCHARGE DIET: Low sodium.   DISCHARGE ACTIVITY: As tolerated.   DISCHARGE FOLLOW UP: Patient will follow up with Dr. Mariah MillingGollan in two days for an INR and a BMP.   TIME SPENT: 35 minutes.  ____________________________ Janyth ContesSital P. Juliene PinaMody, MD spm:cms D: 10/13/2011 12:18:55 ET T: 10/13/2011 12:50:56 ET JOB#: [id number]  cc: Agusta Hackenberg P. Juliene PinaMody, MD, <Dictator> Duncan Dulleresa Tullo, MD Antonieta Ibaimothy J. Gollan, MD  Janyth ContesSITAL P Faris Coolman MD ELECTRONICALLY SIGNED 10/13/2011 14:20

## 2014-09-22 NOTE — H&P (Signed)
PATIENT NAME:  Scott Norman, Scott Norman MR#:  [id number] DATE OF BIRTH:  [birth year]  DATE OF ADMISSION:  10/12/2011  PRIMARY CARE PHYSICIAN: Duncan Dull, MD   CARDIOLOGIST: Julien Nordmann, MD   NEPHROLOGIST: Mady Haagensen, MD   CHIEF COMPLAINT: Poorly controlled blood pressure and weakness.   HISTORY OF PRESENT ILLNESS: Scott Norman is a 81 year old Caucasian gentleman with multiple medical problems brought into the Emergency Room by daughters who give most of the history. The patient currently is upset because he is being admitted to the hospital and does not give me much history or review of systems. He did answer some of the questions. Most of the history is obtained from daughters.   Scott Norman has history of hypertension which has been difficult to control. He is on multiple medications. He has history of large left MCA territory thromboembolic stroke in May of 2012. He comes to the Emergency Room feeling weak and blood pressure per recordings at home has been on the lower side, systolic 80's and 90's. When the patient arrived his blood pressure was 198/84. Since arrival in the Emergency Room, the patient's blood pressure has been on the higher side. His orthostatic vitals are positive for orthostatic hypotension. He appears clinically dehydrated. He has not been eating and drinking well for the last couple of days per daughter. The patient's creatinine is up to 2.87. His baseline creatinine is around 1.7. He is being admitted for further evaluation and management.   PAST MEDICAL HISTORY:  1. Mild to moderate aortic stenosis.  2. CKD, stage III.  3. Paroxysmal atrial fibrillation, on Coumadin.  4. 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  5. Leg edema and weight gain, started on Lasix recently as outpatient.  6. Status post right-sided renal artery stent placement secondary to renal artery stenosis in November of 2012, noted to have  severe hypertension at that time.  7. Type II diabetes.  8. Hyperlipidemia.  9. Degenerative disk disease.  10. Bilateral severe degenerative joint disease in both the knees.   ALLERGIES: Statins.   MEDICATIONS:  1. Acetaminophen/hydrocodone 500/10 one tablet every six hours.  2. Amlodipine 5 mg daily.  3. Clonidine 0.3 mg 3 times a day.  4. Diazepam 5 mg twice a day as needed for anxiety.  5. Docusate 100 mg p.o. daily as needed.  6. Enalapril 5 mg p.o. daily. This was started by Dr. Cherylann Ratel about a week ago.  7. Lasix 20 mg as needed for swelling.  8. Lasix 40 mg b.i.d. This was started recently by Dr. Mariah Milling for weight gain and leg edema.  9. Gemfibrozil 600 mg b.i.d.  10. Hydralazine 150 mg 4 times a day.  11. Isosorbide mononitrate 60 mg extended-release 2 tablets twice a day.  12. Red Yeast Rice 600 mg 2 capsules once a day.  13. Warfarin 5 mg on Sunday and Thursday; 7.5 mg Saturday, Monday, Tuesday, Wednesday, and Friday.   FAMILY HISTORY: No history of coronary artery disease.   SOCIAL HISTORY: Lives with his wife in [place]. No alcohol or tobacco use. The patient uses a cane to walk around.   REVIEW OF SYSTEMS: Not much obtainable. The patient is upset since he is being admitted. He denies any chest pain, shortness of breath. He denies any cough, wheeze, or palpitations. Again, I cannot obtain review of systems since the patient is not wanting to talk much with me since he is upset he is being admitted.  PHYSICAL EXAMINATION: Again, limited.   VITAL SIGNS: Temperature 98, pulse 57, respirations 20, blood pressure 198/87, sats are 97% on room air.   HEENT: Atraumatic, normocephalic. Pupils equal, round, and reactive to light and accommodation. Extraocular movements intact. Oral mucosa is dry.   NECK: Supple. No JVD. No carotid bruit.   RESPIRATORY: Clear to auscultation bilaterally. No rales, rhonchi, respiratory distress, or labored breathing.   CARDIOVASCULAR: Both  the heart sounds are normal. Rate and rhythm is regular. PMI not lateralized. Chest nontender. Good pedal pulses. Good femoral pulses. No lower extremity edema.   ABDOMEN: Soft, benign, nontender. No organomegaly. Normal bowel sounds.   NEUROLOGIC: The patient has mild expressive aphasia and right-sided weakness from previous stroke. Reflexes are 1+ in both upper and lower extremities. Gait deferred. Strength is 4+ in both upper and lower extremities.   SKIN: Warm and dry.   PSYCHIATRIC: The patient is awake, alert, and oriented x3. His affect is flat. He is frustrated and angry at this time.   LABORATORY, DIAGNOSTIC, AND RADIOLOGICAL DATA: Glucose 126, BUN 53, creatinine 2.87, sodium 141, potassium 3.4, chloride 103, bicarb 28, calcium 8.7, hemoglobin and hematocrit 9.8 and 29.8, white count 7.6. Cardiac enzymes are negative. PT-INR 46.4 and 5.1.   EKG shows sinus rhythm sinus rhythm with first degree AV block with PACs.    ASSESSMENT: 81 year old Scott Norman with:  1. Orthostatic hypotension suspected from dehydration. Although the patient's blood pressure is elevated in the Emergency Room, his orthostatic vitals are positive. The patient's BP has been labile in the ER. He has orthostasis. He is on blood pressure meds with additional Lasix and enalapril in the last few days with poor p.o. intake.  2. Acute on chronic CKD stage III. Baseline creatinine is around 1.7, creatinine today is 2.87, looks clinically dehydrated.  3. Supratherapeutic INR, on Coumadin for atrial fibrillation.  4. History of left MCA thromboembolic CVA with mild dysarthria and hemiparesis which are late effects of CVA.  5. Severe bilateral degenerative joint disease.  6. Bilateral RAS status post right renal artery stent in November 2012.  7. Type II diabetes, diet controlled.   PLAN:  1. Admit patient for overnight observation.  2. FULL CODE.  3. 2 gram sodium, ADA 1800 calorie diet.  4. IV fluids for hydration.   5. Will monitor creatinine closely.  6. I will hold off on Lasix and enalapril given elevated creatinine.  7. Continue rest of the home blood pressure medications.  8. Sliding scale insulin.  9. Will monitor PT-INR closely and adjust Coumadin.  10. Dr. Mariah MillingGollan to see the patient tomorrow.  11. If creatinine continues to rise, then consider Nephrology consultation.  12. DVT prophylaxis. The patient's INR is supratherapeutic.   Further work-up according to the patient's clinical course. Hospital admission plan was discussed with the patient and the patient's daughter.   TIME SPENT: 45 minutes.   ____________________________ Wylie HailSona A. Allena KatzPatel, MD sap:drc D: 10/12/2011 20:51:00 ET T: 10/13/2011 06:58:50 ET JOB#: [id number]  cc: Pearce Littlefield A. Allena KatzPatel, MD, <Dictator> Duncan Dulleresa Tullo, MD Antonieta Ibaimothy J. Gollan, MD Munsoor Lizabeth LeydenN. Lateef, MD Willow OraSONA A Samia Kukla MD ELECTRONICALLY SIGNED 10/16/2011 16:53
